# Patient Record
Sex: Male | Born: 1945
Health system: Southern US, Community
[De-identification: ages and names within clinical notes are randomized; demographics above are authoritative.]

## PROBLEM LIST (undated history)

## (undated) DIAGNOSIS — T827XXA Infection and inflammatory reaction due to other cardiac and vascular devices, implants and grafts, initial encounter: Secondary | ICD-10-CM

## (undated) DIAGNOSIS — I5022 Chronic systolic (congestive) heart failure: Secondary | ICD-10-CM

## (undated) DIAGNOSIS — I428 Other cardiomyopathies: Secondary | ICD-10-CM

## (undated) DIAGNOSIS — I10 Essential (primary) hypertension: Secondary | ICD-10-CM

## (undated) DIAGNOSIS — E119 Type 2 diabetes mellitus without complications: Secondary | ICD-10-CM

## (undated) DIAGNOSIS — I447 Left bundle-branch block, unspecified: Secondary | ICD-10-CM

## (undated) DIAGNOSIS — E785 Hyperlipidemia, unspecified: Secondary | ICD-10-CM

## (undated) DIAGNOSIS — J189 Pneumonia, unspecified organism: Secondary | ICD-10-CM

## (undated) DIAGNOSIS — H9191 Unspecified hearing loss, right ear: Secondary | ICD-10-CM

## (undated) DIAGNOSIS — I2510 Atherosclerotic heart disease of native coronary artery without angina pectoris: Secondary | ICD-10-CM

## (undated) DIAGNOSIS — I4891 Unspecified atrial fibrillation: Secondary | ICD-10-CM

## (undated) HISTORY — PX: OTHER SURGICAL HISTORY: SHX169

## (undated) HISTORY — DX: Infection and inflammatory reaction due to other cardiac and vascular devices, implants and grafts, initial encounter: T82.7XXA

## (undated) HISTORY — PX: CARDIAC DEFIBRILLATOR PLACEMENT: SHX171

## (undated) HISTORY — PX: APPENDECTOMY: SHX54

## (undated) HISTORY — DX: Essential (primary) hypertension: I10

## (undated) HISTORY — DX: Left bundle-branch block, unspecified: I44.7

## (undated) HISTORY — DX: Type 2 diabetes mellitus without complications: E11.9

## (undated) HISTORY — DX: Other cardiomyopathies: I42.8

## (undated) HISTORY — PX: CHOLECYSTECTOMY: SHX55

## (undated) HISTORY — DX: Chronic systolic (congestive) heart failure: I50.22

## (undated) HISTORY — DX: Unspecified atrial fibrillation: I48.91

## (undated) HISTORY — DX: Atherosclerotic heart disease of native coronary artery without angina pectoris: I25.10

## (undated) HISTORY — PX: HERNIA REPAIR: SHX51

---

## 2007-08-05 ENCOUNTER — Ambulatory Visit: Payer: Self-pay | Admitting: Cardiovascular Disease

## 2007-08-05 ENCOUNTER — Ambulatory Visit: Payer: Self-pay | Admitting: Cardiology

## 2007-08-05 ENCOUNTER — Inpatient Hospital Stay (HOSPITAL_COMMUNITY): Admission: AD | Admit: 2007-08-05 | Discharge: 2007-08-08 | Payer: Self-pay | Admitting: Cardiology

## 2007-08-24 ENCOUNTER — Ambulatory Visit: Payer: Self-pay | Admitting: Cardiology

## 2007-09-11 ENCOUNTER — Ambulatory Visit: Payer: Self-pay | Admitting: Cardiovascular Disease

## 2007-09-11 ENCOUNTER — Ambulatory Visit (HOSPITAL_COMMUNITY): Admission: RE | Admit: 2007-09-11 | Discharge: 2007-09-11 | Payer: Self-pay | Admitting: Cardiology

## 2007-11-16 ENCOUNTER — Ambulatory Visit: Payer: Self-pay | Admitting: Cardiology

## 2007-11-16 ENCOUNTER — Encounter: Payer: Self-pay | Admitting: Cardiology

## 2007-12-10 ENCOUNTER — Ambulatory Visit: Payer: Self-pay | Admitting: Internal Medicine

## 2007-12-10 ENCOUNTER — Encounter: Payer: Self-pay | Admitting: Cardiology

## 2007-12-10 ENCOUNTER — Ambulatory Visit: Payer: Self-pay

## 2007-12-10 LAB — CONVERTED CEMR LAB
Calcium: 9.1 mg/dL (ref 8.4–10.5)
Chloride: 99 meq/L (ref 96–112)
Creatinine, Ser: 1 mg/dL (ref 0.4–1.5)
Glucose, Bld: 115 mg/dL — ABNORMAL HIGH (ref 70–99)
INR: 1.1 — ABNORMAL HIGH (ref 0.8–1.0)
Monocytes Absolute: 0.6 10*3/uL (ref 0.1–1.0)
Neutro Abs: 3.2 10*3/uL (ref 1.4–7.7)
Neutrophils Relative %: 57.7 % (ref 43.0–77.0)
Platelets: 228 10*3/uL (ref 150–400)
Potassium: 3.9 meq/L (ref 3.5–5.1)
Prothrombin Time: 13.1 s (ref 10.9–13.3)
RDW: 14.9 % — ABNORMAL HIGH (ref 11.5–14.6)
aPTT: 27.7 s (ref 21.7–29.8)

## 2007-12-14 ENCOUNTER — Ambulatory Visit: Payer: Self-pay | Admitting: Internal Medicine

## 2007-12-14 ENCOUNTER — Inpatient Hospital Stay (HOSPITAL_COMMUNITY): Admission: AD | Admit: 2007-12-14 | Discharge: 2007-12-15 | Payer: Self-pay | Admitting: Internal Medicine

## 2007-12-31 ENCOUNTER — Ambulatory Visit: Payer: Self-pay

## 2007-12-31 LAB — CONVERTED CEMR LAB
BUN: 17 mg/dL (ref 6–23)
CO2: 26 meq/L (ref 19–32)
Calcium: 8.9 mg/dL (ref 8.4–10.5)
Chloride: 99 meq/L (ref 96–112)
Creatinine, Ser: 1 mg/dL (ref 0.4–1.5)
Potassium: 4.1 meq/L (ref 3.5–5.1)

## 2008-01-07 ENCOUNTER — Ambulatory Visit: Payer: Self-pay

## 2008-01-14 ENCOUNTER — Ambulatory Visit: Payer: Self-pay | Admitting: Cardiology

## 2008-01-14 ENCOUNTER — Encounter: Payer: Self-pay | Admitting: Physician Assistant

## 2008-02-02 ENCOUNTER — Encounter: Payer: Self-pay | Admitting: Cardiology

## 2008-04-01 ENCOUNTER — Ambulatory Visit: Payer: Self-pay | Admitting: Internal Medicine

## 2008-06-20 ENCOUNTER — Ambulatory Visit: Payer: Self-pay | Admitting: Internal Medicine

## 2008-09-19 ENCOUNTER — Ambulatory Visit: Payer: Self-pay | Admitting: Internal Medicine

## 2008-11-01 ENCOUNTER — Encounter: Payer: Self-pay | Admitting: Internal Medicine

## 2008-12-19 ENCOUNTER — Ambulatory Visit: Payer: Self-pay | Admitting: Internal Medicine

## 2008-12-23 ENCOUNTER — Encounter: Payer: Self-pay | Admitting: Internal Medicine

## 2008-12-28 ENCOUNTER — Ambulatory Visit: Payer: Self-pay | Admitting: Cardiology

## 2008-12-29 ENCOUNTER — Encounter: Payer: Self-pay | Admitting: Cardiology

## 2009-01-18 ENCOUNTER — Encounter (INDEPENDENT_AMBULATORY_CARE_PROVIDER_SITE_OTHER): Payer: Self-pay | Admitting: *Deleted

## 2009-02-14 ENCOUNTER — Telehealth (INDEPENDENT_AMBULATORY_CARE_PROVIDER_SITE_OTHER): Payer: Self-pay | Admitting: *Deleted

## 2009-03-28 DIAGNOSIS — I5022 Chronic systolic (congestive) heart failure: Secondary | ICD-10-CM

## 2009-03-28 DIAGNOSIS — I2510 Atherosclerotic heart disease of native coronary artery without angina pectoris: Secondary | ICD-10-CM | POA: Insufficient documentation

## 2009-03-28 DIAGNOSIS — I447 Left bundle-branch block, unspecified: Secondary | ICD-10-CM

## 2009-03-28 DIAGNOSIS — I420 Dilated cardiomyopathy: Secondary | ICD-10-CM

## 2009-03-31 ENCOUNTER — Ambulatory Visit: Payer: Self-pay | Admitting: Internal Medicine

## 2009-03-31 DIAGNOSIS — E119 Type 2 diabetes mellitus without complications: Secondary | ICD-10-CM

## 2009-03-31 DIAGNOSIS — I4891 Unspecified atrial fibrillation: Secondary | ICD-10-CM | POA: Insufficient documentation

## 2009-04-27 ENCOUNTER — Ambulatory Visit: Admission: RE | Admit: 2009-04-27 | Discharge: 2009-04-27 | Payer: Self-pay | Admitting: Family Medicine

## 2009-05-03 ENCOUNTER — Ambulatory Visit (HOSPITAL_COMMUNITY): Admission: RE | Admit: 2009-05-03 | Discharge: 2009-05-03 | Payer: Self-pay | Admitting: Family Medicine

## 2009-05-03 ENCOUNTER — Ambulatory Visit: Payer: Self-pay | Admitting: Cardiology

## 2009-05-04 ENCOUNTER — Ambulatory Visit: Payer: Self-pay | Admitting: Vascular Surgery

## 2009-05-09 ENCOUNTER — Telehealth: Payer: Self-pay | Admitting: Internal Medicine

## 2009-07-24 ENCOUNTER — Encounter: Payer: Self-pay | Admitting: Cardiology

## 2009-08-25 ENCOUNTER — Ambulatory Visit: Payer: Self-pay | Admitting: Cardiology

## 2009-08-25 ENCOUNTER — Ambulatory Visit: Payer: Self-pay | Admitting: Internal Medicine

## 2009-08-25 DIAGNOSIS — R413 Other amnesia: Secondary | ICD-10-CM

## 2009-10-27 ENCOUNTER — Ambulatory Visit: Payer: Self-pay | Admitting: Internal Medicine

## 2010-04-23 ENCOUNTER — Encounter (INDEPENDENT_AMBULATORY_CARE_PROVIDER_SITE_OTHER): Payer: Self-pay | Admitting: *Deleted

## 2010-06-19 ENCOUNTER — Ambulatory Visit: Payer: Self-pay | Admitting: Cardiology

## 2010-06-22 ENCOUNTER — Encounter: Payer: Self-pay | Admitting: Cardiology

## 2010-07-25 ENCOUNTER — Ambulatory Visit: Payer: Self-pay | Admitting: Internal Medicine

## 2010-08-09 ENCOUNTER — Telehealth (INDEPENDENT_AMBULATORY_CARE_PROVIDER_SITE_OTHER): Payer: Self-pay | Admitting: *Deleted

## 2010-09-04 NOTE — Cardiovascular Report (Signed)
Summary: Office Visit   Office Visit   Imported By: Roderic Ovens 11/10/2009 13:24:55  _____________________________________________________________________  External Attachment:    Type:   Image     Comment:   External Document

## 2010-09-04 NOTE — Assessment & Plan Note (Signed)
Summary: 6 MO FU PER DEC REMINDER-SRS   Visit Type:  Follow-up Primary Provider:  Neita Carp  CC:  follow-up visit.  History of Present Illness: the patient is a 65 year old male with a nonischemic cardiomyopathy. The patient is status post CRT-P implantation. During his last clinic visit in the electrophysiology clinic interrogation of the device revealed episodes of atrial fibrillation. However today, no episodes of atrial fibrillation were recorded on device interrogation. The patient also reported previously tingling in left arm weakness. Dr. Graciela Husbands was concerned the patient had a TIA and mentioned that he may need Coumadin. However no further action was taken latter recommendation. The patient was seen by neurology and CT scan of the brain was normal as well as normal carotid Dopplers. The patient's status overall doing well. Is in NYHA class IIb. He does get fatigued and complains off a decrease in memory. He denies any palpitations, presyncope or syncope.  Preventive Screening-Counseling & Management  Alcohol-Tobacco     Smoking Status: quit     Year Quit: 1967  Clinical Review Panels:  Echocardiogram Echocardiogram   1. Left ventricle: The cavity size was normal. Wall thickness was      normal. Systolic function was moderately to severely reduced. The      estimated ejection fraction was in the range of 30% to 35%.      Global hypokinesis. Doppler parameters are consistent with      abnormal left ventricular relaxation (grade 1 diastolic      dysfunction).   2. Aortic valve: There was no stenosis.   3. Mitral valve: Mild regurgitation.   4. Left atrium: The atrium was mildly dilated.   5. Right ventricle: The cavity size was normal. Pacer wire or      catheter noted in right ventricle. Systolic function was normal.   6. Pulmonary arteries: No complete TR doppler jet, unable to      estimate PA systolic pressure.   7. Inferior vena cava: The vessel was normal in size; the  respirophasic diameter changes were in the normal range (= 50%);      findings are consistent with normal central venous pressure.   Impressions:    - The patient was in normal sinus rhythm. Normal LV size with     moderate to severe systolic dysfunction, EF 30-35%. There is     diffuse global hypokinesis. This appears to be an improvement from     2009. The RV appears normal. (05/03/2009)  Cardiac Imaging Cardiac Cath Findings  CONCLUSION:  Severe nonischemic cardiomyopathy.  Mild coronary plaque.   Mildly elevated pulmonary pressures with RV pressures being relatively   normal.  There was no evidence on the RV tracing of a restrictive   physiology.      PLAN:  The patient will have medical management.  Will continue to look   for etiologies of his nonischemic cardiomyopathy.  Will consider MRI and   thyroid and studies.      Rollene Rotunda, MD, [facility]  (08/07/2007)    Current Medications (verified): 1)  Lisinopril 5 Mg Tabs (Lisinopril) .... Take One Tablet By Mouth Daily 2)  Digoxin 0.125 Mg Tabs (Digoxin) .... Take One Tablet By Mouth Daily 3)  Simvastatin 40 Mg Tabs (Simvastatin) .... Take One Tablet By Mouth Daily At Bedtime 4)  Aspir-Trin 325 Mg Tbec (Aspirin) .... Take 1 Tablet By Mouth Once A Day 5)  Carvedilol 25 Mg Tabs (Carvedilol) .... Take One Tablet By Mouth Twice A Day 6)  Lasix 40 Mg Tabs (Furosemide) .... Take 1/2 Tablet By Mouth Twice A Day  Allergies (verified): 1)  ! Pcn 2)  ! * Tetanus 3)  Spironolactone (Spironolactone)  Comments:  Nurse/Medical Assistant: The patient's medications and allergies were reviewed with the patient and were updated in the Medication and Allergy Lists. Bottles reviewed.  Past History:  Past Surgical History: Last updated: 03/28/2009 Hernia Fiscula Hearing Cholecystectomy ICD - 12/14/07 - [employer] Jude Promote RF 3207 ICD - for CHF  Family History: Last updated: 03/28/2009 Family History of Cancer:  Family History of  Coronary Artery Disease:  Family History of Diabetes:   Social History: Last updated: 03/28/2009 Married  Tobacco Use - Former.  Alcohol Use - no Regular Exercise - no Drug Use - no Retired   Risk Factors: Smoking Status: quit (08/25/2009)  Past Medical History: Reviewed history from 03/28/2009 and no changes required. CAD, NATIVE VESSEL (ICD-414.01) SYSTOLIC HEART FAILURE, CHRONIC (ICD-428.22) LBBB (ICD-426.3) CARDIOMYOPATHY, SECONDARY (ICD-425.9)    Review of Systems  The patient denies fatigue, malaise, fever, weight gain/loss, vision loss, decreased hearing, hoarseness, chest pain, palpitations, shortness of breath, prolonged cough, wheezing, sleep apnea, coughing up blood, abdominal pain, blood in stool, nausea, vomiting, diarrhea, heartburn, incontinence, blood in urine, muscle weakness, joint pain, leg swelling, rash, skin lesions, headache, fainting, dizziness, depression, anxiety, enlarged lymph nodes, easy bruising or bleeding, and environmental allergies.         memory loss left arm numbness.  Vital Signs:  Patient profile:   65 year old male Height:      72 inches Weight:      225 pounds Pulse rate:   72 / minute BP sitting:   102 / 69  (left arm) Cuff size:   large  Vitals Entered By: Carlye Grippe (August 25, 2009 9:07 AM) CC: follow-up visit   Physical Exam  Additional Exam:  General: Well-developed, well-nourished in no distress head: Normocephalic and atraumatic eyes PERRLA/EOMI intact, conjunctiva and lids normal nose: No deformity or lesions mouth normal dentition, normal posterior pharynx neck: Supple, no JVD.  No masses, thyromegaly or abnormal cervical nodes. No carotid bruits. lungs: Normal breath sounds bilaterally without wheezing.  Normal percussion heart: regular rate and rhythm with normal S1 and S2, no S3 or S4.  PMI is normal.  No pathological murmurs abdomen: Normal bowel sounds, abdomen is soft and nontender without masses,  organomegaly or hernias noted.  No hepatosplenomegaly musculoskeletal: Back normal, normal gait muscle strength and tone normal pulsus: Pulse is normal in all 4 extremities Extremities: No peripheral pitting edema neurologic: Alert and oriented x 3 skin: Intact without lesions or rashes cervical nodes: No significant adenopathy psychologic: Normal affect    Echocardiogram  Procedure date:  05/03/2009  Findings:        1. Left ventricle: The cavity size was normal. Wall thickness was      normal. Systolic function was moderately to severely reduced. The      estimated ejection fraction was in the range of 30% to 35%.      Global hypokinesis. Doppler parameters are consistent with      abnormal left ventricular relaxation (grade 1 diastolic      dysfunction).   2. Aortic valve: There was no stenosis.   3. Mitral valve: Mild regurgitation.   4. Left atrium: The atrium was mildly dilated.   5. Right ventricle: The cavity size was normal. Pacer wire or      catheter noted in right ventricle. Systolic function  was normal.   6. Pulmonary arteries: No complete TR doppler jet, unable to      estimate PA systolic pressure.   7. Inferior vena cava: The vessel was normal in size; the      respirophasic diameter changes were in the normal range (= 50%);      findings are consistent with normal central venous pressure.   Impressions:    - The patient was in normal sinus rhythm. Normal LV size with     moderate to severe systolic dysfunction, EF 30-35%. There is     diffuse global hypokinesis. This appears to be an improvement from     2009. The RV appears normal.    ICD Specifications Following MD:  Sherryl Manges, MD     ICD Vendor:  [facility] Jude     ICD Model Number:  [id number]     ICD Serial Number:  [id number] ICD DOI:  12/14/2007     ICD Implanting MD:  Sherryl Manges, MD  Lead 1:    Location: RA     DOI: 12/14/2007     Model #: [id number]     Serial #: [id number]     Status: active Lead 2:     Location: RV     DOI: 12/14/2007     Model #: 2956     Serial #: [id number]     Status: active Lead 3:    Location: LV     DOI: 12/14/2007     Model #: [id number]     Serial #: [id number]     Status: active  Indications::  NICM, CHF   ICD Follow Up ICD Dependent:  No       ICD Device Measurements Configuration: LV TIP-RV COIL  Episodes Coumadin:  No  Brady Parameters Mode DDD     Lower Rate Limit:  60     Upper Rate Limit 120 PAV 160     Sensed AV Delay:  110  Tachy Zones VF:  240     VT:  200     Impression & Recommendations:  Problem # 1:  ATRIAL FIBRILLATION, PAROXYSMAL (ICD-427.31) no further episodes of atrial fibrillation on device interrogation. No clear indication for Coumadin at the present time particularly absence of any cardioembolic events. His updated medication list for this problem includes:    Digoxin 0.125 Mg Tabs (Digoxin) .Marland Kitchen... Take one tablet by mouth daily    Aspir-trin 325 Mg Tbec (Aspirin) .Marland Kitchen... Take 1 tablet by mouth once a day    Carvedilol 25 Mg Tabs (Carvedilol) .Marland Kitchen... Take one tablet by mouth twice a day  Orders: EKG w/ Interpretation (93000)  Problem # 2:  MEMORY LOSS (ICD-780.93) the patient reports memory loss. He also is a sensory neuropathy. Both could be explained by the use of statin drugs. We'll recommend to hold simvastatin for several weeks, particularly because the patient does not have an ischemic myopathy.and benefit from statin drug therapy is minimal in this situation.  Problem # 3:  SYSTOLIC HEART FAILURE, CHRONIC (ICD-428.22) the patient has no evidence of volume overload. He can continue his current medical regimen. The patient's also requiring a small dose of Lasix. His ejection fraction is 30-35% from 15-20%. It's unclear is an improvement secondary to CRT or optimal drug management. His updated medication list for this problem includes:    Lisinopril 5 Mg Tabs (Lisinopril) .Marland Kitchen... Take one tablet by mouth daily    Digoxin 0.125 Mg Tabs  (Digoxin) .Marland Kitchen... Take one tablet by  mouth daily    Aspir-trin 325 Mg Tbec (Aspirin) .Marland Kitchen... Take 1 tablet by mouth once a day    Carvedilol 25 Mg Tabs (Carvedilol) .Marland Kitchen... Take one tablet by mouth twice a day    Lasix 40 Mg Tabs (Furosemide) .Marland Kitchen... Take 1/2 tablet by mouth twice a day  Problem # 4:  CRT-D STJ (ICD-V45.02)  Patient Instructions: 1)  Your physician recommends that you continue on your current medications as directed. Please refer to the Current Medication list given to you today 2)  Follow up in 1 year.  Prescriptions: LASIX 40 MG TABS (FUROSEMIDE) Take 1/2 tablet by mouth twice a day  #180 x 3   Entered by:   Hoover Brunette, LPN   Authorized by:   Lewayne Bunting, MD, [facility]   Signed by:   Hoover Brunette, LPN on 09/81/1914   Method used:   Electronically to        [facility]* (mail-order)             ,          Ph: [phone]       Fax: [phone]   RxID:   [id number] CARVEDILOL 25 MG TABS (CARVEDILOL) Take one tablet by mouth twice a day  #180 x 3   Entered by:   Hoover Brunette, LPN   Authorized by:   Lewayne Bunting, MD, [facility]   Signed by:   Hoover Brunette, LPN on 53/66/4403   Method used:   Electronically to        [facility]* (mail-order)             ,          Ph: [phone]       Fax: [phone]   RxID:   [id number] SIMVASTATIN 40 MG TABS (SIMVASTATIN) Take one tablet by mouth daily at bedtime  #90 x 3   Entered by:   Hoover Brunette, LPN   Authorized by:   Lewayne Bunting, MD, [facility]   Signed by:   Hoover Brunette, LPN on 32/35/5732   Method used:   Electronically to        [facility]* (mail-order)             ,          Ph: [phone]       Fax: [phone]   RxID:   [id number] DIGOXIN 0.125 MG TABS (DIGOXIN) Take one tablet by mouth daily  #90 x 3   Entered by:   Hoover Brunette, LPN   Authorized by:   Lewayne Bunting, MD, [facility]   Signed by:   Hoover Brunette, LPN on 46/27/0350   Method used:   Electronically to        [facility]* (mail-order)             ,           Ph: [phone]       Fax: [phone]   RxID:   [id number] LISINOPRIL 5 MG TABS (LISINOPRIL) Take one tablet by mouth daily  #90 x 3   Entered by:   Hoover Brunette, LPN   Authorized by:   Lewayne Bunting, MD, [facility]   Signed by:   Hoover Brunette, LPN on 42/35/3614   Method used:   Electronically to        [facility] (mail-order)             ,          Ph:  [phone]       Fax: [phone]   RxID:   [id number]    Appended Document: 6 MO FU PER DEC REMINDER-SRS Gayle,could you call patient and let him know to stop simvastatin for couple of weeks and see if his memory loss gets better.also, his numbness may get better. He needs to call back in a couple of weeks and let us know, per Dr. Andee Lineman.  Appended Document: 6 MO FU PER DEC REMINDER-SRS Pt. and wife notified.

## 2010-09-04 NOTE — Medication Information (Signed)
Summary: RX Folder/ FAXED LISINOPRIL  RX Folder/ FAXED LISINOPRIL   Imported By: Dorise Hiss 06/26/2010 15:26:23  _____________________________________________________________________  External Attachment:    Type:   Image     Comment:   External Document

## 2010-09-04 NOTE — Assessment & Plan Note (Signed)
Summary: needs to discuss meds   Visit Type:  Follow-up Primary Caylor Cerino:  Sasser   History of Present Illness: the patient is a 65 year old male with a nonischemic cardiomyopathy. The patient is status post CRT-P implantation. During his last clinic visit in the electrophysiology clinic interrogation of the device revealed episodes of atrial fibrillation. Low risk for tghromboembolic disease.  patient had L. statin therapy but did not notice any difference and so he has restarted back on this. Is on a good heart failure regimen.EKG shows normal sinus rhythm with biventricular pacing. Still tires easily. Takes a nap once in a while. Feels has better blood flow.device check in December. Has diabetes. Will start on metformin. BS very high. On salt substitute.  Snore louds. Stops breathing sometimes.  Refuses sleep study.  Preventive Screening-Counseling & Management  Alcohol-Tobacco     Smoking Status: quit     Year Quit: 1967  Current Medications (verified): 1)  Lisinopril 10 Mg Tabs (Lisinopril) .... Take 1 Tablet By Mouth Once A Day 2)  Digoxin 0.125 Mg Tabs (Digoxin) .... Take One Tablet By Mouth Daily 3)  Simvastatin 40 Mg Tabs (Simvastatin) .... Take One Tablet By Mouth Daily At Bedtime 4)  Aspir-Trin 81 Mg Tbec (Aspirin) .... Take 1 Tablet By Mouth Once A Day 5)  Carvedilol 25 Mg Tabs (Carvedilol) .... Take One Tablet By Mouth Twice A Day 6)  Lasix 40 Mg Tabs (Furosemide) .... Take 1/2 Tablet By Mouth Twice A Day 7)  [facility] Cinnamon 500 Mg Caps (Cinnamon) .... Take 2 Tablet By Mouth Once A Day 8)  Vitamin B-12 500 Mcg Tabs (Cyanocobalamin) .... Take 1 Tablet By Mouth Once A Day  Allergies (verified): 1)  ! Pcn  Comments:  Nurse/Medical Assistant: The patient's medication bottles and allergies were reviewed with the patient and were updated in the Medication and Allergy Lists.  Past History:  Past Medical History: Last updated: 03/28/2009 CAD, NATIVE VESSEL  (ICD-414.01) SYSTOLIC HEART FAILURE, CHRONIC (ICD-428.22) LBBB (ICD-426.3) CARDIOMYOPATHY, SECONDARY (ICD-425.9)    Past Surgical History: Last updated: 03/28/2009 Hernia Fiscula Hearing Cholecystectomy ICD - 12/14/07 - [employer]. Jude Promote RF 3207 ICD - for CHF  Family History: Last updated: 03/28/2009 Family History of Cancer:  Family History of Coronary Artery Disease:  Family History of Diabetes:   Social History: Last updated: 03/28/2009 Married  Tobacco Use - Former.  Alcohol Use - no Regular Exercise - no Drug Use - no Retired   Vital Signs:  Patient profile:   65 year old male Height:      72 inches Weight:      213 pounds BMI:     28.99 Pulse rate:   79 / minute BP sitting:   120 / 80  (left arm) Cuff size:   large  Vitals Entered By: Carlye Grippe (June 19, 2010 1:09 PM)  Nutrition Counseling: Patient's BMI is greater than 25 and therefore counseled on weight management options.  Physical Exam  Additional Exam:  General: Well-developed, well-nourished in no distress head: Normocephalic and atraumatic eyes PERRLA/EOMI intact, conjunctiva and lids normal nose: No deformity or lesions mouth normal dentition, normal posterior pharynx neck: Supple, no JVD.  No masses, thyromegaly or abnormal cervical nodes. No carotid bruits. lungs: Normal breath sounds bilaterally without wheezing.  Normal percussion heart: regular rate and rhythm with normal S1 and S2, no S3 or S4.  PMI is normal.  No pathological murmurs abdomen: Normal bowel sounds, abdomen is soft and nontender without masses, organomegaly or hernias  noted.  No hepatosplenomegaly musculoskeletal: Back normal, normal gait muscle strength and tone normal pulsus: Pulse is normal in all 4 extremities Extremities: No peripheral pitting edema neurologic: Alert and oriented x 3 skin: Intact without lesions or rashes cervical nodes: No significant adenopathy psychologic: Normal affect     ICD  Specifications Following MD:  Hillis Range, MD     Referring MD:  [facility] ICD Vendor:  [employer] Jude     ICD Model Number:  [phone]     ICD Serial Number:  [id number] ICD DOI:  12/14/2007     ICD Implanting MD:  Sherryl Manges, MD  Lead 1:    Location: RA     DOI: 12/14/2007     Model #: [id number]     Serial #: [id number]     Status: active Lead 2:    Location: RV     DOI: 12/14/2007     Model #: 2956     Serial #: [id number]     Status: active Lead 3:    Location: LV     DOI: 12/14/2007     Model #: 8469     Serial #: [id number]     Status: active  Indications::  NICM, CHF   ICD Follow Up ICD Dependent:  No       ICD Device Measurements Configuration: LV TIP-RV COIL  Episodes Coumadin:  No  Brady Parameters Mode DDD     Lower Rate Limit:  60     Upper Rate Limit 120 PAV 160     Sensed AV Delay:  110  Tachy Zones VF:  240     VT:  200     Impression & Recommendations:  Problem # 1:  CRT-D STJ (ICD-V45.02) patient is status-post CRT-D.  Is doing well.  Is on a good heart regimen.  Problem # 2:  CAD, NATIVE VESSEL (ICD-414.01) no recurrent chest pain.  Continue medical therapy. His updated medication list for this problem includes:    Lisinopril 10 Mg Tabs (Lisinopril) .Marland Kitchen... Take 1 tablet by mouth once a day    Carvedilol 25 Mg Tabs (Carvedilol) .Marland Kitchen... Take one tablet by mouth twice a day  Problem # 3:  ATRIAL FIBRILLATION, PAROXYSMAL (ICD-427.31) patient had a history of paroxysmal atrial fibrillation but remains in normal sinus rhythm.  Currently no immediate indication for Coumadin His updated medication list for this problem includes:    Digoxin 0.125 Mg Tabs (Digoxin) .Marland Kitchen... Take one tablet by mouth daily    Carvedilol 25 Mg Tabs (Carvedilol) .Marland Kitchen... Take one tablet by mouth twice a day  Orders: EKG w/ Interpretation (93000)  Patient Instructions: 1)  Increase Lisinopril to 10mg  daily 2)  Follow up in  6 months Prescriptions: LISINOPRIL 10 MG TABS (LISINOPRIL) Take 1 tablet by mouth once a  day  #90 x 3   Entered by:   Hoover Brunette, LPN   Authorized by:   Lewayne Bunting, MD, [facility]   Signed by:   Hoover Brunette, LPN on 41/32/4401   Method used:   Faxed to ...       [facility]* (retail)             ,          Ph: [phone]       Fax: [phone]   RxID:   [id number] LASIX 40 MG TABS (FUROSEMIDE) Take 1/2 tablet by mouth twice a day  #90 x 3   Entered by:   Hoover Brunette, LPN  Authorized by:   Lewayne Bunting, MD, [facility]   Signed by:   Hoover Brunette, LPN on 16/05/9603   Method used:   Faxed to ...       [facility]* (retail)             ,          Ph: [phone]       Fax: [phone]   RxID:   [id number] CARVEDILOL 25 MG TABS (CARVEDILOL) Take one tablet by mouth twice a day  #180 x 3   Entered by:   Hoover Brunette, LPN   Authorized by:   Lewayne Bunting, MD, [facility]   Signed by:   Hoover Brunette, LPN on 32/44/0102   Method used:   Faxed to ...       [facility]* (retail)             ,          Ph: [phone]       Fax: [phone]   RxID:   [id number] ASPIR-TRIN 81 MG TBEC (ASPIRIN) Take 1 tablet by mouth once a day  #90 x 3   Entered by:   Hoover Brunette, LPN   Authorized by:   Lewayne Bunting, MD, [facility]   Signed by:   Hoover Brunette, LPN on 02/02/1600   Method used:   Faxed to ...       [facility]* (retail)             ,          Ph: [phone]       Fax: [phone]   RxID:   [id number] SIMVASTATIN 40 MG TABS (SIMVASTATIN) Take one tablet by mouth daily at bedtime  #90 x 3   Entered by:   Hoover Brunette, LPN   Authorized by:   Lewayne Bunting, MD, [facility]   Signed by:   Hoover Brunette, LPN on 71/01/2693   Method used:   Faxed to ...       [facility] (retail)             ,          Ph: [phone]       Fax: [phone]   RxID:   [id number] DIGOXIN 0.125 MG TABS (DIGOXIN) Take one tablet by mouth daily  #90 x 3   Entered by:   Hoover Brunette, LPN   Authorized by:   Lewayne Bunting, MD, [facility]   Signed by:   Hoover Brunette, LPN on 02/58/5277   Method used:   Faxed to ...        [facility]* (retail)             ,          Ph: [phone]       Fax: [phone]   RxID:   [id number] LISINOPRIL 5 MG TABS (LISINOPRIL) Take one tablet by mouth daily  #90 x 3   Entered by:   Hoover Brunette, LPN   Authorized by:   Lewayne Bunting, MD, [facility]   Signed by:   Hoover Brunette, LPN on 99/83/3825   Method used:   Faxed to ...       [facility] (retail)             ,          Ph: [phone]       Fax: [phone]   RxID:   [id number]

## 2010-09-04 NOTE — Letter (Signed)
Summary: Device-Delinquent Check  [facility], Main Office  [address]   [place], [place] [address]   Phone: [phone]  Fax: [phone]     April 23, 2010 MRN: [id number]   [name] [address] [place], [place]  [address]   Dear [name],  According to our records, you have not had your implanted device checked in the recommended period of time.  We are unable to determine appropriate device function without checking your device on a regular basis.  Please call our office to schedule an appointment, with Dr. Johney Frame in [place],  as soon as possible.  If you are having your device checked by another physician, please call us so that we may update our records.  Thank you, Altha Harm, LPN  April 23, 2010 10:39 AM   [facility]

## 2010-09-04 NOTE — Cardiovascular Report (Signed)
Summary: Office Visit   Office Visit   Imported By: Roderic Ovens 09/12/2009 15:38:13  _____________________________________________________________________  External Attachment:    Type:   Image     Comment:   External Document

## 2010-09-04 NOTE — Procedures (Signed)
Summary: Cardiology Device Clinic   Current Medications (verified): 1)  Lisinopril 5 Mg Tabs (Lisinopril) .... Take One Tablet By Mouth Daily 2)  Digoxin 0.125 Mg Tabs (Digoxin) .... Take One Tablet By Mouth Daily 3)  Simvastatin 40 Mg Tabs (Simvastatin) .... Take One Tablet By Mouth Daily At Bedtime 4)  Aspir-Trin 81 Mg Tbec (Aspirin) .... Take 1 Tablet By Mouth Once A Day 5)  Carvedilol 25 Mg Tabs (Carvedilol) .... Take One Tablet By Mouth Twice A Day 6)  Lasix 40 Mg Tabs (Furosemide) .... Take 1/2 Tablet By Mouth Twice A Day  Allergies: 1)  ! Pcn   ICD Specifications Following MD:  Hillis Range, MD     Referring MD:  [facility] ICD Vendor:  St Jude     ICD Model Number:  [phone]     ICD Serial Number:  [id number] ICD DOI:  12/14/2007     ICD Implanting MD:  Sherryl Manges, MD  Lead 1:    Location: RA     DOI: 12/14/2007     Model #: [id number]     Serial #: [id number]     Status: active Lead 2:    Location: RV     DOI: 12/14/2007     Model #: 2956     Serial #: [id number]     Status: active Lead 3:    Location: LV     DOI: 12/14/2007     Model #: 8469     Serial #: [id number]     Status: active  Indications::  NICM, CHF   ICD Follow Up Remote Check?  No Battery Voltage:  3.10 V     Charge Time:  11.3 seconds     Battery Est. Longevity:  3.9 years Underlying rhythm:  SR ICD Dependent:  No       ICD Device Measurements Atrium:  Amplitude: 3.9 mV, Impedance: 410 ohms, Threshold: 0.5 V at 0.5 msec Right Ventricle:  Amplitude: 12 mV, Impedance: 700 ohms, Threshold: 0.5 V at 0.5 msec Left Ventricle:  Impedance: 380 ohms, Threshold: 1.75 V at 1.0 msec Configuration: LV TIP-RV COIL Shock Impedance: 53 ohms   Episodes MS Episodes:  0     Percent Mode Switch:  0     Coumadin:  No Shock:  0     ATP:  0     Nonsustained:  0     Atrial Therapies:  18%  Brady Parameters Mode DDD     Lower Rate Limit:  60     Upper Rate Limit 120 PAV 160     Sensed AV Delay:  110  Tachy Zones VF:  240     VT:  200      Next Cardiology Appt Due:  01/03/2010 Tech Comments:  No parameter changes.  Device function normal.  The patient was holding her Simvastatin because of some numbness in his legs.  He states he hadn't noticed a difference so he started back at the same dosage.  ROV 3 months in the [facility]. Altha Harm, LPN  October 27, 2009 10:56 AM

## 2010-09-04 NOTE — Procedures (Signed)
Summary: PACER CHECK   Current Medications (verified): 1)  Lisinopril 5 Mg Tabs (Lisinopril) .... Take One Tablet By Mouth Daily 2)  Digoxin 0.125 Mg Tabs (Digoxin) .... Take One Tablet By Mouth Daily 3)  Simvastatin 40 Mg Tabs (Simvastatin) .... Take One Tablet By Mouth Daily At Bedtime 4)  Aspirin 81 Mg Tabs (Aspirin) .... Take 1 Tablet By Mouth Once A Day 5)  Carvedilol 25 Mg Tabs (Carvedilol) .... Take One Tablet By Mouth Twice A Day 6)  Lasix 40 Mg Tabs (Furosemide) .... Take 1 Tablet By Mouth Twice A Day  Allergies (verified): 1)  ! Pcn 2)  ! * Tetanus 3)  Spironolactone (Spironolactone)   ICD Specifications Following MD:  Hillis Range, MD     Referring MD:  [facility] ICD Vendor:  St Jude     ICD Model Number:  [phone]     ICD Serial Number:  [id number] ICD DOI:  12/14/2007     ICD Implanting MD:  Sherryl Manges, MD  Lead 1:    Location: RA     DOI: 12/14/2007     Model #: [id number]     Serial #: [id number]     Status: active Lead 2:    Location: RV     DOI: 12/14/2007     Model #: 2956     Serial #: [id number]     Status: active Lead 3:    Location: LV     DOI: 12/14/2007     Model #: 8469     Serial #: [id number]     Status: active  Indications::  NICM, CHF   ICD Follow Up Remote Check?  No Battery Voltage:  3.14 V     Charge Time:  10.9 seconds     Underlying rhythm:  SR ICD Dependent:  No       ICD Device Measurements Atrium:  Amplitude: 4.4 mV, Impedance: 400 ohms, Threshold: 0.5 V at 0.5 msec Right Ventricle:  Amplitude: 12 mV, Impedance: 680 ohms, Threshold: 0.5 V at 0.5 msec Left Ventricle:  Impedance: 440 ohms, Threshold: 1.25 V at 0.5 msec Configuration: LV TIP-RV COIL Shock Impedance: 52 ohms   Episodes MS Episodes:  1     Percent Mode Switch:  <1%     Coumadin:  No Shock:  0     ATP:  0     Nonsustained:  0     Atrial Pacing:  25%     Ventricular Pacing:  >99%  Brady Parameters Mode DDD     Lower Rate Limit:  60     Upper Rate Limit 120 PAV 160     Sensed AV Delay:   110  Tachy Zones VF:  240     VT:  200     Next Cardiology Appt Due:  11/03/2009 Tech Comments:  Normal device function.  No changes made today.  ROV 3 months [facility] device clinic. Gypsy Balsam RN BSN  August 25, 2009 5:41 PM

## 2010-09-06 NOTE — Cardiovascular Report (Signed)
Summary: Office Visit   Office Visit   Imported By: Roderic Ovens 08/14/2010 13:59:58  _____________________________________________________________________  External Attachment:    Type:   Image     Comment:   External Document

## 2010-09-06 NOTE — Progress Notes (Signed)
Summary: Recieving appt. notice  Phone Note Call from Patient Call back at Home Phone [phone]   Details for Reason: APPT NOTICE Summary of Call: Patient called and ask [place] to stop sending notice to make 3 mo. fullowup with [facility]. He will come once a year or when needed. Initial call taken by: Dorise Hiss,  August 09, 2010 12:28 PM  Follow-up for Phone Call        Spoke with patient.  Informed him that his appointment reminder was from his initial visit a year ago & one reminder was for that.  But, he came in November 2011 to discuss med issues & was then told to f/u in 6 months from that visit.  The one year f/u was never cancelled.  Patient sees PMD every 3 months for his Diabetes, Dr. Johney Frame periodically, and [place].  Just trying to cut down on soo many visits all the time (co-pay, etc.. )  Informed him that he was not due to see GD until May of this year.  Had pt come back in 6 months this time instead of a year because med changes were made at last OV.   States that he would like to just come once a year, unless he was having problems.  Advised pt will let MD be aware of above.   Follow-up by: Hoover Brunette, LPN,  August 10, 2010 6:05 PM  Additional Follow-up for Phone Call Additional follow up Details #1::        once a year would be fine Additional Follow-up by: Lewayne Bunting, MD, [facility],  August 13, 2010 8:34 AM    Additional Follow-up for Phone Call Additional follow up Details #2::    All reminders cancelled for GD.  New reminder created for 11/12.  Follow-up by: Hoover Brunette, LPN,  August 13, 2010 5:51 PM

## 2010-09-06 NOTE — Procedures (Signed)
Summary: dfp   Current Medications (verified): 1)  Lisinopril 10 Mg Tabs (Lisinopril) .... Take 1 Tablet By Mouth Once A Day 2)  Digoxin 0.125 Mg Tabs (Digoxin) .... Take One Tablet By Mouth Daily 3)  Simvastatin 40 Mg Tabs (Simvastatin) .... Take One Tablet By Mouth Daily At Bedtime 4)  Aspir-Trin 81 Mg Tbec (Aspirin) .... Take 1 Tablet By Mouth Once A Day 5)  Carvedilol 25 Mg Tabs (Carvedilol) .... Take One Tablet By Mouth Twice A Day 6)  Lasix 40 Mg Tabs (Furosemide) .... Take 1/2 Tablet By Mouth Twice A Day 7)  Cvs Cinnamon 500 Mg Caps (Cinnamon) .... Take 2 Tablet By Mouth Once A Day 8)  Vitamin B-12 500 Mcg Tabs (Cyanocobalamin) .... Take 1 Tablet By Mouth Once A Day  Allergies (verified): 1)  ! Pcn   ICD Specifications Following MD:  Hillis Range, MD     Referring MD:  [facility] ICD Vendor:  St Jude     ICD Model Number:  [phone]     ICD Serial Number:  [id number] ICD DOI:  12/14/2007     ICD Implanting MD:  Sherryl Manges, MD  Lead 1:    Location: RA     DOI: 12/14/2007     Model #: 1688TC     Serial #: [id number]     Status: active Lead 2:    Location: RV     DOI: 12/14/2007     Model #: 7564     Serial #: [id number]     Status: active Lead 3:    Location: LV     DOI: 12/14/2007     Model #: 4166     Serial #: [id number]     Status: active  Indications::  NICM, CHF   ICD Follow Up Battery Voltage:  2.90 V     Charge Time:  12.0 seconds     Battery Est. Longevity:  3.3 yrs Underlying rhythm:  SR ICD Dependent:  No       ICD Device Measurements Atrium:  Amplitude: 5.0 mV, Impedance: 480 ohms, Threshold: 0.5 V at 0.5 msec Right Ventricle:  Amplitude: 12.0 mV, Impedance: 790 ohms, Threshold: 0.5 V at 0.5 msec Left Ventricle:  Impedance: 540 ohms, Threshold: 1.5  V at 1.0 msec Configuration: LV TIP-RV COIL Shock Impedance: 54 ohms   Episodes MS Episodes:  1     Percent Mode Switch:  <1%     Coumadin:  No Shock:  0     ATP:  0     Nonsustained:  0     Atrial Therapies:  0 Atrial Pacing:   17%     Ventricular Pacing:  >99%  Brady Parameters Mode DDD     Lower Rate Limit:  60     Upper Rate Limit 120 PAV 160     Sensed AV Delay:  110  Tachy Zones VF:  240     VT:  200     Next Cardiology Appt Due:  10/29/2010 Tech Comments:  1 AMS EPISODE LASTING 4 SECONDS.  NORMAL DEVICE FUNCTION.  CHANGED RA OUTPUT FROM 2.5 TO 2.0 AND TURNED ON RATE RESPONSE DURING MODE SWITCH.  ROV 10-29-10 @ 1430 W/JA IN [facility]. Vella Kohler  July 25, 2010 11:20 AM

## 2010-10-13 ENCOUNTER — Encounter: Payer: Self-pay | Admitting: *Deleted

## 2010-10-29 ENCOUNTER — Encounter: Payer: Self-pay | Admitting: Internal Medicine

## 2010-11-14 ENCOUNTER — Encounter: Payer: Self-pay | Admitting: *Deleted

## 2010-11-14 ENCOUNTER — Ambulatory Visit (INDEPENDENT_AMBULATORY_CARE_PROVIDER_SITE_OTHER): Payer: BLUE CROSS/BLUE SHIELD | Admitting: *Deleted

## 2010-11-14 DIAGNOSIS — I4891 Unspecified atrial fibrillation: Secondary | ICD-10-CM

## 2010-11-14 DIAGNOSIS — I429 Cardiomyopathy, unspecified: Secondary | ICD-10-CM

## 2010-11-14 NOTE — Progress Notes (Signed)
icd check in clinic  

## 2010-12-18 NOTE — Assessment & Plan Note (Signed)
[facility]                          [facility] NOTE   [name], [name]                     MRN:          [id number]  DATE:01/14/2008                            DOB:          1946/03/12    CARDIOLOGIST:  Learta Codding, MD, [facility].   PRIMARY CARE PHYSICIAN:  Dr. Fara Chute.   REASON FOR VISIT:  25-month followup.   HISTORY OF PRESENT ILLNESS:  [name] is a very pleasant 65 year old  male patient with a history of nonischemic cardiomyopathy with an EF of  15%, who recently underwent CRT/ICD implantation by Dr. Graciela Husbands with a St.  Jude device on Dec 14, 2007.  He presents to the office today for  followup.  Overall, he is doing well.  He describes NYHA class IIB  symptoms now.  He is sleeping on just 1 pillow.  He denies PND or  significant pedal edema.  Denies chest pain, syncope, or ICD discharges.  He does continue to complain of hoarseness and occasional cough.  He  sometimes feels like it is worse if he overeats.   CURRENT MEDICATIONS:  1. Furosemide 40 mg daily.  2. Aspirin 81 mg daily.  3. Spironolactone 25 mg daily.  4. Simvastatin 40 mg nightly.  5. Lisinopril 5 mg daily.  6. Carvedilol 3.125 mg b.i.d.  7. Digoxin 0.125 mg daily.   ALLERGIES:  PENICILLIN and TETANUS.   PHYSICAL EXAMINATION:  GENERAL:  He is well nourished, well developed,  in no acute distress.  VITAL SIGNS:  Blood pressure is 108/69, pulse 86, and weight 218.8  pounds.  HEENT:  Normal.  NECK:  Without JVD.  CARDIAC:  Normal S1 and S2.  Regular rate and rhythm.  LUNGS:  Clear to auscultation bilaterally.  ABDOMEN:  Soft and nontender.  Extremities:  No edema.  NEUROLOGIC:  He is alert and oriented x3.  Cranial nerves II-XII grossly  intact.   IMPRESSION:  1. Nonischemic cardiomyopathy with an ejection fraction of 15%.  2. Status post cardiac resynchronization therapy/implantable      cardioverter-defibrillator implantation with a St. Jude  device on      Dec 14, 2007.  3. Chronic systolic congestive heart failure with New York Heart      Association class IIB symptoms.  4. Minimal nonobstructive coronary artery disease by catheterization      in June 2009.  5. Chronic left bundle-branch block.  6. Hoarseness.   PLAN:  1. The patient is doing well from cardiovascular standpoint.  He has      felt poorly in the past and we have been unable to titrate      medications for his congestive heart failure.  Now that he is to      doing much better and is status post CRT device implantation, he      should be able to tolerate some increase in his medications.  We      will increase his carvedilol to 6.25 mg b.i.d.  2. He will need a BMET to follow up on his renal function, potassium      especially given  his ACE inhibitor and spironolactone therapy.  3. The patient continues to complain of hoarseness.  I think this is      possibly related to his ACE inhibitor.  However, he is restricted      with finances and would not be able to afford an angiotensin      receptor blocker.  He has a remote history of smoking.  He also      notes some history of what sounds like acid reflux disease.  I have      recommended that we refer him to ENT for further evaluation.  He is      agreeable to this, but needs to have it done before the end of the      month secondary to his Medicaid coverage.  I have also asked him to      start on ranitidine 150 mg b.i.d.  4. The patient would like to follow up in 6 months instead of 3 months      due to his financial constraints.  He is due to see Dr. Graciela Husbands in      August.  I will let him see Dr. Graciela Husbands in August and then we can see      him in 6 months from now.      [name], PA-C  Electronically Signed      Learta Codding, MD,FACC  Electronically Signed   SW/MedQ  DD: 01/14/2008  DT: 01/15/2008  Job #: [id number]   cc:   Fara Chute

## 2010-12-18 NOTE — Discharge Summary (Signed)
NAME:  [name], [name] NO.:  [id number]   MEDICAL RECORD NO.:  [id number]          PATIENT TYPE:  INP   LOCATION:  2005                         FACILITY:  [facility]   PHYSICIAN:  Duke Salvia, MD, FACCDATE OF BIRTH:  1946-01-22   DATE OF ADMISSION:  12/14/2007  DATE OF DISCHARGE:  12/15/2007                               DISCHARGE SUMMARY   ALLERGIES:  This patient has allergy to PENICILLIN.   Dictation and exam greater than 35 minutes.   FINAL DIAGNOSES:  1. Discharging day #1, status post implant of St. Jude PROMOTE CRT - D      system.  2. Nonischemic cardiomyopathy, ejection fraction by MRI in February      2009 of 17%.  3. MRI in February 2009, ejection fraction of 17%, dilated left      ventricle, global hypokinesis, right ventricular function normal,      and no infiltrate or scar.  4. Left heart catheterization in January 2009, ejection fraction of      25%, mild coronary plaque in the left anterior descending and the      right coronary artery.  5. New York Heart Association class III, chronic systolic congestive      heart failure.  6. Left bundle-branch block.   SECONDARY DIAGNOSES:  1. Dyslipidemia.  2. Family history of coronary artery disease.  3. Status post appendectomy and cholecystectomy.   PROCEDURE:  On Dec 14, 2007, implant of the St. Jude PROMOTE  cardioverter-defibrillator with left ventricular cardiac  resynchronization lead, Dr. Sherryl Manges.  The patient has no  postprocedural hematoma.  The device interrogated postprocedure day #1  with all values within normal limits.  The chest x-ray shows that the  leads are in appropriate position and there was no pneumothorax.  Follow  up appointments have been made.  Mobility of the left arm and incision  care have been discussed with the patient.  He is asked to keep his  incision dry for the next 7 days and to sponge bathe until Monday, Dec 21, 2007.   DISCHARGE MEDICATIONS:  1.  Lasix 40 mg twice daily.  2. Digoxin 0.125 mg daily.  3. Enteric-coated aspirin 81 mg daily.  4. Lisinopril 5 mg daily.  5. Simvastatin 40 mg daily at bedtime.  6. Spironolactone 25 mg daily.  7. Carvedilol 3.125 mg twice daily.   He follows up with [facility], [address] [place],  [place] at the ICD Clinic on Thursday, Dec 31, 2007 at 9:20 in the  morning.  Basic metabolic panel will be taken.  He sees Dr. Graciela Husbands at  [facility], Dr. Odessa Fleming office, we will call with  that appointment.  The patient is also advised to check in with Dr.  Neita Carp to address a problem, which he is brought up of postprocedure day  #1.  He states that he has tingling on the right side, it starts in the  right leg and goes all the way up to the right side and to the right  neck.  He has had this problem for a  while.  He feels it has been better  since he has been in bed after the device implant.  He asked [place] if  itwere related to a circulation deficit and this is assuredly not so as  his right leg pulses were bounding in the right foot.  Perhaps, this is  a neuro complaint.  Tingling could have possibly be involvement of the  sensory superficial nerves.  The patient has no slurred speech  consistent with a cortical insult nor does he have any motor deficits at  this hospitalization.  Once again the patient is advised to follow with  Dr. Fara Chute regarding this.   LABORATORY DATA:  Laboratory studies pertinent to this admission were  drawn on Dec 10, 2007, white cells of 5.4, hemoglobin 14.1, hematocrit  41.4, platelets are 228.  Protime 13.1, INR 1.1.  Sodium 135, potassium  3.9, chloride 99, carbonate 28, and glucose 115, BUN is 18, and  creatinine 1.0.   BRIEF HISTORY:  [name] is a 65 year old male.  He has a history of  nonischemic cardiomyopathy.  This was confirmed in January by left heart  cath.  He also had MRI in February.  The patient has a history of   significant congestive heart failure.  He has dyspnea on exertion, going  up less than 1 flight of stairs, and he has dyspnea prior to walking 200  feet.  He does not have nocturnal dyspnea, orthopnea, or peripheral  edema.   He has a history of a fall, that was unexplained in February 2009.  He  actually broke through a wall as he landed against it.  He is not sure  if he passed out and not.  He has no other history of loss of  consciousness or loss of postural tone.  He has no history of  palpitation.   Mr. Friesen has significant nonischemic cardiomyopathy, as this is a  reasonable consideration for ICD implantation, and regarding his left  bundle-branch block, he would benefit from synchronization therapy to  help with his New York Heart Association class III congestive heart  failure symptoms.  Hopefully after the device implantation, there might  be augmentation of blood pressure, which will allow further of titration  of his beta-blocker currently.  At this admission, the patient's blood  pressures run near the 100 systolic level.  Risks and benefits of  implantation of a cardioverter-defibrillator been discussed with the  patient and his family, they wished to proceed.      [name], [place]      Duke Salvia, MD, [facility]  Electronically Signed    GM/MEDQ  D:  12/15/2007  T:  12/16/2007  Job:  [id number]   cc:   Gwynneth Munson, MD,FACC  Duke Salvia, MD, [facility]

## 2010-12-18 NOTE — Letter (Signed)
Dec 10, 2007    Learta Codding, MD,FACC  [address]  [place], [place] [address]   RE:  [name], [name]  MRN:  [id number]  /  DOB:  1946-06-27   Dear [name]:   It was a pleasure to see [name] today at your request for  consideration of ICD - CRT implantation.   As you know, he is a 65 year old gentleman who has a history of  nonischemic cardiomyopathy confirmed by catheterization in January and  with MRI scanning confirming his ejection fraction of about 15-25%.  He  surprisingly has normal right sided pressures.  The patient also has a  history of significant congestive heart failure with dyspnea on exertion  of less than a flight of stairs and less than 200 feet.  He does not  have nocturnal dyspnea, orthopnea, or peripheral edema.   He has a history of a fall that was unexplained in February.  He landed  in the wall and actually broke the wall.  He is not sure whether he  passed out or not.  He has had no other episodes of loss of  consciousness or loss of postural tone.  He has no history of  palpitations.   PAST MEDICAL HISTORY:  In addition to above is notable for:  1. Dyslipidemia.  2. Recent problems with hoarseness, which is relatively new and      persistent.  3. Right ear hearing loss related to surgery.   PAST SURGICAL HISTORY:  In addition to above is notable for inguinal  herniorrhaphy, appendectomy, cholecystectomy.   REVIEW OF SYSTEMS:  Is broadly negative apart from the above.   SOCIAL HISTORY:  He is married.  He has one child, two grandchildren,  and one great-grandchild.  He does not use cigarettes, alcohol, or  recreational drugs.  He is retired on disability as a Engineer, materials.   His medications currently include furosemide 40, aspirin 81,  spironolactone 25, simvastatin 40, lisinopril 5, carvedilol 3.125,  digoxin 0.125.  Dosing up-titration has been limited by hypotension.   EXAMINATION:  Today he is in no acute distress.  His  blood pressure 110/74, pulse is 77.  His weight was 213 pounds.  He is and he is a Caucasian appearing his stated age.  HEENT exam demonstrated no icterus or xanthomata.  Neck veins were 7 cm.  His carotids are brisk and full bilaterally  without bruits.  BACK:  Without kyphosis or scoliosis.  Lungs were clear.  The heart sounds were regular without murmurs or gallops.  ABDOMEN:  Soft with active bowel sounds without midline pulsation or  hepatomegaly.  Femoral pulses were 2+.  Distal pulses were intact.  There is no clubbing, cyanosis or edema.  The neurological exam was grossly normal.  His skin was warm and dry.   ELECTROCARDIOGRAM:  From January 19, demonstrates sinus rhythm at 100  beats per minute with intervals of 0.18/0.15/0.39 with axis leftward at  -35.  There is left bundle branch block.  Electrocardiogram April 13 was  notable for heart rate a little bit slower at 94.   Other labs tests are not currently available.   IMPRESSION:  1. Nonischemic cardiomyopathy.  2. Congestive heart failure - class III.  3. Left bundle branch block.  4. Hypotension limiting up-titration of medications.  5. Resting tachycardia.  6. Hoarseness.   [name], Mr. [name] has significant nonischemic congestive cardiomyopathy  and is reasonably considered for ICD implantation with resynchronization  therapy based on that.  The hope would be that following device  implantation with CRT, that there would be some augmentation of his  blood pressure, allowing for further beta blocker therapy.   I have reviewed with him and his wife extensively the physiology and the  potential benefits as well as potential risks of ICD with CRT  implantation.  These include but are not limited to death, perforation,  infection, lead dislodgement, vascular injury, and device malfunction.  They understand these risks and would like to proceed.     In addition, I am concerned about is hoarseness and have suggested  that  he see an ENT physician for further evaluation.  They understand this  and will proceed with trying to accomplish that.   Thank you very much for the consultation.    Sincerely,      Duke Salvia, MD, [facility]  Electronically Signed    SCK/MedQ  DD: 12/10/2007  DT: 12/10/2007  Job #: [id number]   CC:    Fara Chute

## 2010-12-18 NOTE — Assessment & Plan Note (Signed)
[facility]                          [facility] OFFICE NOTE   [name], [name]                     MRN:          [id number]  DATE:11/16/2007                            DOB:          02-07-1946    CARDIOLOGIST:  Learta Codding, MD.   PRIMARY CARE PHYSICIAN:  Fara Chute, MD   REASON FOR VISIT:  Three-month follow-up.   HISTORY OF PRESENT ILLNESS:  [name] is a very pleasant 65 year old  male patient with a history of nonischemic cardiomyopathy with an EF of  about 20%, who presents to the office today for follow-up.  After being  seen at his last appointment, he was set up for an MRI that was done at  [facility] in [place].  This revealed no evidence of infiltration or  scar tissue.  His EF was 17%.  He had normal right-sided cardiac  chambers.  The patient has had difficulty with finances.  Referral to  electrophysiology was put on hold secondary to this.  He now has  qualified for Medicaid and is covered on Medicaid for the next 6 months.  He would like to proceed with CRT therapy if indicated.   The patient continues to note New York Heart Association class III  symptoms.  He sleeps in a recliner.  He denies PND.  He has mild pedal  edema without much change.  He has a chronic cough that is productive of  clear sputum.  He denies any hemoptysis.  He denies chest pain.  He  continues to note significant weakness and lack of energy.  He denies  any true syncope or near-syncope.  He did have an episode where he  possibly lost his balance in his basement at his home several weeks ago.  He did fall but does not know if he actually passed out.  He denies any  palpitations or recurrence of this episode.   MEDICATIONS:  1. Furosemide 40 mg daily.  2. Aspirin 81 mg daily.  3. Spironolactone 25 mg daily.  4. Simvastatin 40 mg nightly.  5. Lisinopril 5 mg daily.  6. Carvedilol 3.25 mg b.i.d.  7. Digoxin 0.25 mg daily.   ALLERGIES:   PENICILLIN and TETANUS.   SOCIAL HISTORY:  He denies tobacco abuse.   PHYSICAL EXAM:  He is a well-nourished, well-developed man in no acute  distress.  His blood pressure is 110/74, pulse 85, weight 212.8 pounds.  This is  down from 214.2 pounds at his last office visit.  Oxygen saturation on  room air is 97%.  HEENT:  Normal.  NECK:  Without JVD.  CARDIAC:  Normal S1 and S2, regular rate and rhythm.  No S3.  LUNGS:  Clear to auscultation bilaterally.  ABDOMEN:  Soft, nontender.  EXTREMITIES:  Without edema.  NEUROLOGIC:  He is alert and oriented x3.  Cranial nerves II-XII grossly  intact.   Electrocardiogram reveals left bundle branch block, sinus rhythm, heart  rate 94.   IMPRESSION:  1. Nonischemic cardiomyopathy with an ejection fraction of 20-25%.  2. Chronic systolic congestive heart failure.      a.  New York Heart Association class III.  3. Minimal nonobstructive coronary artery disease by catheterization      in June 2009.  4. Chronic left bundle branch block.   PLAN:  [name] returns to the office today for follow-up.  Overall,  he is stable.  He continues to exhibit NYHA class III symptoms.  He has  left bundle branch block on his electrocardiogram.  He now has Medicaid  coverage and would like to proceed with CRT/ICD therapy if indicated.  Therefore, we will refer him to electrophysiology in [place].  He is  in need of a follow-up echocardiogram.  That will be done at our  [facility] office on the same day as his appointment with Dr. Graciela Husbands.  The patient will be brought back in follow-up in the next 3 months.  No  medication changes have been made today.  Of note, he has had a cough  but is currently having problems with financial constraints.  If he  receives prescription drug coverage, we could certainly consider  changing his ACE inhibitor to an ARB in the future.  We will check a  BMET and a BNP level today.      [name], PA-C   Electronically Signed      Learta Codding, MD,FACC  Electronically Signed   SW/MedQ  DD: 11/16/2007  DT: 11/16/2007  Job #: [id number]   cc:   Fara Chute

## 2010-12-18 NOTE — Discharge Summary (Signed)
NAME:  [name], [name] NO.:  [id number]   MEDICAL RECORD NO.:  [id number]          PATIENT TYPE:  INP   LOCATION:  3703                         FACILITY:  [facility]   PHYSICIAN:  Learta Codding, MD,FACC DATE OF BIRTH:  24-Jun-1946   DATE OF ADMISSION:  08/05/2007  DATE OF DISCHARGE:  08/08/2007                               DISCHARGE SUMMARY   DISCHARGE DIAGNOSIS:  Acute systolic congestive heart failure.   SECONDARY DIAGNOSES:  1. Nonischemic cardiomyopathy with an ejection fraction of 25% and      global hypokinesis.  2. Nonobstructive coronary artery disease by catheterization on this      admission.  3. History of hernia repair.  4. History of gallbladder surgery.  5. Left bundle branch block.   ALLERGIES:  PENICILLIN.   PROCEDURE:  Left heart cardiac catheterization and 2-D echocardiogram.   HISTORY OF PRESENT ILLNESS:  A 65 year old male without prior cardiac  history who recently saw his primary care Leiana Rund secondary to a 2 to  3-week history of progressive lower extremity edema and dyspnea with  minimal exertion.  An ECG was performed in Dr. Dian Situ  office showing  left bundle branch block and apparently an echocardiogram also was  performed showing reduction LV function.  He subsequently seen by Dr.  Andee Lineman on August 05, 2007,  and the patient was felt to be in acute  heart failure and he was admitted to [facility] for further evaluation  and diuresis.   HOSPITAL COURSE:  The patient responded well to intravenous Lasix and  was eventually initiated low-dose ACE inhibitor, beta blocker and  spironolactone therapy.  A 2-D echocardiogram was performed on August 07, 2007,  showing an EF of 20% with diffuse LV hypokinesis with regional  variations.  There was mild aortic insufficiency as well as mild mitral  regurgitation.  Cardiac catheterization was performed to rule out an  ischemic cause of cardiomyopathy and this revealed moderately elevated  right heart pressures with a PA of 37/59, mean of 30 a wedge of 18.  Cardiac output was 4.7 with an index of 2.18.  He had nonobstructive  coronary artery disease and EF by left ventriculography was 25% with  global hypokinesis.  This morning Mr. Jost is symptomatically and  clinically improved.  We will plan to discharge him home today and will  arrange follow-up in 1 week and follow with Dr. Andee Lineman in 2-3 weeks.  We  will further plan repeat 2-D echocardiogram in approximately 3 months  and determine his candidacy for ICD/CRT therapy at that time.  Finally  we will probably we will look to schedule an outpatient cardiac MRI to  rule out infiltrative process as a cause for nonischemic cardiomyopathy.   DISCHARGE LABS:  Hemoglobin 12.6, hematocrit 36.9, WBC 7.4, platelets  259.  Sodium 134, potassium 4.1, chloride 100, CO2 of 29, BUN 15,  creatinine 1.19, glucose 121, total bilirubin 0.7, alkaline phosphatase  75, AST 34, ALT 42, total protein 6.3, albumin 3.5, calcium 8.2,  magnesium 2, CK 300, MB 8.4, troponin I 0.05.  BNP was 965.  Sputum  culture  negative.  Chest x-ray showed cardiac enlargement, right pleural  effusion.   DISPOSITION:  The patient is being discharged home today in good  condition.   FOLLOW-UP PLAN/APPOINTMENTS:  As above.  We will arrange for a BMET in 1  week and follow up Dr. Andee Lineman in 2-3 weeks.  Follow-up echo in  approximately 3 months and MRI to be determined by Dr. Andee Lineman.   DISCHARGE MEDICATIONS:  Coreg 3.125 mg b.i.d., lisinopril 5 mg daily  Lasix 4 mg daily Aldactone 25 mg daily, aspirin 81 mg daily, simvastatin  40 mg at bedtime,  K-Dur 20 mEq daily, Zithromax 250 mg daily times  three additional days.   OUTSTANDING LABORATORY STUDIES:  None.   DURATION DISCHARGE ENCOUNTER:  45 minutes including physician time      Nicolasa Ducking, ANP      Learta Codding, MD,FACC  Electronically Signed    CB/MEDQ  D:  08/08/2007  T:  08/08/2007   Job:  [id number]   cc:   Fara Chute

## 2010-12-18 NOTE — Assessment & Plan Note (Signed)
[facility]                          [facility]ICE NOTE   [name], [name]                     MRN:          [id number]  DATE:08/24/2007                            DOB:          05-14-1946    PRIMARY CARDIOLOGIST:  Learta Codding, MD, [facility]   REASON FOR VISIT:  Post hospital followup.   The patient returns to the clinic after being sent directly from here to  [facility], by Dr. Andee Lineman, on December 31, when he was initially  referred to [place] with new onset congestive heart failure.   The patient underwent coronary angiography, by Dr. Rollene Rotunda,  including both left and right side, revealing severe, nonischemic  cardiomyopathy with only mild coronary atherosclerosis.  Left  ventricular function was assessed at 25%, with global hypokinesis.  There was mild elevation of the pulmonary pressures, with no evidence  suggestive of restrictive physiology.   A 2-D echocardiogram suggested global hypokinesis with an ejection  fraction of 20%, with no diagnostic evidence of regional wall motion  abnormalities.  There was also suggestion of mild MR and AR, with  moderate dilatation of both atria.  No thrombus was noted.   Clinically, the patient reports some improvement in his dyspnea.  However, he still has significant symptoms even with minimal exertion,  suggestive of NYHA class III heart failure.  According to his wife, he  continues to have to sleep in a recliner as he has been doing the last 2  months, or so.  He has some improvement in his lower extremity edema.  He denies any tachy palpitations or near-syncope/syncope.   The patient did have extensive blood work following discharge.  This is  notable for a creatinine of 1.2 with a BUN of 26, sodium 131 with  potassium 4.5, BNP of 1300 (965 at discharge), and a normal CBC.   Electrocardiogram today reveals a persistent sinus tachycardia at 101  bpm.  Chronic left bundle branch  block.   MEDICATIONS:  1. Furosemide 40 daily.  2. Aspirin 81 daily.  3. Spironolactone 25 mg daily.  4. Simvastatin 40 daily.  5. Lisinopril 5 daily.  6. Carvedilol  3.125 b.i.d.   PHYSICAL EXAMINATION:  Blood pressure 99/74, pulse 99, regular; weight  214 (down 13 pounds).  GENERAL:  65 year old male sitting upright in no distress.  HEENT: Normocephalic, atraumatic.  NECK:  Palpable carotid pulses without bruits; jugular venous distention  noted at 90 degrees.  LUNGS:  Clear to auscultation all fields.  HEART:  Regular rhythm and increased rate (S1, S2) no significant  murmurs.  ABDOMEN:  Benign.  EXTREMITIES:  2- 3+, bilateral nonpitting edema.  NEURO:  Flat affect, but no focal deficit.   IMPRESSION:  1. Severe nonischemic cardiomyopathy.      a.     Ejection fraction 25% with global hypokinesis, by recent       cardiac catheterization.  2. Chronic systolic heart failure.      a.     Current New York Heart Association class III.  3. Chronic left bundle branch block.  4. Persistent lower extremity edema.  5. Persistent sinus tachycardia.      a.     Physiologic response, secondary to low output heart failure.  6. Relative hypotension.   PLAN:  Following review with Dr. Andee Lineman, recommendations are as follows  :  1. Refer patient to our electrophysiology team in [place], for      consideration of CRT/ICD implantation.  2. Follow-up 2-D echocardiogram in 3 months, for reassessment of left      ventricular function.  3. Aggressive diuretic treatment with up titration of Lasix to 40 mg      b.i.d. We will also had digoxin 0.125 mg daily, to provide some      inotropic support particularly given that he has persistent      hypotension.  4. Follow-up blood work with a BMET and a trough digoxin level in 1      week.  5. The patient's wife will contact our office to let us know if the      patient is, in fact, taking any supplemental potassium at home.  I      pointed  out the possible complications of doing so, given that he      is on spironolactone.  Most recent follow-up blood work, however,      showed normal potassium at 4.5.  However, this was listed as one of      his discharge medications,      and we will investigate to make sure that he either is, or is not,      taking supplemental K-Dur.  6. Schedule return clinic follow-up with myself and Dr. Andee Lineman in 3      months.     Gene Serpe, PA-C  Electronically Signed      Learta Codding, MD,FACC  Electronically Signed   GS/MedQ  DD: 08/24/2007  DT: 08/24/2007  Job #: [id number]   cc:   Fara Chute

## 2010-12-18 NOTE — Op Note (Signed)
NAME:  [name], [name] NO.:  [id number]   MEDICAL RECORD NO.:  [id number]          PATIENT TYPE:  INP   LOCATION:  2005                         FACILITY:  [facility]   PHYSICIAN:  Duke Salvia, MD, FACCDATE OF BIRTH:  1946/06/05   DATE OF PROCEDURE:  12/14/2007  DATE OF DISCHARGE:                               OPERATIVE REPORT   PREOPERATIVE DIAGNOSES:  Congestive heart failure, nonischemic  cardiomyopathy, left bundle-branch block.   POSTOPERATIVE DIAGNOSES:  Congestive heart failure, nonischemic  cardiomyopathy, left bundle-branch block.   PROCEDURES:  Dual-chamber defibrillator implantation with left  ventricular lead placement and intraoperative defibrillation threshold  testing; contrast venography.   Following obtaining informed consent, the patient was brought to the  Electrophysiology Laboratory and placed on the fluoroscopic table in the  supine position.  After routine prep and drape of the left upper chest,  lidocaine was infiltrated in the prepectoral subclavicular region.  Incision was made and carried down to the layer of the prepectoral  fascia with electrocautery and sharp dissection.  A pocket was formed  similarly.  Hemostasis was obtained.   Thereafter, attention was turned to gain access to extrathoracic left  subclavian vein, which turned out to be quite difficult.  We ended up  needing to do a contrast venogram ultimately, and I think based on the  collateral flow pattern, there must have been significant narrowing.  We  ultimately guide our first lead in by going over the second rib, a 0.03  wire would not pass, the [facility] wire would not pass, and so we ended  using a glidewire, which passed very easily across this area of  narrowing.  We then took two relatively medial first rib sticks and was  able to using the micropuncture kit to gain 3 separate venipuncture  accesses.   Sequentially, an 8-French, 9.5-French, and 7-French sheaths  were placed  through which we passed sequentially a St. Jude Durata 7121 ICD lead,  serial number [id number], a Medtronic MB2 coronary sinus cannulation  catheter, and a St. Jude Tendril 1688TC 52-cm active-fixation atrial  lead, serial number [id number].  Under fluoroscopic guidance, the RV lead  was manipulated through the apex whereas the amplitude was 11 with a  pace impedance of 930 ohms, a threshold 1 volt at 0.5 msec.  Current  threshold was 1.0 mA.  There is no diaphragmatic pacing at 10 volts, the  current of injury was prominent.  This lead was secured to the  prepectoral fascia.   Attention was then turned to gain access to the coronary sinus.  This  turned out to be simple than I would have expected retrospectively based  on the very small size of the coronary sinus.  We then took a series of  venograms.  There were no posterolateral branches at all.  There was one  mid to high lateral branch, which was very small, and then a high  anterolateral branch which we were subsequently able to put a wire and  then cannulate.  Unfortunately, however, the V1-V2 interval here was  only about 25 msec and we abandoned this location.  On one of the  venograms, we had identified a bailout branch and we then proceeded to  cannulate this, which was fairly difficult and required a Whisper wire  followed by a Medtronic Attain II cannulation dilator system over which  was then passed the MB2 sheath into this vein.  We then used the  previously utilized Easytrak 2 lead, model 4543, serial number [id number]  and manipulated through the terminal ramification of this vein where the  distal centimeter or so had coursed upon the lateral wall.  The V1-V2  interval here was 82 msec.  The threshold was 0.6 volts of 0.5 msec with  an LV amplitude of 27 mV with an impedance of 1857 ohms.  The current  threshold 0.3 mA.  Unfortunately, there was diaphragmatic stimulation at  6 volts.  However, we had no other  options based on what we had explored  previously.  The 9.5-French sheath was removed and then the atrial lead  were described above was positioned in the right atrial appendage where  bipolar P-wave was 5.7 with a pace impedance of 630 ohms, a threshold  0.5 volts at 0.5 msec.  Current threshold 0.7 mA.  The current of injury  was prominent and there was no diaphragmatic pacing at 10 volts.  This  lead was secured to the prepectoral fascia, and then a finishing wire  was utilized to secure LV lead positioning while the sheath was removed  fluoroscopically, it did not move.  Christia Reading was added.  The lead was  secured to the prepectoral fascia, and then the leads were attached to a  St. Jude Promote RF [id number] ICD, serial number [id number].  Through the device,  the bipolar P-wave was 3.4 with a pace impedance of 600 ohms, a  threshold 0.5 volts at 0.5 msec.  The R-wave was 12 with a pace  impedance of 690 ohms, a threshold 0.5 volts at 0.5 msec, and the LV  impedance was 1400 ohms with a threshold 0.5 volts at 0.5 msec.  High-  voltage impedances were 56 ohms.  At this point, the defibrillator was  implanted and DFT testing was undertaken.  Ventricular fibrillation was  induced via the T-wave shock.  After total duration of 5 seconds, a 15  joules shock was delivered through a measured resistance of 40 ohms  terminating ventricular fibrillation and restoring sinus rhythm.  The  pocket was copiously irrigated with antibiotic containing saline  solution.  Hemostasis was assured.  The leads and the pulse generator  were secured to the prepectoral fascia, and the wound was then closed in  three layers in a normal fashion.  The wound was washed and dried, and a  benzoin and Steri-Strip dressing was applied.  Needle counts, sponge  counts and instrument counts were correct at the end of the procedure  according to the staff.  The patient tolerated the procedure without  apparent complication.       Duke Salvia, MD, [facility]  Electronically Signed     SCK/MEDQ  D:  12/14/2007  T:  12/15/2007  Job:  [id number]   cc:   Learta Codding, MD,FACC  [facility]  Electrophysiology Laboratory  [facility]

## 2010-12-18 NOTE — Discharge Summary (Signed)
NAME:  [name], [name] NO.:  [id number]   MEDICAL RECORD NO.:  [id number]          PATIENT TYPE:  INP   LOCATION:  3703                         FACILITY:  [facility]   PHYSICIAN:  Learta Codding, MD,FACC DATE OF BIRTH:  [birth year]   DATE OF ADMISSION:  08/05/2007  DATE OF DISCHARGE:                               DISCHARGE SUMMARY   REFERRING PHYSICIAN:  Dr. Fara Chute.   REASON FOR ADMISSION:  New-onset heart failure.   HISTORY OF PRESENT ILLNESS:  The patient is a 65 year old male with no  prior cardiovascular history referred by Dr. Neita Carp for evaluation of  heart failure.  The patient reports that over the last 2-3 weeks he has  noticed swelling in the lower extremities and more recently has been  experiencing significant dyspnea on minimal exertion and also at rest.  In talking to the patient's wife, his symptoms actually may have been  antedating this period and may well have been going on for several  months.  She has noticed a decreased in exercise tolerance in  particular.  In the last few weeks, the patient stated that he had  significant abdominal swelling.  He experienced symptoms of orthopnea,  PND, but no substernal chest pain.  The patient is very concerned  regarding the cost of his medical bills and has initially deferred from  seeing a physician.  He finally saw Dr. Neita Carp yesterday when he was  markedly fatigued and weak and very dyspneic.  An electrocardiogram  demonstrated a left bundle branch block on ECG.  A chest x-ray was  performed which, per report, demonstrated congestive heart failure.  The  patient also had an echocardiographic study performed in Dr. Dian Situ  office.  At this point, we have no preliminary report available, but  according to the patient's wife, he was told by the technician that he  had significant LV dysfunction with his heart working at half  strength.  I suspect the patient may have an ejection fraction of 30%,  although I cannot confirm this.  The patient reports no palpitations.  He has no syncope.  He does report marked fatigue and weakness.  He is  visibly dyspneic while talking to me in the office.   ALLERGIES:  PENICILLIN.   MEDICATIONS:  Lisinopril 20 mg a day and furosemide 40 mg a day.  This  was started by Dr. Neita Carp yesterday.   SOCIAL HISTORY:  The patient is retired.  He lives in [place] with  his wife.   FAMILY HISTORY:  Notable for mother who died at age 35 of unknown  causes.  Father died at age 46 from heart attack.  He also has a brother  who had stent placement.  He has another brother that died from diabetes  and cancer.   PAST MEDICAL HISTORY:  1. History of hernia repair.  2. History of gallbladder surgery.   REVIEW OF SYSTEMS:  As per HPI.  No nausea or vomiting.  No fever or  chills.  No melena or hematochezia.  No dysuria or frequency.  No  palpitations or syncope.  No neurological symptoms.  The remainder of  the review of systems positive as outlined above.   PHYSICAL EXAMINATION:  VITAL SIGNS:  Blood pressure 95/71, heart rate  113 beats per minute, weight is 227 pounds.  GENERAL:  A pale-appearing white male, visibly dyspneic.  NECK EXAM:  Normal carotid upstroke and no carotid bruits.  The JVD is  approximately 10 cm.  There is abdominojugular reflux.  LUNGS:  Diminished breath sounds bilaterally with crackles halfway up  and diminished breath sounds one-third up consistent with bilateral  pleural effusions.  HEART:  Regular rate and rhythm with a paradoxically split second heart  sound and an audible S3.  There is a 2/6 holosystolic murmur at the left  sternal border.  ABDOMEN:  Soft, nontender.  No rebound or guarding.  Good bowel sounds.  EXTREMITY EXAM:  Demonstrates no cyanosis or clubbing.  There is marked  3-4+ peripheral pitting edema.  Peripheral pulses are palpable  bilaterally in dorsalis pedis and posterior tibial pulses.   LABORATORY  DATA:  Laboratory work is pending.   Chest x-ray and ECHO as reported above.   A 12-lead electrocardiogram:  Sinus tachycardia with left bundle branch  block.   PROBLEM LIST:  1. New-onset congestive heart failure (approximately 4-6 weeks).      a.     Rule out ischemic heart disease.      b.     Rule out nonischemic cardiomyopathy (the latter is less       likely).      c.     Suspect bilateral pleural effusions.  2. Rule out ischemic heart disease.      a.     Family history of coronary artery disease.      b.     Left bundle branch block by EKG of unknown duration.  3. Family history of coronary artery disease.   PLAN:  1. The patient is in substantial heart failure.  It does appear that      the symptoms are rather subacute, but I do suspect underlying      ischemic heart disease as the etiology.  2. The patient will be admitted immediately to the hospital and will      be given aspirin, intravenous Lasix, and continued on oral ACE      inhibitor therapy.  3. The patient has sinus tachycardia likely secondary to severe LV      systolic dysfunction heart failure.  We will defer from initiating      a beta-blocker until the patient has been adequately diuresed.  4. The patient will be placed on oxygen.  5. I discussed with the patient the need to proceed with a diagnostic      cardiac catheterization.  This will be performed after he has been      diuresed and we have tentatively scheduled this for Friday.  I have      requested a left and right heart catheterization to be performed.      Learta Codding, MD,FACC  Electronically Signed     GED/MEDQ  D:  08/06/2007  T:  08/06/2007  Job:  [id number]   cc:   Fara Chute

## 2010-12-18 NOTE — Cardiovascular Report (Signed)
[facility]                   [facility] DEVICE CLINIC NOTE   NAME:CAMPBELLLedell, Codrington                     MRN:          [id number]  DATE:04/01/2008                            DOB:          March 30, 1946    Mr. Creelman was seen approximately 3 months after CRT-D implantation  for congestive heart failure in the setting of nonischemic  cardiomyopathy.  He is 80% better according to his wife.  He still  gives out at the end of the day.   He has had no episodes of lightheadedness or syncope.   MEDICATIONS:  1. Carvedilol 12.5 b.i.d.  2. Lisinopril 5.  3. Aspirin 81.  4. Digitek 0.125.  5. Simvastatin.   His Aldactone had been discontinued because of gynecomastia.   PHYSICAL EXAMINATION:  VITAL SIGNS:  His blood pressure is 123/67 with a  pulse of 79.  NECK:  His neck veins were flat.  LUNGS:  Clear.  HEART:  Sounds were regular.  EXTREMITIES:  Without edema.   Interrogation of St. Jude Promote ICD demonstrates an R-wave of 5 with  impedance of 400, threshold 0.5 and 0.5, in both right-sided chambers,  the amplitude was 12 with impedance of 616 in the RV, impedance was 480  with threshold of 1.5, volts of 0.5 in the LV.  There were occasional LV  thumps as an outpatient.  In addition, we have a number of episodes of  atrial fibrillations, the longest of which is 6 hours and some of which  are associated with rapid ventricular response.   IMPRESSION:  1. Nonischemic cardiomyopathy.  2. Class II congestive heart failure - chronic - systolic, improved.  3. Status post St. Jude CRT implantation.  4. Paroxysmal atrial fibrillation with durations up to 6 hours with a      rapid ventricular response.   We have reprogrammed Mr. Rivers's device to minimize the likelihood of  inappropriate therapy from his atrial fibrillation.  I have also taken  with his blood pressure being adequate to  increase his Coreg from 12.5 to 18.75 twice daily,  and he has to  increase his dose to 25 mg  twice daily when the prescriptions are expired.  He is to see Dr. Andee Lineman  in a few months.  We will see him again in 1 year's time, and he will be  followed on remote monitoring in the interim.     Duke Salvia, MD, [facility]  Electronically Signed    SCK/MedQ  DD: 04/01/2008  DT: 04/02/2008  Job #: [id number]

## 2010-12-18 NOTE — Cardiovascular Report (Signed)
NAME:  [name], [name] NO.:  [id number]   MEDICAL RECORD NO.:  [id number]          PATIENT TYPE:  INP   LOCATION:  3703                         FACILITY:  [facility]   PHYSICIAN:  Rollene Rotunda, MD, FACCDATE OF BIRTH:  Mar 22, 1946   DATE OF PROCEDURE:  08/07/2007  DATE OF DISCHARGE:                            CARDIAC CATHETERIZATION   PRIMARY:  Dr. Neita Carp   CARDIOLOGIST:  Dr. Andee Lineman, [facility] in [place].   PROCEDURE:  Left heart and right heart catheterization/coronary  arteriography.   INDICATION:  Evaluate patient with cardiomyopathy.   PROCEDURE NOTE:  Left heart catheterization was performed via the right  femoral artery.  Right heart catheterization was performed via the right  femoral vein.  Both vessels were cannulated using the anterior wall  puncture.  A #6 French arterial sheath and #7 French venous sheath were  inserted via the modified Seldinger technique.  Preformed Judkins,  pigtail and Swan-Ganz catheters were utilized.  The patient tolerated  the procedure well and left the lab in stable condition.   RESULTS:  Hemodynamic:  RA mean 7, RV 37/13, PA 37/25 with a mean of 30,  pulmonary capillary wedge pressure mean 18, LV 80/23, AO 82/67, cardiac  output (Fick) 4.7/2.18.   Coronaries:  Left main was normal.  The LAD had a proximal calcified 25%  stenosis followed by 25% stenosis.  There were mid luminal  irregularities.  First diagonal was moderate size and normal.  Circumflex in the AV groove was normal.  There was an obtuse marginal  which was large, branching and normal.  The right coronary artery was  large and dominant.  There were diffuse luminal irregularities.  There  was distal 30% stenosis before the PDA.  The PDA was large and normal.  Posterolateral was moderate size and normal.   Left ventriculogram:  Left ventriculogram was obtained in the RAO  projection.  The EF was 25% with global hypokinesis.   CONCLUSION:  Severe  nonischemic cardiomyopathy.  Mild coronary plaque.  Mildly elevated pulmonary pressures with RV pressures being relatively  normal.  There was no evidence on the RV tracing of a restrictive  physiology.   PLAN:  The patient will have medical management.  Will continue to look  for etiologies of his nonischemic cardiomyopathy.  Will consider MRI and  thyroid and studies.      Rollene Rotunda, MD, [facility]  Electronically Signed     JH/MEDQ  D:  08/07/2007  T:  08/07/2007  Job:  [id number]   cc:   Gwynneth Munson, MD,FACC

## 2010-12-18 NOTE — Assessment & Plan Note (Signed)
[facility]                          [facility] NOTE   [name], [name]                     MRN:          [id number]  DATE:12/28/2008                            DOB:          02-Apr-1946    REFERRING PHYSICIAN:  Fara Chute   HISTORY OF PRESENT ILLNESS:  [name] is a pleasant 65 year old male  with a history of nonischemic cardiomyopathy with an ejection fraction  of 15%.  The patient is status post CRTD implantation by Dr. Graciela Schwartz with  St. Jude device on Dec 14, 2007.  The patient is an NYHA class IIB.  He  denies any orthopnea or PND, palpitations or syncope.  From a heart  failure standpoint, he has remained stable and has not required any  hospitalizations.  His EKGs were reviewed in the office today and were  stable with evidence of AV sequential BiV pacing.   MEDICATIONS:  1. Furosemide 40 mg p.o. daily.  2. Aspirin 81 mg p.o. daily.  3. Simvastatin 40 mg p.o. nightly.  4. Lisinopril 5 mg p.o. daily.  5. Digoxin 0.125 daily.  6. Carvedilol 25 mg p.o. b.i.d.   PHYSICAL EXAMINATION:  VITAL SIGNS:  Blood pressure is 94/61, heart rate  is 66, weight is 216 pounds.  NECK:  Normal carotid upstroke and no carotid bruits.  LUNGS:  Clear breath sounds bilaterally.  HEART:  Regular rate and rhythm.  Normal S1 and S2.  No murmurs, rubs,  or gallops.  ABDOMEN:  Soft, nontender.  No rebound or guarding.  Good bowel sounds.  EXTREMITIES:  No cyanosis, clubbing, or edema.  NEURO:  The patient is alert, oriented, and grossly nonfocal.   PROBLEM #1:  1. Nonischemic cardiomyopathy, ejection fraction 15%.  2. Status post CRTD Dec 14, 2007 with St. Jude device.  3. Chronic systolic heart failure, NYHA class IIB without recurrent      heart failure admissions.  4. Minimal nonobstructive coronary artery disease by catheterization      in 2009.  5. Atrioventricular sequential pacing.   PLAN:  1. The patient is doing well from heart  failure standpoint.  I made no      changes to his      medications.  We did refill his meds.  We gave him a 90 days      supply.  2. The patient has a followup appointment with Dr. Graciela Schwartz in September      in this office.     Learta Codding, MD,FACC  Electronically Signed    GED/MedQ  DD: 01/02/2009  DT: 01/03/2009  Job #: [id number]   cc:   Fara Chute

## 2011-01-10 ENCOUNTER — Encounter: Payer: Self-pay | Admitting: Internal Medicine

## 2011-01-10 ENCOUNTER — Ambulatory Visit (INDEPENDENT_AMBULATORY_CARE_PROVIDER_SITE_OTHER): Payer: BLUE CROSS/BLUE SHIELD | Admitting: Internal Medicine

## 2011-01-10 DIAGNOSIS — E119 Type 2 diabetes mellitus without complications: Secondary | ICD-10-CM

## 2011-01-10 DIAGNOSIS — I4891 Unspecified atrial fibrillation: Secondary | ICD-10-CM

## 2011-01-10 DIAGNOSIS — I2510 Atherosclerotic heart disease of native coronary artery without angina pectoris: Secondary | ICD-10-CM

## 2011-01-10 DIAGNOSIS — I5022 Chronic systolic (congestive) heart failure: Secondary | ICD-10-CM

## 2011-01-10 DIAGNOSIS — I428 Other cardiomyopathies: Secondary | ICD-10-CM

## 2011-01-10 DIAGNOSIS — I447 Left bundle-branch block, unspecified: Secondary | ICD-10-CM

## 2011-01-10 NOTE — Progress Notes (Signed)
The patient presents today for routine electrophysiology followup.  Since last being seen in our clinic, the patient reports doing reasonably well.  His primary concern is with elevated blood sugars.  He has been noncompliant with medical therapy and reports elevated blood sugars (over 300mg /dl).  Today, he denies symptoms of palpitations, chest pain, shortness of breath, orthopnea, PND, lower extremity edema, dizziness, presyncope, syncope, or neurologic sequela.  The patient feels that he is tolerating medications without difficulties and is otherwise without complaint today.   Past Medical History  Diagnosis Date  . CAD (coronary artery disease)     Minimal nonobstructive CAD by Cath 2009  . Systolic heart failure, chronic   . LBBB (left bundle branch block)   . Secondary cardiomyopathy   . Diabetes mellitus     noncompliant with medical therapy  . Atrial fibrillation     previously detected on ICD eval   Past Surgical History  Procedure Date  . Hernia repair   . Fiscula repair   . Hearing surgery   . Cholecystectomy   . Cardiac defibrillator placement 12/14/2007    St. Jude Promote RF 3207 ICD placed by Dr. Graciela Husbands for CHF, nonischemic cardiomyopathy    Current Outpatient Prescriptions  Medication Sig Dispense Refill  . aspirin 81 MG tablet Take 1 tablet by mouth daily.       . carvedilol (COREG) 25 MG tablet Take 1 tablet by mouth two times a day.       . Cinnamon 500 MG TABS Take 1 tablet by mouth daily.       . digoxin (LANOXIN) 0.125 MG tablet Take 1 tablet by mouth daily.       . furosemide (LASIX) 40 MG tablet Take 1/2 tablet by mouth two times a day.        . lisinopril (PRINIVIL,ZESTRIL) 5 MG tablet Take 5 mg by mouth daily.        . simvastatin (ZOCOR) 40 MG tablet Take 1 tablet by mouth every night.       . vitamin B-12 (CYANOCOBALAMIN) 500 MCG tablet Take 1 tablet by mouth daily.       Marland Kitchen DISCONTD: lisinopril (PRINIVIL,ZESTRIL) 10 MG tablet Take 1 tablet by mouth daily.          Allergies  Allergen Reactions  . Penicillins     History   Social History  . Marital Status: Married    Spouse Name: N/A    Number of Children: N/A  . Years of Education: N/A   Occupational History  . Retired    Social History Main Topics  . Smoking status: Former Smoker -- 0.3 packs/day for 3 years    Types: Cigarettes    Quit date: 08/05/1965  . Smokeless tobacco: Never Used  . Alcohol Use: No  . Drug Use: No  . Sexually Active: Not on file   Other Topics Concern  . Not on file   Social History Narrative  . No narrative on file    Family History  Problem Relation Age of Onset  . Cancer    . Coronary artery disease    . Diabetes      ROS-  All systems are reviewed and are negative except as outlined in the HPI above  Physical Exam: Filed Vitals:   01/10/11 1509  BP: 113/73  Pulse: 76  Height: 6' (1.829 m)  Weight: 209 lb (94.802 kg)    GEN- The patient is overweight , alert and oriented x 3 today.  Head- normocephalic, atraumatic Eyes-  Sclera clear, conjunctiva pink Ears- hearing intact Oropharynx- clear Neck- supple, no JVP Lymph- no cervical lymphadenopathy Lungs- Clear to ausculation bilaterally, normal work of breathing Chest- ICD pocket is well healed Heart- Regular rate and rhythm, no murmurs, rubs or gallops, PMI not laterally displaced GI- soft, NT, ND, + BS Extremities- no clubbing, cyanosis, trace edema MS- no significant deformity or atrophy Skin- no rash or lesion Psych- euthymic mood, full affect Neuro- strength and sensation are intact  ICD interrogation- reviewed in detail today,  See PACEART report  Assessment and Plan:

## 2011-01-10 NOTE — Assessment & Plan Note (Signed)
Today's interrogation reveals no episodes of afib. Given diabetes and CHF, he should be on coumadin longterm.  He has previously been reluctant to consider coumadin.  I would strongly encourage this if his afib burden increases in the future.

## 2011-01-10 NOTE — Assessment & Plan Note (Addendum)
The importance of adequate glycemic control was discussed at length today. The patient has been very noncompliant with therapies. Given elevated blood sugars in excess of 300 mg/dl, I told him that he will almost certainly require insulin.  Given CHF, I would avoid metformin.  He states that he wants to continue to try weight loss and exercise, though this strategy is pretty clearly not working for him. I have encouraged him to follow-up with his PCP for medical therapy of diabetes.  No changes today.

## 2011-01-10 NOTE — Assessment & Plan Note (Signed)
Stable without volume overload on exam  Normal ICD function See Pace Art report No changes today  

## 2011-01-10 NOTE — Assessment & Plan Note (Signed)
S/p BIV pacing,  Doing well

## 2011-01-10 NOTE — Progress Notes (Deleted)
   Patient ID: [name], male    DOB: 1945-12-30, 65 y.o.   MRN: [id number]  HPI    Review of Systems    Physical Exam

## 2011-01-10 NOTE — Assessment & Plan Note (Addendum)
No ischemic symptoms The importance of adequate glycemic control was discussed at length today.

## 2011-04-04 ENCOUNTER — Other Ambulatory Visit: Payer: Self-pay | Admitting: *Deleted

## 2011-04-04 MED ORDER — LISINOPRIL 10 MG PO TABS: 10.0000 mg | ORAL_TABLET | Freq: Every day | ORAL | Status: DC

## 2011-04-04 NOTE — Telephone Encounter (Signed)
Left message for patient to call office regarding refills needed and also that patient needed follow up with [facility].

## 2011-04-25 LAB — POCT I-STAT 3, ART BLOOD GAS (G3+)
Acid-Base Excess: 5 — ABNORMAL HIGH
Acid-Base Excess: 5 — ABNORMAL HIGH
Bicarbonate: 27.2 — ABNORMAL HIGH
O2 Saturation: 89
O2 Saturation: 96
Operator id: 118461
Operator id: 118461
TCO2: 28
TCO2: 31
pCO2 arterial: 41.7
pH, Arterial: 7.454 — ABNORMAL HIGH

## 2011-04-25 LAB — BASIC METABOLIC PANEL
BUN: 15
CO2: 29
Calcium: 8.2 — ABNORMAL LOW
Calcium: 8.4
Chloride: 100
Chloride: 98
Creatinine, Ser: 1.19
GFR calc Af Amer: >60
GFR calc non Af Amer: >60
GFR calc non Af Amer: >60
Glucose, Bld: 121 — ABNORMAL HIGH
Glucose, Bld: 80
Potassium: 3.7
Potassium: 3.9
Sodium: 137

## 2011-04-25 LAB — CARDIAC PANEL(CRET KIN+CKTOT+MB+TROPI)
CK, MB: 10 — ABNORMAL HIGH
Relative Index: 2.9 — ABNORMAL HIGH
Total CK: 300 — ABNORMAL HIGH
Troponin I: 0.05

## 2011-04-25 LAB — EXPECTORATED SPUTUM ASSESSMENT W GRAM STAIN, RFLX TO RESP C

## 2011-04-25 LAB — POCT I-STAT 3, VENOUS BLOOD GAS (G3P V)
Operator id: 118461
TCO2: 30
pCO2, Ven: 42.7 — ABNORMAL LOW
pH, Ven: 7.432 — ABNORMAL HIGH
pO2, Ven: 32

## 2011-04-25 LAB — CBC
Platelets: 259
RDW: 13.1
WBC: 7.4

## 2011-04-25 LAB — B-NATRIURETIC PEPTIDE (CONVERTED LAB): Pro B Natriuretic peptide (BNP): 965 — ABNORMAL HIGH

## 2011-04-26 LAB — BASIC METABOLIC PANEL
Chloride: 99
GFR calc Af Amer: >60
GFR calc non Af Amer: >60
Potassium: 4.2

## 2011-04-26 LAB — DIGOXIN LEVEL: Digoxin Level: 0.7 — ABNORMAL LOW

## 2011-05-02 ENCOUNTER — Encounter: Payer: BLUE CROSS/BLUE SHIELD | Admitting: *Deleted

## 2011-05-10 LAB — CBC
HCT: 39.6
MCHC: 33.1
Platelets: 258
RDW: 13

## 2011-05-10 LAB — COMPREHENSIVE METABOLIC PANEL
Albumin: 3.5
BUN: 26 — ABNORMAL HIGH
Calcium: 8.5
GFR calc non Af Amer: 49 — ABNORMAL LOW
Glucose, Bld: 123 — ABNORMAL HIGH
Potassium: 3.9
Total Bilirubin: 0.7
Total Protein: 6.3

## 2011-05-10 LAB — CARDIAC PANEL(CRET KIN+CKTOT+MB+TROPI)
CK, MB: 10.8 — ABNORMAL HIGH
Relative Index: 3.2 — ABNORMAL HIGH
Troponin I: 0.06

## 2011-05-31 ENCOUNTER — Ambulatory Visit (INDEPENDENT_AMBULATORY_CARE_PROVIDER_SITE_OTHER): Payer: BLUE CROSS/BLUE SHIELD | Admitting: *Deleted

## 2011-05-31 ENCOUNTER — Encounter: Payer: Self-pay | Admitting: Internal Medicine

## 2011-05-31 DIAGNOSIS — I429 Cardiomyopathy, unspecified: Secondary | ICD-10-CM

## 2011-05-31 DIAGNOSIS — I5022 Chronic systolic (congestive) heart failure: Secondary | ICD-10-CM

## 2011-05-31 DIAGNOSIS — I428 Other cardiomyopathies: Secondary | ICD-10-CM

## 2011-05-31 DIAGNOSIS — I4891 Unspecified atrial fibrillation: Secondary | ICD-10-CM

## 2011-05-31 LAB — ICD DEVICE OBSERVATION
AL THRESHOLD: 0.5 V
BAMS-0003: 70 {beats}/min
BATTERY VOLTAGE: 2.6019 V
DEVICE MODEL ICD: 539316
FVT: 0
HV IMPEDENCE: 47 Ohm
PACEART VT: 0
RV LEAD AMPLITUDE: 12 mv
TOT-0009: 1
TOT-0010: 20
TZAT-0001SLOWVT: 1
TZAT-0012SLOWVT: 200 ms
TZAT-0018SLOWVT: NEGATIVE
TZAT-0019SLOWVT: 7.5 V
TZAT-0020SLOWVT: 1 ms
TZON-0004SLOWVT: 12
TZON-0005SLOWVT: 6
TZON-0010SLOWVT: 60 ms
TZST-0001SLOWVT: 4
TZST-0003SLOWVT: 25 J

## 2011-05-31 NOTE — Progress Notes (Signed)
icd check in clinic  

## 2011-06-07 ENCOUNTER — Ambulatory Visit (INDEPENDENT_AMBULATORY_CARE_PROVIDER_SITE_OTHER): Payer: BC Managed Care – PPO | Admitting: Cardiology

## 2011-06-07 ENCOUNTER — Encounter: Payer: Self-pay | Admitting: Cardiology

## 2011-06-07 VITALS — BP 109/76 | HR 76 | Ht 72.0 in | Wt 212.0 lb

## 2011-06-07 DIAGNOSIS — I4891 Unspecified atrial fibrillation: Secondary | ICD-10-CM

## 2011-06-07 DIAGNOSIS — G4733 Obstructive sleep apnea (adult) (pediatric): Secondary | ICD-10-CM

## 2011-06-07 DIAGNOSIS — Z95810 Presence of automatic (implantable) cardiac defibrillator: Secondary | ICD-10-CM

## 2011-06-07 DIAGNOSIS — I420 Dilated cardiomyopathy: Secondary | ICD-10-CM

## 2011-06-07 DIAGNOSIS — I5022 Chronic systolic (congestive) heart failure: Secondary | ICD-10-CM

## 2011-06-07 DIAGNOSIS — E119 Type 2 diabetes mellitus without complications: Secondary | ICD-10-CM

## 2011-06-07 DIAGNOSIS — I428 Other cardiomyopathies: Secondary | ICD-10-CM

## 2011-06-07 DIAGNOSIS — I2510 Atherosclerotic heart disease of native coronary artery without angina pectoris: Secondary | ICD-10-CM

## 2011-06-07 NOTE — Patient Instructions (Signed)
Continue all current medications. Your physician wants you to follow up in:  1 year.  You will receive a reminder letter in the mail one-two months in advance.  If you don't receive a letter, please call our office to schedule the follow up appointment   

## 2011-06-09 DIAGNOSIS — G4733 Obstructive sleep apnea (adult) (pediatric): Secondary | ICD-10-CM | POA: Insufficient documentation

## 2011-06-09 DIAGNOSIS — Z95810 Presence of automatic (implantable) cardiac defibrillator: Secondary | ICD-10-CM | POA: Insufficient documentation

## 2011-06-09 NOTE — Progress Notes (Signed)
CC: Followup heart failure and ICD implantation  HPI: The patient is a 65 year old male with a history of nonischemic cardiomyopathy, status post CRT-D, single episode of atrial fibrillation but no recurrent atrial fibrillation by device interrogation, possible obstructive sleep apnea but declined sleep study and also declined Coumadin. Has diabetes mellitus with poor blood sugar control. From a cardiac perspective he is doing well. He denies any chest pain or shortness of breath. His in NYHA class 1-2. He denies any chest pain. He had a negative catheterization 2009. He denies any shortness of breath orthopnea PND. He has no palpitations or syncope.  PMH: reviewed and listed in Problem List in Electronic Records (and see below)  Allergies/SH/FHX : available in Electronic Records for review  Medications: Current Outpatient Prescriptions  Medication Sig Dispense Refill  . aspirin 81 MG tablet Take 1 tablet by mouth daily.       . carvedilol (COREG) 25 MG tablet Take 1 tablet by mouth two times a day.       . Cinnamon 500 MG TABS Take 1 tablet by mouth daily.       . digoxin (LANOXIN) 0.125 MG tablet Take 1 tablet by mouth daily.       . furosemide (LASIX) 40 MG tablet Take 1/2 tablet by mouth two times a day.        . lisinopril (PRINIVIL,ZESTRIL) 10 MG tablet Take 1 tablet (10 mg total) by mouth daily.  90 tablet  0  . simvastatin (ZOCOR) 40 MG tablet Take 1 tablet by mouth every night.       . vitamin B-12 (CYANOCOBALAMIN) 500 MCG tablet Take 1 tablet by mouth daily.         ROS: No nausea or vomiting. No fever or chills.No melena or hematochezia.No bleeding.No claudication  Physical Exam: BP 109/76  Pulse 76  Ht 6' (1.829 m)  Wt 212 lb (96.163 kg)  BMI 28.75 kg/m2 General: Well-nourished white male overweight but in no distress Neck: JVD is 5 cm, no abdominal jugular reflux, no thyromegaly. Nonnodular thyroid. Normal carotid upstroke and no carotid bruits Lungs: Clear breath  sounds bilaterally without any wheezing Cardiac: Regular rate and rhythm with normal S1-S2 no pathological murmurs. No S3 Vascular: No edema no cyanosis or clubbing. Her soft visit posterior tibial pulses are 2+ bilaterally Skin: Warm and dry Affect: Normal  12lead ECG: Not available Limited bedside ECHO: Left ventricular systolic function approximately 45-50%. No significant mitral regurgitation  Assessment and Plan

## 2011-06-09 NOTE — Assessment & Plan Note (Signed)
Issue of anticoagulation has been discussed with Dr. Johney Frame in June of 2012. The patient was reluctant to go on Coumadin. We have again discussed a during this office visit.

## 2011-06-09 NOTE — Assessment & Plan Note (Signed)
Patient needs to discuss therapy with his primary care physician. He is currently not on hypoglycemic medications are insulin.

## 2011-06-09 NOTE — Assessment & Plan Note (Signed)
No evidence of volume overload. Continue medical therapy.

## 2011-06-09 NOTE — Assessment & Plan Note (Signed)
No significant coronary artery disease. No recurrent symptoms of chest pain.

## 2011-06-09 NOTE — Assessment & Plan Note (Signed)
Followed by Dr. Johney Frame. No defibrillator discharges.

## 2011-06-09 NOTE — Assessment & Plan Note (Signed)
Continue current medical regimen including ACE inhibitor, beta blocker digoxin and spironolactone.

## 2011-08-03 ENCOUNTER — Other Ambulatory Visit: Payer: Self-pay | Admitting: Cardiology

## 2011-08-05 ENCOUNTER — Other Ambulatory Visit: Payer: Self-pay | Admitting: *Deleted

## 2011-08-05 MED ORDER — FUROSEMIDE 40 MG PO TABS: 20.0000 mg | ORAL_TABLET | Freq: Two times a day (BID) | ORAL | Status: DC

## 2011-08-05 MED ORDER — DIGOXIN 125 MCG PO TABS: 125.0000 ug | ORAL_TABLET | Freq: Every day | ORAL | Status: DC

## 2011-08-05 MED ORDER — SIMVASTATIN 40 MG PO TABS: 40.0000 mg | ORAL_TABLET | Freq: Every day | ORAL | Status: DC

## 2011-08-05 MED ORDER — CARVEDILOL 25 MG PO TABS: 25.0000 mg | ORAL_TABLET | Freq: Two times a day (BID) | ORAL | Status: DC

## 2011-09-20 ENCOUNTER — Telehealth: Payer: Self-pay | Admitting: Internal Medicine

## 2011-09-20 NOTE — Telephone Encounter (Signed)
09-20-11 LMM @ 848a for pt to call [facility] and set up defib ck with device clinic/mt

## 2011-10-09 ENCOUNTER — Encounter: Payer: Self-pay | Admitting: Internal Medicine

## 2011-10-09 ENCOUNTER — Ambulatory Visit (INDEPENDENT_AMBULATORY_CARE_PROVIDER_SITE_OTHER): Payer: BC Managed Care – PPO | Admitting: *Deleted

## 2011-10-09 DIAGNOSIS — I4891 Unspecified atrial fibrillation: Secondary | ICD-10-CM

## 2011-10-09 DIAGNOSIS — Z95810 Presence of automatic (implantable) cardiac defibrillator: Secondary | ICD-10-CM

## 2011-10-09 DIAGNOSIS — I428 Other cardiomyopathies: Secondary | ICD-10-CM

## 2011-10-09 LAB — ICD DEVICE OBSERVATION
AL AMPLITUDE: 5 mv
AL IMPEDENCE ICD: 425 Ohm
BAMS-0003: 70 {beats}/min
BATTERY VOLTAGE: 2.5869 V
DEVICE MODEL ICD: 539316
HV IMPEDENCE: 50 Ohm
LV LEAD IMPEDENCE ICD: 375 Ohm
RV LEAD IMPEDENCE ICD: 712.5 Ohm
TOT-0006: 20090511000000
TOT-0007: 1
TOT-0008: 0
TOT-0010: 22
TZAT-0012SLOWVT: 200 ms
TZAT-0013SLOWVT: 3
TZAT-0018SLOWVT: NEGATIVE
TZAT-0020SLOWVT: 1 ms
TZON-0003SLOWVT: 300 ms
TZON-0005SLOWVT: 6
TZST-0001SLOWVT: 2
TZST-0001SLOWVT: 5
TZST-0003SLOWVT: 36 J
VF: 0

## 2011-10-09 NOTE — Progress Notes (Signed)
icd check in clinic  

## 2011-10-28 ENCOUNTER — Telehealth: Payer: Self-pay | Admitting: *Deleted

## 2011-10-28 ENCOUNTER — Encounter: Payer: Self-pay | Admitting: Internal Medicine

## 2011-10-28 ENCOUNTER — Ambulatory Visit (INDEPENDENT_AMBULATORY_CARE_PROVIDER_SITE_OTHER): Payer: BC Managed Care – PPO | Admitting: *Deleted

## 2011-10-28 DIAGNOSIS — I5022 Chronic systolic (congestive) heart failure: Secondary | ICD-10-CM

## 2011-10-28 DIAGNOSIS — I420 Dilated cardiomyopathy: Secondary | ICD-10-CM

## 2011-10-28 DIAGNOSIS — I428 Other cardiomyopathies: Secondary | ICD-10-CM

## 2011-10-28 LAB — ICD DEVICE OBSERVATION
AL IMPEDENCE ICD: 437.5 Ohm
ATRIAL PACING ICD: 1.3 pct
BAMS-0001: 150 {beats}/min
BATTERY VOLTAGE: 2.5869 V
HV IMPEDENCE: 42 Ohm
LV LEAD IMPEDENCE ICD: 437.5 Ohm
RV LEAD IMPEDENCE ICD: 662.5 Ohm
TOT-0006: 20090511000000
TOT-0007: 1
TOT-0008: 0
TOT-0009: 1
TOT-0010: 26
TZAT-0001SLOWVT: 1
TZAT-0004SLOWVT: 8
TZAT-0018SLOWVT: NEGATIVE
TZAT-0019SLOWVT: 7.5 V
TZAT-0020SLOWVT: 1 ms
TZON-0003SLOWVT: 300 ms
TZON-0004SLOWVT: 30
TZON-0005SLOWVT: 6
TZON-0010SLOWVT: 60 ms
TZST-0001SLOWVT: 3
TZST-0001SLOWVT: 4
TZST-0003SLOWVT: 36 J

## 2011-10-28 NOTE — Progress Notes (Signed)
ICD interrogation
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
## 2011-11-29 ENCOUNTER — Telehealth: Payer: Self-pay | Admitting: Cardiology

## 2011-11-29 MED ORDER — DIGOXIN 125 MCG PO TABS: 125.0000 ug | ORAL_TABLET | Freq: Every day | ORAL | Status: DC

## 2011-11-29 NOTE — Telephone Encounter (Signed)
Please call [name] in regards to a question about his digoxin.

## 2011-11-29 NOTE — Telephone Encounter (Signed)
Called number that was provided to call patient back, and phone was answered and no one ever said hello, then hung phone up. Called and spoke with wife, wife said that [facility] was trying to give them lanoxin @$74 instead of digoxin. Nurse informed her that we didn't change to name brand and wasn't sure why they are changing it. Nurse informed wife that our office would contact [facility] to fix issue. Nurse called [facility] and spoke with pharmacist, she said the rx was transferred to them with DAW. Nurse informed pharmacist to cancel that rx and nurse called in new rx.

## 2012-01-30 ENCOUNTER — Encounter: Payer: Self-pay | Admitting: Internal Medicine

## 2012-01-30 ENCOUNTER — Ambulatory Visit (INDEPENDENT_AMBULATORY_CARE_PROVIDER_SITE_OTHER): Payer: Medicare Other | Admitting: Internal Medicine

## 2012-01-30 VITALS — BP 112/70 | HR 76 | Resp 18 | Ht 72.0 in | Wt 220.0 lb

## 2012-01-30 DIAGNOSIS — I4891 Unspecified atrial fibrillation: Secondary | ICD-10-CM

## 2012-01-30 DIAGNOSIS — I2510 Atherosclerotic heart disease of native coronary artery without angina pectoris: Secondary | ICD-10-CM

## 2012-01-30 DIAGNOSIS — I5022 Chronic systolic (congestive) heart failure: Secondary | ICD-10-CM

## 2012-01-30 LAB — ICD DEVICE OBSERVATION
AL IMPEDENCE ICD: 450 Ohm
BAMS-0003: 70 {beats}/min
BATTERY VOLTAGE: 2.5869 V
DEVICE MODEL ICD: 539316
HV IMPEDENCE: 51 Ohm
LV LEAD IMPEDENCE ICD: 525 Ohm
TOT-0006: 20090511000000
TOT-0007: 1
TOT-0008: 0
TOT-0009: 1
TOT-0010: 27
TZAT-0012SLOWVT: 200 ms
TZAT-0013SLOWVT: 3
TZAT-0020SLOWVT: 1 ms
TZON-0003SLOWVT: 300 ms
TZON-0004SLOWVT: 30
TZON-0005SLOWVT: 6
TZST-0001SLOWVT: 4
TZST-0001SLOWVT: 5
TZST-0003SLOWVT: 36 J

## 2012-01-30 NOTE — Assessment & Plan Note (Signed)
No further afib I again discussed the importance of anticoagulation due to his high CHADS2 score.  Unfortunately, he is unwilling to take coumadin or a novel anticoagulant due to concerns of bleeding.  I therefore recommended Apixaban which had bleeding risks similar to ASA with significant stroke prevention and discussed results/ implications of the AVERROES study with him at length.  He states that he will not consider any anticoagulants at this time. He should continue to discuss with Dr Andee Lineman.  I worry that his stroke risks are quite elevated given CHF and DM.

## 2012-01-30 NOTE — Progress Notes (Signed)
PCP:  [name], MD  The patient presents today for routine electrophysiology followup.  I saw him several months ago in the device clinic after having received an ICD shock for atrial fibrillation with RVR.  He had been off of all of his medicines at that time.  He has had no further afib.  He reports compliance with medicines.  He is doing very well presently.  Today, he denies symptoms of palpitations, chest pain, shortness of breath, orthopnea, PND, lower extremity edema, dizziness, presyncope, syncope, or neurologic sequela.  The patient feels that he is tolerating medications without difficulties and is otherwise without complaint today.   Past Medical History  Diagnosis Date  . CAD (coronary artery disease)     Minimal nonobstructive CAD by Cath 2009  . Systolic heart failure, chronic   . LBBB (left bundle branch block)   . Secondary cardiomyopathy   . Diabetes mellitus     noncompliant with medical therapy  . Atrial fibrillation     previously detected on ICD eval   Past Surgical History  Procedure Date  . Hernia repair   . Fiscula repair   . Hearing surgery   . Cholecystectomy   . Cardiac defibrillator placement 12/14/2007    St. Jude Promote RF 3207 ICD placed by Dr. Graciela Husbands for CHF, nonischemic cardiomyopathy    Current Outpatient Prescriptions  Medication Sig Dispense Refill  . aspirin 81 MG tablet Take 1 tablet by mouth daily.       . carvedilol (COREG) 25 MG tablet Take 1 tablet (25 mg total) by mouth 2 (two) times daily with a meal.  180 tablet  3  . Cinnamon 500 MG TABS Take 1 tablet by mouth daily.       . digoxin (LANOXIN) 0.125 MG tablet Take 1 tablet (125 mcg total) by mouth daily.  90 tablet  3  . furosemide (LASIX) 40 MG tablet Take 0.5 tablets (20 mg total) by mouth 2 (two) times daily.  90 tablet  3  . GLIPIZIDE PO Take by mouth daily.      Marland Kitchen lisinopril (PRINIVIL,ZESTRIL) 10 MG tablet TAKE 1 TABLET DAILY  90 tablet  3  . simvastatin (ZOCOR) 40 MG  tablet Take 1 tablet (40 mg total) by mouth at bedtime. Take 1 tablet by mouth every night.  90 tablet  0  . vitamin B-12 (CYANOCOBALAMIN) 500 MCG tablet Take 1 tablet by mouth daily.         Allergies  Allergen Reactions  . Penicillins     History   Social History  . Marital Status: Married    Spouse Name: N/A    Number of Children: N/A  . Years of Education: N/A   Occupational History  . Retired    Social History Main Topics  . Smoking status: Former Smoker -- 0.3 packs/day for 5 years    Types: Cigarettes    Quit date: 08/05/1965  . Smokeless tobacco: Never Used  . Alcohol Use: No  . Drug Use: No  . Sexually Active: Not on file   Other Topics Concern  . Not on file   Social History Narrative  . No narrative on file    Family History  Problem Relation Age of Onset  . Cancer    . Coronary artery disease    . Diabetes        Physical Exam: Filed Vitals:   01/30/12 1056  BP: 112/70  Pulse: 76  Resp: 18  Height:  6' (1.829 m)  Weight: 220 lb (99.791 kg)    GEN- The patient is well appearing, alert and oriented x 3 today.   Head- normocephalic, atraumatic Eyes-  Sclera clear, conjunctiva pink Ears- hearing intact Oropharynx- clear Neck- supple, no JVP Lymph- no cervical lymphadenopathy Lungs- Clear to ausculation bilaterally, normal work of breathing Chest- ICD pocket is well healed Heart- Regular rate and rhythm, no murmurs, rubs or gallops, PMI not laterally displaced GI- soft, NT, ND, + BS Extremities- no clubbing, cyanosis, or edema MS- no significant deformity or atrophy Skin- no rash or lesion Psych- euthymic mood, full affect Neuro- strength and sensation are intact  ICD interrogation- reviewed in detail today,  See PACEART report  Assessment and Plan:

## 2012-01-30 NOTE — Patient Instructions (Addendum)
Device check in 3 months with Kristen.  Your physician recommends that you schedule a follow-up appointment in: 1 year with Dr. Johney Frame. You will receive a reminder letter in the mail in about 10 months reminding you to call and schedule your appointment. If you don't receive this letter, please contact our office.

## 2012-01-30 NOTE — Assessment & Plan Note (Signed)
No ischemic symptoms 

## 2012-01-30 NOTE — Assessment & Plan Note (Addendum)
euvolemic today Doing reasonably well Compliance with medicines and device follow-up again encouraged today  Normal BiV ICD function See Pace Art report No changes today

## 2012-04-07 ENCOUNTER — Encounter: Payer: Self-pay | Admitting: *Deleted

## 2012-04-17 ENCOUNTER — Encounter: Payer: Self-pay | Admitting: Internal Medicine

## 2012-04-17 ENCOUNTER — Ambulatory Visit (INDEPENDENT_AMBULATORY_CARE_PROVIDER_SITE_OTHER): Payer: Medicare Other | Admitting: *Deleted

## 2012-04-17 DIAGNOSIS — Z95810 Presence of automatic (implantable) cardiac defibrillator: Secondary | ICD-10-CM

## 2012-04-17 DIAGNOSIS — I4891 Unspecified atrial fibrillation: Secondary | ICD-10-CM

## 2012-04-17 LAB — ICD DEVICE OBSERVATION
AL IMPEDENCE ICD: 412.5 Ohm
ATRIAL PACING ICD: 7.2 pct
BAMS-0001: 150 {beats}/min
CHARGE TIME: 14.1 s
LV LEAD IMPEDENCE ICD: 400 Ohm
LV LEAD THRESHOLD: 1.25 V
RV LEAD IMPEDENCE ICD: 737.5 Ohm
TOT-0006: 20090511000000
TOT-0007: 1
TOT-0008: 0
TZAT-0004SLOWVT: 8
TZAT-0013SLOWVT: 3
TZAT-0018SLOWVT: NEGATIVE
TZON-0003SLOWVT: 300 ms
TZST-0001SLOWVT: 3
TZST-0001SLOWVT: 5
TZST-0003SLOWVT: 36 J
TZST-0003SLOWVT: 36 J
VENTRICULAR PACING ICD: 99.95 pct

## 2012-04-17 NOTE — Progress Notes (Signed)
defib check in clinic  

## 2012-06-09 ENCOUNTER — Encounter: Payer: Self-pay | Admitting: Cardiology

## 2012-06-09 ENCOUNTER — Ambulatory Visit (INDEPENDENT_AMBULATORY_CARE_PROVIDER_SITE_OTHER): Payer: Medicare Other | Admitting: Cardiology

## 2012-06-09 VITALS — BP 117/76 | HR 79 | Ht 72.0 in | Wt 221.8 lb

## 2012-06-09 DIAGNOSIS — Z9119 Patient's noncompliance with other medical treatment and regimen: Secondary | ICD-10-CM

## 2012-06-09 DIAGNOSIS — I4891 Unspecified atrial fibrillation: Secondary | ICD-10-CM

## 2012-06-09 DIAGNOSIS — Z91199 Patient's noncompliance with other medical treatment and regimen due to unspecified reason: Secondary | ICD-10-CM

## 2012-06-09 DIAGNOSIS — I420 Dilated cardiomyopathy: Secondary | ICD-10-CM

## 2012-06-09 DIAGNOSIS — I2510 Atherosclerotic heart disease of native coronary artery without angina pectoris: Secondary | ICD-10-CM

## 2012-06-09 DIAGNOSIS — I428 Other cardiomyopathies: Secondary | ICD-10-CM

## 2012-06-09 DIAGNOSIS — Z95810 Presence of automatic (implantable) cardiac defibrillator: Secondary | ICD-10-CM

## 2012-06-09 MED ORDER — DIGOXIN 125 MCG PO TABS: 125.0000 ug | ORAL_TABLET | Freq: Every day | ORAL | Status: DC

## 2012-06-09 MED ORDER — SIMVASTATIN 40 MG PO TABS: 40.0000 mg | ORAL_TABLET | Freq: Every day | ORAL | Status: DC

## 2012-06-09 MED ORDER — FUROSEMIDE 40 MG PO TABS: 20.0000 mg | ORAL_TABLET | Freq: Two times a day (BID) | ORAL | Status: DC

## 2012-06-09 MED ORDER — CARVEDILOL 25 MG PO TABS: 25.0000 mg | ORAL_TABLET | Freq: Two times a day (BID) | ORAL | Status: DC

## 2012-06-09 MED ORDER — LISINOPRIL 10 MG PO TABS: 10.0000 mg | ORAL_TABLET | Freq: Every day | ORAL | Status: DC

## 2012-06-09 NOTE — Assessment & Plan Note (Signed)
Minimal nonobstructive disease at catheterization in 2009, no active angina.

## 2012-06-09 NOTE — Assessment & Plan Note (Signed)
Patient has consistently declined anticoagulation for stroke prophylaxis, and this remains the case today. He agrees to stay on aspirin.

## 2012-06-09 NOTE — Patient Instructions (Addendum)

## 2012-06-09 NOTE — Assessment & Plan Note (Signed)
Device discharge related to rapid atrial fibrillation in the setting of medication noncompliance. He is being followed by Dr. Johney Frame.

## 2012-06-09 NOTE — Assessment & Plan Note (Signed)
I reinforced regular medication use, provide refills today.

## 2012-06-09 NOTE — Assessment & Plan Note (Signed)
Reportedly LVEF 40-50% as of October 2012, cannot locate report. Patient declines followup echocardiogram, and we will therefore continue medical therapy and observation for now.

## 2012-06-09 NOTE — Progress Notes (Signed)
Clinical Summary [name] is a 66 y.o.male presenting for office followup. He is a former patient of [name], prefers to stay with [name]. He was last seen in November 2012, more recently by [name] in June in this year. History includes device shock due to rapid atrial fibrillation in the setting of noncompliance with medications.  He reports no angina, palpitations, syncope, or subsequent device discharges since evaluation by [name]. He reports NYHA class 2-3 dyspnea on exertion, has not been exercising as regularly. Sometimes rides a stationary bicycle for 10 minutes at home.  Echocardiogram in 2010 revealed LVEF 30-35% with grade 1 diastolic dysfunction, mild mitral regurgitation. [name] records indicate improvement in LV function to the range of 40-50% as of October 2012, report not found..  He has consistently declined anticoagulant therapy for treatment of atrial fibrillation and stroke prophylaxis over time. This remains the case.  He states that he has routine lab work with [name].   Allergies  Allergen Reactions  . Penicillins     Current Outpatient Prescriptions  Medication Sig Dispense Refill  . aspirin 81 MG tablet Take 1 tablet by mouth daily.       . carvedilol (COREG) 25 MG tablet Take 1 tablet (25 mg total) by mouth 2 (two) times daily with a meal.  180 tablet  3  . Cinnamon 500 MG TABS Take 1 tablet by mouth daily.       . digoxin (LANOXIN) 0.125 MG tablet Take 1 tablet (125 mcg total) by mouth daily.  90 tablet  3  . Echinacea 400 MG CAPS Take 1 capsule by mouth every morning.      . furosemide (LASIX) 40 MG tablet Take 0.5 tablets (20 mg total) by mouth 2 (two) times daily.  90 tablet  3  . glipiZIDE (GLUCOTROL XL) 10 MG 24 hr tablet Take 10 mg by mouth 2 (two) times daily.      Marland Kitchen lisinopril (PRINIVIL,ZESTRIL) 10 MG tablet Take 1 tablet (10 mg total) by mouth daily.  90 tablet  3  . simvastatin (ZOCOR) 40 MG tablet Take 1 tablet (40 mg  total) by mouth at bedtime.  90 tablet  3  . vitamin B-12 (CYANOCOBALAMIN) 500 MCG tablet Take 1 tablet by mouth daily.       . [DISCONTINUED] carvedilol (COREG) 25 MG tablet Take 1 tablet (25 mg total) by mouth 2 (two) times daily with a meal.  180 tablet  3  . [DISCONTINUED] digoxin (LANOXIN) 0.125 MG tablet Take 1 tablet (125 mcg total) by mouth daily.  90 tablet  3  . [DISCONTINUED] furosemide (LASIX) 40 MG tablet Take 0.5 tablets (20 mg total) by mouth 2 (two) times daily.  90 tablet  3  . [DISCONTINUED] lisinopril (PRINIVIL,ZESTRIL) 10 MG tablet TAKE 1 TABLET DAILY  90 tablet  3  . [DISCONTINUED] simvastatin (ZOCOR) 40 MG tablet Take 1 tablet (40 mg total) by mouth at bedtime. Take 1 tablet by mouth every night.  90 tablet  0  . [DISCONTINUED] simvastatin (ZOCOR) 40 MG tablet Take 40 mg by mouth at bedtime.        Past Medical History  Diagnosis Date  . CAD (coronary artery disease)     Minimal nonobstructive CAD at catheterization 2009  . Chronic systolic heart failure   . LBBB (left bundle branch block)   . Secondary cardiomyopathy     Nonischemic  . Type 2 diabetes mellitus   . Atrial fibrillation  Declines anticoagulation    Social History [name] reports that he quit smoking about 46 years ago. His smoking use included Cigarettes. He has a 1.5 pack-year smoking history. He has never used smokeless tobacco. [name] reports that he does not drink alcohol.  Review of Systems Sometimes tingling in the legs, no palpitations or syncope. No reported bleeding episodes. Stable appetite. No orthopnea or PND. Denies any interval hospitalizations.  Physical Examination Filed Vitals:   06/09/12 1259  BP: 117/76  Pulse: 79   Filed Weights   06/09/12 1259  Weight: 221 lb 12.8 oz (100.608 kg)   Patient in no acute distress. HEENT: Conjunctiva and lids normal, oropharynx clear. Neck: Supple, no elevated JVP or carotid bruits, no thyromegaly. Lungs: Clear to  auscultation, nonlabored breathing at rest. Cardiac: Regular rate and rhythm, no S3 or significant systolic murmur, no pericardial rub. Thorax: Stable device pocket site. Abdomen: Soft, nontender, bowel sounds present. Extremities: No pitting edema, distal pulses 1-2+. Skin: Warm and dry. Musculoskeletal: No kyphosis. Neuropsychiatric: Alert and oriented x3, affect grossly appropriate.   Problem List and Plan   Nonischemic dilated cardiomyopathy Reportedly LVEF 40-50% as of October 2012, cannot locate report. Patient declines followup echocardiogram, and we will therefore continue medical therapy and observation for now.  ICD-St.Jude Device discharge related to rapid atrial fibrillation in the setting of medication noncompliance. He is being followed by [name].  CAD, NATIVE VESSEL Minimal nonobstructive disease at catheterization in 2009, no active angina.  ATRIAL FIBRILLATION, PAROXYSMAL Patient has consistently declined anticoagulation for stroke prophylaxis, and this remains the case today. He agrees to stay on aspirin.  Noncompliance I reinforced regular medication use, provide refills today.    [name], M.D., F.A.C.C.

## 2012-07-17 ENCOUNTER — Ambulatory Visit (INDEPENDENT_AMBULATORY_CARE_PROVIDER_SITE_OTHER): Payer: Medicare Other | Admitting: *Deleted

## 2012-07-17 ENCOUNTER — Encounter: Payer: Self-pay | Admitting: Internal Medicine

## 2012-07-17 DIAGNOSIS — I4891 Unspecified atrial fibrillation: Secondary | ICD-10-CM

## 2012-07-17 DIAGNOSIS — Z95810 Presence of automatic (implantable) cardiac defibrillator: Secondary | ICD-10-CM

## 2012-07-17 LAB — ICD DEVICE OBSERVATION
AL AMPLITUDE: 4.8 mv
BATTERY VOLTAGE: 2.5718 V
DEVICE MODEL ICD: 539316
MODE SWITCH EPISODES: 3
PACEART VT: 0
RV LEAD THRESHOLD: 0.5 V
TOT-0007: 1
TOT-0009: 1
TOT-0010: 28
TZAT-0013SLOWVT: 3
TZAT-0018SLOWVT: NEGATIVE
TZAT-0019SLOWVT: 7.5 V
TZAT-0020SLOWVT: 1 ms
TZON-0004SLOWVT: 30
TZON-0005SLOWVT: 6
TZST-0001SLOWVT: 2
TZST-0001SLOWVT: 4
TZST-0003SLOWVT: 36 J
VF: 0

## 2012-07-17 NOTE — Progress Notes (Signed)
defib check in clinic  

## 2013-01-21 ENCOUNTER — Encounter (HOSPITAL_COMMUNITY): Payer: Self-pay | Admitting: Pharmacy Technician

## 2013-01-26 NOTE — Patient Instructions (Addendum)
Your procedure is scheduled on: 02/04/2013  Report to [facility] at  1000  AM.  Call this number if you have problems the morning of surgery: [phone]   Do not eat food or drink liquids :After Midnight.      Take these medicines the morning of surgery with A SIP OF WATER: coreg,lanoxin, lisinopril   Do not wear jewelry, make-up or nail polish.  Do not wear lotions, powders, or perfumes.   Do not shave 48 hours prior to surgery.  Do not bring valuables to the hospital.  Contacts, dentures or bridgework may not be worn into surgery.  Leave suitcase in the car. After surgery it may be brought to your room.  For patients admitted to the hospital, checkout time is 11:00 AM the day of discharge.   Patients discharged the day of surgery will not be allowed to drive home.  :     Please read over the following fact sheets that you were given: Coughing and Deep Breathing, Surgical Site Infection Prevention, Anesthesia Post-op Instructions and Care and Recovery After Surgery    Cataract A cataract is a clouding of the lens of the eye. When a lens becomes cloudy, vision is reduced based on the degree and nature of the clouding. Many cataracts reduce vision to some degree. Some cataracts make people more near-sighted as they develop. Other cataracts increase glare. Cataracts that are ignored and become worse can sometimes look white. The white color can be seen through the pupil. CAUSES   Aging. However, cataracts may occur at any age, even in newborns.   Certain drugs.   Trauma to the eye.   Certain diseases such as diabetes.   Specific eye diseases such as chronic inflammation inside the eye or a sudden attack of a rare form of glaucoma.   Inherited or acquired medical problems.  SYMPTOMS   Gradual, progressive drop in vision in the affected eye.   Severe, rapid visual loss. This most often happens when trauma is the cause.  DIAGNOSIS  To detect a cataract, an eye doctor examines the  lens. Cataracts are best diagnosed with an exam of the eyes with the pupils enlarged (dilated) by drops.  TREATMENT  For an early cataract, vision may improve by using different eyeglasses or stronger lighting. If that does not help your vision, surgery is the only effective treatment. A cataract needs to be surgically removed when vision loss interferes with your everyday activities, such as driving, reading, or watching TV. A cataract may also have to be removed if it prevents examination or treatment of another eye problem. Surgery removes the cloudy lens and usually replaces it with a substitute lens (intraocular lens, IOL).  At a time when both you and your doctor agree, the cataract will be surgically removed. If you have cataracts in both eyes, only one is usually removed at a time. This allows the operated eye to heal and be out of danger from any possible problems after surgery (such as infection or poor wound healing). In rare cases, a cataract may be doing damage to your eye. In these cases, your caregiver may advise surgical removal right away. The vast majority of people who have cataract surgery have better vision afterward. HOME CARE INSTRUCTIONS  If you are not planning surgery, you may be asked to do the following:  Use different eyeglasses.   Use stronger or brighter lighting.   Ask your eye doctor about reducing your medicine dose or changing medicines  if it is thought that a medicine caused your cataract. Changing medicines does not make the cataract go away on its own.   Become familiar with your surroundings. Poor vision can lead to injury. Avoid bumping into things on the affected side. You are at a higher risk for tripping or falling.   Exercise extreme care when driving or operating machinery.   Wear sunglasses if you are sensitive to bright light or experiencing problems with glare.  SEEK IMMEDIATE MEDICAL CARE IF:   You have a worsening or sudden vision loss.   You  notice redness, swelling, or increasing pain in the eye.   You have a fever.  Document Released: 07/22/2005 Document Revised: 07/11/2011 Document Reviewed: 03/15/2011 [facility] Patient Information 2012 [place].PATIENT INSTRUCTIONS POST-ANESTHESIA  IMMEDIATELY FOLLOWING SURGERY:  Do not drive or operate machinery for the first twenty four hours after surgery.  Do not make any important decisions for twenty four hours after surgery or while taking narcotic pain medications or sedatives.  If you develop intractable nausea and vomiting or a severe headache please notify your doctor immediately.  FOLLOW-UP:  Please make an appointment with your surgeon as instructed. You do not need to follow up with anesthesia unless specifically instructed to do so.  WOUND CARE INSTRUCTIONS (if applicable):  Keep a dry clean dressing on the anesthesia/puncture wound site if there is drainage.  Once the wound has quit draining you may leave it open to air.  Generally you should leave the bandage intact for twenty four hours unless there is drainage.  If the epidural site drains for more than 36-48 hours please call the anesthesia department.  QUESTIONS?:  Please feel free to call your physician or the hospital operator if you have any questions, and they will be happy to assist you.

## 2013-01-27 ENCOUNTER — Encounter (HOSPITAL_COMMUNITY): Payer: Self-pay

## 2013-01-27 ENCOUNTER — Encounter (HOSPITAL_COMMUNITY)
Admission: RE | Admit: 2013-01-27 | Discharge: 2013-01-27 | Disposition: A | Discharge status: Home / Self Care | Payer: Medicare Other | Source: Ambulatory Visit | Attending: Ophthalmology | Admitting: Ophthalmology

## 2013-01-27 DIAGNOSIS — H2510 Age-related nuclear cataract, unspecified eye: Secondary | ICD-10-CM | POA: Insufficient documentation

## 2013-01-27 DIAGNOSIS — Z5309 Procedure and treatment not carried out because of other contraindication: Secondary | ICD-10-CM | POA: Insufficient documentation

## 2013-01-27 DIAGNOSIS — Z01812 Encounter for preprocedural laboratory examination: Secondary | ICD-10-CM | POA: Insufficient documentation

## 2013-01-27 LAB — HEMOGLOBIN AND HEMATOCRIT, BLOOD: HCT: 40.6 % (ref 39.0–52.0)

## 2013-01-27 LAB — BASIC METABOLIC PANEL
CO2: 24 mEq/L (ref 19–32)
GFR calc non Af Amer: 74 mL/min — ABNORMAL LOW (ref >90)
Glucose, Bld: 464 mg/dL — ABNORMAL HIGH (ref 70–99)
Potassium: 4.3 mEq/L (ref 3.5–5.1)
Sodium: 132 mEq/L — ABNORMAL LOW (ref 135–145)

## 2013-01-28 NOTE — Pre-Procedure Instructions (Signed)
Patient in for PAT. Glucose is 464. On po meds for diabetes. Dr Jayme Cloud notified and wants patient under better control before surgery is done. Called Dr Ross Stores office and notified them of this. Also contacted Dr Dian Situ office and let them know. They are to call patient and get him in for evaluation.

## 2013-02-04 ENCOUNTER — Ambulatory Visit (HOSPITAL_COMMUNITY): Admission: RE | Admit: 2013-02-04 | Payer: Medicare Other | Source: Ambulatory Visit | Admitting: Ophthalmology

## 2013-02-04 ENCOUNTER — Encounter (HOSPITAL_COMMUNITY): Admission: RE | Payer: Self-pay | Source: Ambulatory Visit

## 2013-02-04 SURGERY — PHACOEMULSIFICATION, CATARACT, WITH IOL INSERTION
Anesthesia: Monitor Anesthesia Care | Site: Eye | Laterality: Right

## 2013-02-12 ENCOUNTER — Ambulatory Visit (INDEPENDENT_AMBULATORY_CARE_PROVIDER_SITE_OTHER): Payer: Medicare Other | Admitting: *Deleted

## 2013-02-12 DIAGNOSIS — I4891 Unspecified atrial fibrillation: Secondary | ICD-10-CM

## 2013-02-12 DIAGNOSIS — Z95810 Presence of automatic (implantable) cardiac defibrillator: Secondary | ICD-10-CM

## 2013-02-12 LAB — ICD DEVICE OBSERVATION
AL AMPLITUDE: 5 mv
BAMS-0003: 70 {beats}/min
BATTERY VOLTAGE: 2.5568 V
DEVICE MODEL ICD: 539316
HV IMPEDENCE: 52 Ohm
LV LEAD IMPEDENCE ICD: 425 Ohm
MODE SWITCH EPISODES: 2
RV LEAD IMPEDENCE ICD: 700 Ohm
TOT-0006: 20090511000000
TOT-0007: 1
TOT-0008: 0
TOT-0009: 1
TOT-0010: 31
TZAT-0012SLOWVT: 200 ms
TZAT-0013SLOWVT: 3
TZAT-0018SLOWVT: NEGATIVE
TZAT-0020SLOWVT: 1 ms
TZON-0003SLOWVT: 300 ms
TZON-0004SLOWVT: 30
TZON-0005SLOWVT: 6
TZST-0001SLOWVT: 2
TZST-0001SLOWVT: 4
TZST-0001SLOWVT: 5
TZST-0003SLOWVT: 36 J
VF: 0

## 2013-02-12 NOTE — Progress Notes (Signed)
icd check in clinic. Normal device function. No episodes recorded.2 mode switches--both lasting 4 seconds. Battery longevity 1.6 years. ROV in 3 mths w/JA in [place] office.

## 2013-02-19 ENCOUNTER — Encounter: Payer: Self-pay | Admitting: Internal Medicine

## 2013-04-28 ENCOUNTER — Other Ambulatory Visit: Payer: Self-pay | Admitting: Cardiology

## 2013-04-28 ENCOUNTER — Other Ambulatory Visit: Payer: Self-pay | Admitting: *Deleted

## 2013-04-28 MED ORDER — LISINOPRIL 10 MG PO TABS: 10.0000 mg | ORAL_TABLET | Freq: Every day | ORAL | Status: DC

## 2013-04-28 MED ORDER — FUROSEMIDE 40 MG PO TABS: 20.0000 mg | ORAL_TABLET | Freq: Two times a day (BID) | ORAL | Status: DC

## 2013-04-28 MED ORDER — DIGOXIN 125 MCG PO TABS: 125.0000 ug | ORAL_TABLET | Freq: Every day | ORAL | Status: DC

## 2013-04-28 MED ORDER — SIMVASTATIN 40 MG PO TABS: 40.0000 mg | ORAL_TABLET | Freq: Every day | ORAL | Status: DC

## 2013-04-28 MED ORDER — CARVEDILOL 25 MG PO TABS: 25.0000 mg | ORAL_TABLET | Freq: Two times a day (BID) | ORAL | Status: DC

## 2013-05-17 ENCOUNTER — Encounter: Payer: Self-pay | Admitting: Internal Medicine

## 2013-05-17 ENCOUNTER — Ambulatory Visit (INDEPENDENT_AMBULATORY_CARE_PROVIDER_SITE_OTHER): Payer: Medicare Other | Admitting: Internal Medicine

## 2013-05-17 VITALS — BP 114/68 | HR 72 | Ht 72.0 in | Wt 212.4 lb

## 2013-05-17 DIAGNOSIS — I420 Dilated cardiomyopathy: Secondary | ICD-10-CM

## 2013-05-17 DIAGNOSIS — I4891 Unspecified atrial fibrillation: Secondary | ICD-10-CM

## 2013-05-17 DIAGNOSIS — I5022 Chronic systolic (congestive) heart failure: Secondary | ICD-10-CM

## 2013-05-17 DIAGNOSIS — I428 Other cardiomyopathies: Secondary | ICD-10-CM

## 2013-05-17 DIAGNOSIS — Z95810 Presence of automatic (implantable) cardiac defibrillator: Secondary | ICD-10-CM

## 2013-05-17 LAB — ICD DEVICE OBSERVATION
ATRIAL PACING ICD: 13 pct
BAMS-0001: 150 {beats}/min
BAMS-0003: 70 {beats}/min
CHARGE TIME: 14.4 s
FVT: 0
HV IMPEDENCE: 49 Ohm
LV LEAD IMPEDENCE ICD: 487.5 Ohm
LV LEAD THRESHOLD: 1.5 V
RV LEAD IMPEDENCE ICD: 700 Ohm
TOT-0007: 1
TOT-0008: 0
TOT-0009: 1
TZAT-0004SLOWVT: 8
TZAT-0012SLOWVT: 200 ms
TZAT-0013SLOWVT: 3
TZAT-0018SLOWVT: NEGATIVE
TZAT-0019SLOWVT: 7.5 V
TZON-0003SLOWVT: 300 ms
TZON-0004SLOWVT: 30
TZON-0010SLOWVT: 60 ms
TZST-0001SLOWVT: 3
TZST-0001SLOWVT: 5
TZST-0003SLOWVT: 36 J
VENTRICULAR PACING ICD: 99.84 pct

## 2013-05-17 NOTE — Patient Instructions (Signed)
   Continue Merlin remote checks - next 08/20/2013 Your physician wants you to follow up in: 6 months.  You will receive a reminder letter in the mail one-two months in advance.  If you don't receive a letter, please call our office to schedule the follow up appointment - Belenda Cruise Your physician wants you to follow up in:  1 year.  You will receive a reminder letter in the mail one-two months in advance.  If you don't receive a letter, please call our office to schedule the follow up appointment - Dr. Johney Frame

## 2013-05-17 NOTE — Progress Notes (Signed)
PCP:  [name], MD  The patient presents today for routine electrophysiology followup.  He feels that he is doing very well presently.  He continues to have difficulty with diabetes management, largely due to compliance.  Today, he denies symptoms of palpitations, chest pain, shortness of breath, orthopnea, PND, lower extremity edema, dizziness, presyncope, syncope, or neurologic sequela.  The patient feels that he is tolerating medications without difficulties and is otherwise without complaint today.   Past Medical History  Diagnosis Date  . CAD (coronary artery disease)     Minimal nonobstructive CAD at catheterization 2009  . Chronic systolic heart failure   . LBBB (left bundle branch block)   . Secondary cardiomyopathy     Nonischemic  . Type 2 diabetes mellitus   . Atrial fibrillation     Declines anticoagulation  . Myocardial infarction   . Automatic implantable cardioverter-defibrillator in situ   . CHF (congestive heart failure)    Past Surgical History  Procedure Laterality Date  . Hernia repair    . Fiscula repair    . Hearing surgery    . Cholecystectomy    . Cardiac defibrillator placement  12/14/2007    St. Jude Promote RF 3207 ICD placed by Dr. Graciela Husbands for CHF, nonischemic cardiomyopathy    Current Outpatient Prescriptions  Medication Sig Dispense Refill  . aspirin 325 MG EC tablet Take 325 mg by mouth every morning. Takes after breakfast      . aspirin 81 MG tablet Take 81 mg by mouth at bedtime.       . carvedilol (COREG) 25 MG tablet Take 1 tablet (25 mg total) by mouth 2 (two) times daily with a meal.  180 tablet  1  . Cinnamon 500 MG TABS Take 1 tablet by mouth daily.       . digoxin (LANOXIN) 0.125 MG tablet Take 1 tablet (125 mcg total) by mouth daily.  90 tablet  1  . Echinacea 400 MG CAPS Take 1 capsule by mouth every morning.      . furosemide (LASIX) 40 MG tablet Take 0.5 tablets (20 mg total) by mouth 2 (two) times daily.  90 tablet  1  . glipiZIDE  (GLUCOTROL XL) 10 MG 24 hr tablet Take 10 mg by mouth 2 (two) times daily.      . simvastatin (ZOCOR) 40 MG tablet Take 1 tablet (40 mg total) by mouth at bedtime.  90 tablet  1  . vitamin B-12 (CYANOCOBALAMIN) 500 MCG tablet Take 1 tablet by mouth daily.       Marland Kitchen lisinopril (PRINIVIL,ZESTRIL) 10 MG tablet Take 1 tablet (10 mg total) by mouth daily.  90 tablet  1   No current facility-administered medications for this visit.    Allergies  Allergen Reactions  . Penicillins     History   Social History  . Marital Status: Married    Spouse Name: N/A    Number of Children: N/A  . Years of Education: N/A   Occupational History  . Retired    Social History Main Topics  . Smoking status: Former Smoker -- 0.30 packs/day for 5 years    Types: Cigarettes    Quit date: 08/05/1965  . Smokeless tobacco: Never Used  . Alcohol Use: No  . Drug Use: No  . Sexual Activity: Yes    Birth Control/ Protection: None   Other Topics Concern  . Not on file   Social History Narrative  . No narrative on file  Family History  Problem Relation Age of Onset  . Cancer    . Coronary artery disease    . Diabetes        Physical Exam: Filed Vitals:   05/17/13 1104  BP: 114/68  Pulse: 72  Height: 6' (1.829 m)  Weight: 212 lb 6.4 oz (96.344 kg)    GEN- The patient is well appearing, alert and oriented x 3 today.   Head- normocephalic, atraumatic Eyes-  Sclera clear, conjunctiva pink Ears- hearing intact Oropharynx- clear Neck- supple, no JVP Lymph- no cervical lymphadenopathy Lungs- Clear to ausculation bilaterally, normal work of breathing Chest- ICD pocket is well healed Heart- Regular rate and rhythm, no murmurs, rubs or gallops, PMI not laterally displaced GI- soft, NT, ND, + BS Extremities- no clubbing, cyanosis, or edema Neuro- strength and sensation are intact  ICD interrogation- reviewed in detail today,  See PACEART report  Assessment and Plan:  1. Chronic systolic  dysfunction Normal BiV ICD function See Pace Art report No changes today Will send him a cellular adaptor and merlin compliance is encouraged He has estimated battery longevity of 7 months Return to see Belenda Cruise in 6 months Vibratory alert demonstrated for him today  2. afib No recent afib by device interrogation Today, I discussed coumadin and novel anticoagulants including xarelto, and eliquis today as indicated for risk reduction in stroke and systemic emboli with nonvalvular atrial fibrillation.  Risks, benefits, and alternatives to each of these drugs were discussed at length today.  He remains very clear in his decision to decline anticoagulation.  Given CHADS2VASC score of at least 3, his risks for stroke warrant anticoagualtion.  I have informed him of this today.  Merlin Return to see Belenda Cruise in 6 months I will see in 1 year

## 2013-05-31 ENCOUNTER — Encounter (HOSPITAL_COMMUNITY): Payer: Self-pay | Admitting: Pharmacy Technician

## 2013-06-07 NOTE — Patient Instructions (Signed)
CRISANTO NIED  06/07/2013   Your procedure is scheduled on:  06/14/13  Report to [facility] at [place] AM.  Call this number if you have problems the morning of surgery: [phone]   Remember:   Do not eat food or drink liquids after midnight.   Take these medicines the morning of surgery with A SIP OF WATER: coreg, digoxin, lisinopril   Do not wear jewelry, make-up or nail polish.  Do not wear lotions, powders, or perfumes. You may wear deodorant.  Do not shave 48 hours prior to surgery. Men may shave face and neck.  Do not bring valuables to the hospital.  [facility] is not responsible                  for any belongings or valuables.               Contacts, dentures or bridgework may not be worn into surgery.  Leave suitcase in the car. After surgery it may be brought to your room.  For patients admitted to the hospital, discharge time is determined by your                treatment team.               Patients discharged the day of surgery will not be allowed to drive  home.  Name and phone number of your driver: family Special Instructions: N/A   Please read over the following fact sheets that you were given: Anesthesia Post-op Instructions and Care and Recovery After Surgery   PATIENT INSTRUCTIONS POST-ANESTHESIA  IMMEDIATELY FOLLOWING SURGERY:  Do not drive or operate machinery for the first twenty four hours after surgery.  Do not make any important decisions for twenty four hours after surgery or while taking narcotic pain medications or sedatives.  If you develop intractable nausea and vomiting or a severe headache please notify your doctor immediately.  FOLLOW-UP:  Please make an appointment with your surgeon as instructed. You do not need to follow up with anesthesia unless specifically instructed to do so.  WOUND CARE INSTRUCTIONS (if applicable):  Keep a dry clean dressing on the anesthesia/puncture wound site if there is drainage.  Once the wound has quit draining  you may leave it open to air.  Generally you should leave the bandage intact for twenty four hours unless there is drainage.  If the epidural site drains for more than 36-48 hours please call the anesthesia department.  QUESTIONS?:  Please feel free to call your physician or the hospital operator if you have any questions, and they will be happy to assist you.      Cataract Surgery  A cataract is a clouding of the lens of the eye. When a lens becomes cloudy, vision is reduced based on the degree and nature of the clouding. Surgery may be needed to improve vision. Surgery removes the cloudy lens and usually replaces it with a substitute lens (intraocular lens, IOL). LET YOUR EYE DOCTOR KNOW ABOUT:  Allergies to food or medicine.  Medicines taken including herbs, eyedrops, over-the-counter medicines, and creams.  Use of steroids (by mouth or creams).  Previous problems with anesthetics or numbing medicine.  History of bleeding problems or blood clots.  Previous surgery.  Other health problems, including diabetes and kidney problems.  Possibility of pregnancy, if this applies. RISKS AND COMPLICATIONS  Infection.  Inflammation of the eyeball (endophthalmitis) that can spread to both eyes (sympathetic ophthalmia).  Poor wound  healing.  If an IOL is inserted, it can later fall out of proper position. This is very uncommon.  Clouding of the part of your eye that holds an IOL in place. This is called an "after-cataract." These are uncommon, but easily treated. BEFORE THE PROCEDURE  Do not eat or drink anything except small amounts of water for 8 to 12 before your surgery, or as directed by your caregiver.  Unless you are told otherwise, continue any eyedrops you have been prescribed.  Talk to your primary caregiver about all other medicines that you take (both prescription and non-prescription). In some cases, you may need to stop or change medicines near the time of your surgery.  This is most important if you are taking blood-thinning medicine.Do not stop medicines unless you are told to do so.  Arrange for someone to drive you to and from the procedure.  Do not put contact lenses in either eye on the day of your surgery. PROCEDURE There is more than one method for safely removing a cataract. Your doctor can explain the differences and help determine which is best for you. Phacoemulsification surgery is the most common form of cataract surgery.  An injection is given behind the eye or eyedrops are given to make this a painless procedure.  A small cut (incision) is made on the edge of the clear, dome-shaped surface that covers the front of the eye (cornea).  A tiny probe is painlessly inserted into the eye. This device gives off ultrasound waves that soften and break up the cloudy center of the lens. This makes it easier for the cloudy lens to be removed by suction.  An IOL may be implanted.  The normal lens of the eye is covered by a clear capsule. Part of that capsule is intentionally left in the eye to support the IOL.  Your surgeon may or may not use stitches to close the incision. There are other forms of cataract surgery that require a larger incision and stiches to close the eye. This approach is taken in cases where the doctor feels that the cataract cannot be easily removed using phacoemulsification. AFTER THE PROCEDURE  When an IOL is implanted, it does not need care. It becomes a permanent part of your eye and cannot be seen or felt.  Your doctor will schedule follow-up exams to check on your progress.  Review your other medicines with your doctor to see which can be resumed after surgery.  Use eyedrops or take medicine as prescribed by your doctor. Document Released: 07/11/2011 Document Revised: 10/14/2011 Document Reviewed: 07/11/2011 [facility] Patient Information 2014 [place], [place].

## 2013-06-08 ENCOUNTER — Encounter (HOSPITAL_COMMUNITY)
Admission: RE | Admit: 2013-06-08 | Discharge: 2013-06-08 | Disposition: A | Discharge status: Home / Self Care | Payer: Medicare Other | Source: Ambulatory Visit | Attending: Ophthalmology | Admitting: Ophthalmology

## 2013-06-08 ENCOUNTER — Encounter (HOSPITAL_COMMUNITY): Payer: Self-pay

## 2013-06-08 ENCOUNTER — Other Ambulatory Visit: Payer: Self-pay

## 2013-06-08 DIAGNOSIS — Z01812 Encounter for preprocedural laboratory examination: Secondary | ICD-10-CM | POA: Insufficient documentation

## 2013-06-08 DIAGNOSIS — Z01810 Encounter for preprocedural cardiovascular examination: Secondary | ICD-10-CM | POA: Insufficient documentation

## 2013-06-08 DIAGNOSIS — Z01818 Encounter for other preprocedural examination: Secondary | ICD-10-CM | POA: Insufficient documentation

## 2013-06-08 HISTORY — DX: Hyperlipidemia, unspecified: E78.5

## 2013-06-08 LAB — BASIC METABOLIC PANEL
BUN: 19 mg/dL (ref 6–23)
CO2: 28 mEq/L (ref 19–32)
Calcium: 9.7 mg/dL (ref 8.4–10.5)
Chloride: 100 mEq/L (ref 96–112)
Creatinine, Ser: 0.88 mg/dL (ref 0.50–1.35)
GFR calc Af Amer: >90 mL/min (ref >90)

## 2013-06-08 LAB — HEMOGLOBIN AND HEMATOCRIT, BLOOD: Hemoglobin: 15.4 g/dL (ref 13.0–17.0)

## 2013-06-10 ENCOUNTER — Ambulatory Visit (INDEPENDENT_AMBULATORY_CARE_PROVIDER_SITE_OTHER): Payer: Medicare Other | Admitting: Cardiology

## 2013-06-10 ENCOUNTER — Encounter: Payer: Self-pay | Admitting: Cardiology

## 2013-06-10 VITALS — BP 115/74 | HR 72 | Ht 72.0 in | Wt 212.8 lb

## 2013-06-10 DIAGNOSIS — I2510 Atherosclerotic heart disease of native coronary artery without angina pectoris: Secondary | ICD-10-CM

## 2013-06-10 DIAGNOSIS — I428 Other cardiomyopathies: Secondary | ICD-10-CM

## 2013-06-10 DIAGNOSIS — I4891 Unspecified atrial fibrillation: Secondary | ICD-10-CM

## 2013-06-10 DIAGNOSIS — I420 Dilated cardiomyopathy: Secondary | ICD-10-CM

## 2013-06-10 NOTE — Progress Notes (Signed)
Clinical Summary [name] is a 67 y.o.male last seen in November 2013. Interval followup recently with Dr. Johney Schwartz noted in October of this year. He reports no angina, no palpitations or device discharges. He is describing NYHA class II dyspnea. He tells me that he has been taking his medications regularly, also working on his diet to control diabetes more effectively. He states that he sees Dr. Neita Schwartz regularly.  He continues to decline anticoagulation for stroke risk reduction in atrial fibrillation. Last assessment of LVEF was in October 2012 by Dr. Andee Schwartz, reportedly in the range of 40-50%. He has declined followup study since that time. Weight is stable however over the last 6 months.   Allergies  Allergen Reactions  . Penicillins Hives and Rash    Current Outpatient Prescriptions  Medication Sig Dispense Refill  . aspirin 325 MG EC tablet Take 325 mg by mouth every morning. Takes after breakfast      . aspirin 81 MG tablet Take 81 mg by mouth at bedtime.       . carvedilol (COREG) 25 MG tablet Take 1 tablet (25 mg total) by mouth 2 (two) times daily with a meal.  180 tablet  1  . Cinnamon 500 MG TABS Take 1 tablet by mouth daily.       . digoxin (LANOXIN) 0.125 MG tablet Take 1 tablet (125 mcg total) by mouth daily.  90 tablet  1  . Echinacea 400 MG CAPS Take 1 capsule by mouth every morning.      . furosemide (LASIX) 40 MG tablet Take 0.5 tablets (20 mg total) by mouth 2 (two) times daily.  90 tablet  1  . glipiZIDE (GLUCOTROL XL) 10 MG 24 hr tablet Take 10 mg by mouth 2 (two) times daily.      Marland Kitchen lisinopril (PRINIVIL,ZESTRIL) 10 MG tablet Take 1 tablet (10 mg total) by mouth daily.  90 tablet  1  . simvastatin (ZOCOR) 40 MG tablet Take 1 tablet (40 mg total) by mouth at bedtime.  90 tablet  1  . vitamin B-12 (CYANOCOBALAMIN) 500 MCG tablet Take 1 tablet by mouth daily.       Marland Kitchen ketorolac (ACULAR) 0.4 % SOLN       . ofloxacin (OCUFLOX) 0.3 % ophthalmic solution       .  prednisoLONE acetate (PRED FORTE) 1 % ophthalmic suspension        No current facility-administered medications for this visit.    Past Medical History  Diagnosis Date  . CAD (coronary artery disease)     Minimal nonobstructive CAD at catheterization 2009  . Chronic systolic heart failure   . LBBB (left bundle branch block)   . Secondary cardiomyopathy     Nonischemic  . Type 2 diabetes mellitus   . Atrial fibrillation     Declines anticoagulation  . Myocardial infarction   . CHF (congestive heart failure)   . Automatic implantable cardioverter-defibrillator in situ     Dr. Hillery Schwartz  . Hypertension   . Hyperlipidemia     Past Surgical History  Procedure Laterality Date  . Hernia repair    . Fiscula repair    . Hearing surgery    . Cholecystectomy    . Cardiac defibrillator placement  12/14/2007    [employer] Promote RF 3207 ICD placed by [name] for CHF, nonischemic cardiomyopathy    Social History [name] reports that he quit smoking about 47 years ago. His smoking use included Cigarettes. He  has a 1.5 pack-year smoking history. He has never used smokeless tobacco. [name] reports that he does not drink alcohol.  Review of Systems No claudication, no significant leg edema, no orthopnea or PND. Otherwise negative.  Physical Examination Filed Vitals:   06/10/13 1318  BP: 115/74  Pulse: 72   Filed Weights   06/10/13 1318  Weight: 212 lb 12.8 oz (96.525 kg)    Patient in no acute distress.  HEENT: Conjunctiva and lids normal, oropharynx clear.  Neck: Supple, no elevated JVP or carotid bruits, no thyromegaly.  Lungs: Clear to auscultation, nonlabored breathing at rest.  Cardiac: Regular rate and rhythm, no S3 or significant systolic murmur, no pericardial rub.  Thorax: Stable device pocket site.  Abdomen: Soft, nontender, bowel sounds present.  Extremities: No pitting edema, distal pulses 1-2+.  Skin: Warm and dry.  Musculoskeletal: No kyphosis.    Neuropsychiatric: Alert and oriented x3, affect grossly appropriate.   Problem List and Plan   CAD, NATIVE VESSEL Minimal nonobstructive CAD at catheterization 2009. No active angina symptoms.  Nonischemic dilated cardiomyopathy LVEF 40-50% by echocardiogram in October 2012 per Dr. Margarita Schwartz notes. Patient has declined followup study. He is status post CRT-D.  ATRIAL FIBRILLATION, PAROXYSMAL History of PAF, declines anticoagulation. This has been discussed many times.    Jonelle Schwartz, M.D., F.A.C.C.

## 2013-06-10 NOTE — Patient Instructions (Signed)
Continue all current medications. Your physician wants you to follow up in:  1 year.  You will receive a reminder letter in the mail one-two months in advance.  If you don't receive a letter, please call our office to schedule the follow up appointment   

## 2013-06-10 NOTE — Assessment & Plan Note (Signed)
LVEF 40-50% by echocardiogram in October 2012 per Dr. Margarita Mail notes. Patient has declined followup study. He is status post CRT-D.

## 2013-06-10 NOTE — Assessment & Plan Note (Signed)
Minimal nonobstructive CAD at catheterization 2009. No active angina symptoms.

## 2013-06-10 NOTE — Assessment & Plan Note (Signed)
History of PAF, declines anticoagulation. This has been discussed many times.

## 2013-06-11 MED ORDER — TETRACAINE HCL 0.5 % OP SOLN
OPHTHALMIC | Status: AC
  Filled 2013-06-11: qty 2

## 2013-06-11 MED ORDER — LIDOCAINE HCL 3.5 % OP GEL
OPHTHALMIC | Status: AC
  Filled 2013-06-11: qty 1

## 2013-06-11 MED ORDER — PHENYLEPHRINE HCL 2.5 % OP SOLN
OPHTHALMIC | Status: AC
  Filled 2013-06-11: qty 15

## 2013-06-11 MED ORDER — CYCLOPENTOLATE-PHENYLEPHRINE OP SOLN OPTIME - NO CHARGE
OPHTHALMIC | Status: AC
  Filled 2013-06-11: qty 2

## 2013-06-11 MED ORDER — LIDOCAINE HCL (PF) 1 % IJ SOLN
INTRAMUSCULAR | Status: AC
  Filled 2013-06-11: qty 2

## 2013-06-11 MED ORDER — NEOMYCIN-POLYMYXIN-DEXAMETH 3.5-10000-0.1 OP SUSP
OPHTHALMIC | Status: AC
  Filled 2013-06-11: qty 5

## 2013-06-14 ENCOUNTER — Ambulatory Visit (HOSPITAL_COMMUNITY): Payer: Medicare Other | Admitting: Anesthesiology

## 2013-06-14 ENCOUNTER — Encounter (HOSPITAL_COMMUNITY)
Admission: RE | Disposition: A | Discharge status: Home / Self Care | Payer: Self-pay | Source: Ambulatory Visit | Attending: Ophthalmology

## 2013-06-14 ENCOUNTER — Ambulatory Visit (HOSPITAL_COMMUNITY)
Admission: RE | Admit: 2013-06-14 | Discharge: 2013-06-14 | Disposition: A | Discharge status: Home / Self Care | Payer: Medicare Other | Source: Ambulatory Visit | Attending: Ophthalmology | Admitting: Ophthalmology

## 2013-06-14 ENCOUNTER — Encounter (HOSPITAL_COMMUNITY): Payer: Self-pay | Admitting: *Deleted

## 2013-06-14 ENCOUNTER — Encounter (HOSPITAL_COMMUNITY): Payer: Medicare Other | Admitting: Anesthesiology

## 2013-06-14 DIAGNOSIS — I10 Essential (primary) hypertension: Secondary | ICD-10-CM | POA: Insufficient documentation

## 2013-06-14 DIAGNOSIS — E119 Type 2 diabetes mellitus without complications: Secondary | ICD-10-CM | POA: Insufficient documentation

## 2013-06-14 DIAGNOSIS — H2510 Age-related nuclear cataract, unspecified eye: Secondary | ICD-10-CM | POA: Insufficient documentation

## 2013-06-14 DIAGNOSIS — Z01812 Encounter for preprocedural laboratory examination: Secondary | ICD-10-CM | POA: Insufficient documentation

## 2013-06-14 HISTORY — PX: CATARACT EXTRACTION W/PHACO: SHX586

## 2013-06-14 LAB — GLUCOSE, CAPILLARY: Glucose-Capillary: 195 mg/dL — ABNORMAL HIGH (ref 70–99)

## 2013-06-14 SURGERY — PHACOEMULSIFICATION, CATARACT, WITH IOL INSERTION
Anesthesia: Monitor Anesthesia Care | Site: Eye | Laterality: Right | Wound class: Clean

## 2013-06-14 MED ORDER — TETRACAINE HCL 0.5 % OP SOLN
1.0000 [drp] | OPHTHALMIC | Status: AC
  Administered 2013-06-14 (×3): 1 [drp] via OPHTHALMIC

## 2013-06-14 MED ORDER — MIDAZOLAM HCL 2 MG/2ML IJ SOLN
1.0000 mg | INTRAMUSCULAR | Status: DC | PRN
  Administered 2013-06-14: 2 mg via INTRAVENOUS
  Filled 2013-06-14: qty 2

## 2013-06-14 MED ORDER — EPINEPHRINE HCL 1 MG/ML IJ SOLN
INTRAMUSCULAR | Status: AC
  Filled 2013-06-14: qty 1

## 2013-06-14 MED ORDER — EPINEPHRINE HCL 1 MG/ML IJ SOLN
INTRAOCULAR | Status: DC | PRN
  Administered 2013-06-14: 08:00:00

## 2013-06-14 MED ORDER — FENTANYL CITRATE 0.05 MG/ML IJ SOLN
25.0000 ug | INTRAMUSCULAR | Status: AC
  Administered 2013-06-14: 25 ug via INTRAVENOUS
  Filled 2013-06-14: qty 2

## 2013-06-14 MED ORDER — PHENYLEPHRINE HCL 2.5 % OP SOLN
1.0000 [drp] | OPHTHALMIC | Status: AC
  Administered 2013-06-14 (×3): 1 [drp] via OPHTHALMIC

## 2013-06-14 MED ORDER — NA HYALUR & NA CHOND-NA HYALUR 0.55-0.5 ML IO KIT
PACK | INTRAOCULAR | Status: DC | PRN
  Administered 2013-06-14: 1 via OPHTHALMIC

## 2013-06-14 MED ORDER — PROVISC 10 MG/ML IO SOLN: INTRAOCULAR | Status: DC | PRN

## 2013-06-14 MED ORDER — LIDOCAINE HCL (PF) 1 % IJ SOLN
INTRAOCULAR | Status: DC | PRN
  Administered 2013-06-14: 08:00:00 via OPHTHALMIC

## 2013-06-14 MED ORDER — BSS IO SOLN
INTRAOCULAR | Status: DC | PRN
  Administered 2013-06-14: 15 mL via INTRAOCULAR

## 2013-06-14 MED ORDER — NEOMYCIN-POLYMYXIN-DEXAMETH 3.5-10000-0.1 OP SUSP
OPHTHALMIC | Status: DC | PRN
  Administered 2013-06-14: 2 [drp] via OPHTHALMIC

## 2013-06-14 MED ORDER — CYCLOPENTOLATE-PHENYLEPHRINE 0.2-1 % OP SOLN
1.0000 [drp] | OPHTHALMIC | Status: AC
  Administered 2013-06-14 (×3): 1 [drp] via OPHTHALMIC

## 2013-06-14 MED ORDER — POVIDONE-IODINE 5 % OP SOLN
OPHTHALMIC | Status: DC | PRN
  Administered 2013-06-14: 1 via OPHTHALMIC

## 2013-06-14 MED ORDER — LACTATED RINGERS IV SOLN
INTRAVENOUS | Status: DC
  Administered 2013-06-14: 07:00:00 via INTRAVENOUS

## 2013-06-14 MED ORDER — LIDOCAINE HCL 3.5 % OP GEL: 1.0000 "application " | Freq: Once | OPHTHALMIC | Status: DC

## 2013-06-14 MED ORDER — LIDOCAINE 3.5 % OP GEL OPTIME - NO CHARGE
OPHTHALMIC | Status: DC | PRN
  Administered 2013-06-14: 2 [drp] via OPHTHALMIC

## 2013-06-14 SURGICAL SUPPLY — 33 items
CAPSULAR TENSION RING-AMO (OPHTHALMIC RELATED) IMPLANT
CLOTH BEACON ORANGE TIMEOUT ST (SAFETY) ×2 IMPLANT
DRAPE X-RAY CASS 24X20 (DRAPES) ×2 IMPLANT
EYE SHIELD UNIVERSAL CLEAR (GAUZE/BANDAGES/DRESSINGS) ×2 IMPLANT
GLOVE BIO SURGEON STRL SZ 6.5 (GLOVE) IMPLANT
GLOVE BIOGEL PI IND STRL 6.5 (GLOVE) IMPLANT
GLOVE BIOGEL PI IND STRL 7.0 (GLOVE) ×2 IMPLANT
GLOVE BIOGEL PI IND STRL 7.5 (GLOVE) IMPLANT
GLOVE BIOGEL PI INDICATOR 6.5 (GLOVE)
GLOVE BIOGEL PI INDICATOR 7.0 (GLOVE) ×2
GLOVE BIOGEL PI INDICATOR 7.5 (GLOVE)
GLOVE ECLIPSE 6.5 STRL STRAW (GLOVE) IMPLANT
GLOVE ECLIPSE 7.0 STRL STRAW (GLOVE) IMPLANT
GLOVE ECLIPSE 7.5 STRL STRAW (GLOVE) IMPLANT
GLOVE EXAM NITRILE LRG STRL (GLOVE) IMPLANT
GLOVE EXAM NITRILE MD LF STRL (GLOVE) IMPLANT
GLOVE SKINSENSE NS SZ6.5 (GLOVE)
GLOVE SKINSENSE NS SZ7.0 (GLOVE)
GLOVE SKINSENSE STRL SZ6.5 (GLOVE) IMPLANT
GLOVE SKINSENSE STRL SZ7.0 (GLOVE) IMPLANT
KIT VITRECTOMY (OPHTHALMIC RELATED) IMPLANT
PAD ARMBOARD 7.5X6 YLW CONV (MISCELLANEOUS) ×2 IMPLANT
PROC W NO LENS (INTRAOCULAR LENS)
PROC W SPEC LENS (INTRAOCULAR LENS)
PROCESS W NO LENS (INTRAOCULAR LENS) IMPLANT
PROCESS W SPEC LENS (INTRAOCULAR LENS) IMPLANT
RING MALYGIN (MISCELLANEOUS) IMPLANT
SIGHTPATH CAT PROC W REG LENS (Ophthalmic Related) ×2 IMPLANT
SYR TB 1ML LL NO SAFETY (SYRINGE) ×2 IMPLANT
TAPE SURG TRANSPORE 1 IN (GAUZE/BANDAGES/DRESSINGS) ×1 IMPLANT
TAPE SURGICAL TRANSPORE 1 IN (GAUZE/BANDAGES/DRESSINGS) ×1
VISCOELASTIC ADDITIONAL (OPHTHALMIC RELATED) IMPLANT
WATER STERILE IRR 250ML POUR (IV SOLUTION) ×2 IMPLANT

## 2013-06-14 NOTE — Transfer of Care (Signed)
Immediate Anesthesia Transfer of Care Note  Patient: [name]  Procedure(s) Performed: Procedure(s) (LRB): CATARACT EXTRACTION PHACO AND INTRAOCULAR LENS PLACEMENT (IOC) (Right)  Patient Location: Shortstay  Anesthesia Type: MAC  Level of Consciousness: awake  Airway & Oxygen Therapy: Patient Spontanous Breathing   Post-op Assessment: Report given to PACU RN, Post -op Vital signs reviewed and stable and Patient moving all extremities  Post vital signs: Reviewed and stable  Complications: No apparent anesthesia complications

## 2013-06-14 NOTE — Anesthesia Postprocedure Evaluation (Signed)
  Anesthesia Post-op Note  Patient: [name]  Procedure(s) Performed: Procedure(s) (LRB): CATARACT EXTRACTION PHACO AND INTRAOCULAR LENS PLACEMENT (IOC) (Right)  Patient Location:  Short Stay  Anesthesia Type: MAC  Level of Consciousness: awake  Airway and Oxygen Therapy: Patient Spontanous Breathing  Post-op Pain: none  Post-op Assessment: Post-op Vital signs reviewed, Patient's Cardiovascular Status Stable, Respiratory Function Stable, Patent Airway, No signs of Nausea or vomiting and Pain level controlled  Post-op Vital Signs: Reviewed and stable  Complications: No apparent anesthesia complications

## 2013-06-14 NOTE — Addendum Note (Signed)
Addendum created 06/14/13 0800 by Franco Nones, CRNA   Modules edited: Anesthesia Blocks and Procedures

## 2013-06-14 NOTE — Anesthesia Preprocedure Evaluation (Signed)
Anesthesia Evaluation   Patient awake    Reviewed: Allergy & Precautions, H&P , NPO status , Patient's Chart, lab work & pertinent test results, reviewed documented beta blocker date and time   Airway Mallampati: II TM Distance: >3 FB     Dental  (+) Teeth Intact and Chipped   Pulmonary former smoker,  breath sounds clear to auscultation        Cardiovascular hypertension, + CAD, + Past MI and +CHF + dysrhythmias Atrial Fibrillation + Cardiac Defibrillator Rhythm:Irregular Rate:Normal     Neuro/Psych    GI/Hepatic negative GI ROS,   Endo/Other  diabetes, Type 2, Oral Hypoglycemic Agents  Renal/GU      Musculoskeletal   Abdominal   Peds  Hematology   Anesthesia Other Findings   Reproductive/Obstetrics                           Anesthesia Physical Anesthesia Plan  ASA: III  Anesthesia Plan: MAC   Post-op Pain Management:    Induction: Intravenous  Airway Management Planned: Nasal Cannula  Additional Equipment:   Intra-op Plan:   Post-operative Plan:   Informed Consent: I have reviewed the patients History and Physical, chart, labs and discussed the procedure including the risks, benefits and alternatives for the proposed anesthesia with the patient or authorized representative who has indicated his/her understanding and acceptance.     Plan Discussed with:   Anesthesia Plan Comments:         Anesthesia Quick Evaluation  

## 2013-06-14 NOTE — Anesthesia Procedure Notes (Signed)
Procedure Name: MAC Date/Time: 06/14/2013 7:27 AM Performed by: Franco Nones Pre-anesthesia Checklist: Patient identified, Emergency Drugs available, Suction available, Timeout performed and Patient being monitored Patient Re-evaluated:Patient Re-evaluated prior to inductionOxygen Delivery Method: Nasal Cannula

## 2013-06-14 NOTE — Op Note (Signed)
Date of Admission: 06/14/2013  Date of Surgery: 06/14/2013  Pre-Op Dx: Cataract  Right  Eye  Post-Op Dx: Nuclear Cataract  Right  Eye,  Dx Code 366.16  Surgeon: Gemma Payor, M.D.  Assistants: None  Anesthesia: Topical with MAC  Indications: Painless, progressive loss of vision with compromise of daily activities.  Surgery: Cataract Extraction with Intraocular lens Implant Right Eye  Discription: The patient had dilating drops and viscous lidocaine placed into the right eye in the pre-op holding area. After transfer to the operating room, a time out was performed. The patient was then prepped and draped. Beginning with a 75 degree blade a paracentesis port was made at the surgeon's 2 o'clock position. The anterior chamber was then filled with 1% non-preserved lidocaine. This was followed by filling the anterior chamber with Provisc.  A 2.72mm keratome blade was used to make a clear corneal incision at the temporal limbus.  A bent cystatome needle was used to create a continuous tear capsulotomy. Hydrodissection was performed with balanced salt solution on a Fine canula. The lens nucleus was then removed using the phacoemulsification handpiece. Residual cortex was removed with the I&A handpiece. The anterior chamber and capsular bag were refilled with Provisc. A posterior chamber intraocular lens was placed into the capsular bag with it's injector. The implant was positioned with the Kuglan hook. The Provisc was then removed from the anterior chamber and capsular bag with the I&A handpiece. Stromal hydration of the main incision and paracentesis port was performed with BSS on a Fine canula. The wounds were tested for leak which was negative. The patient tolerated the procedure well. There were no operative complications. The patient was then transferred to the recovery room in stable condition.  Complications: None  Specimen: None  EBL: None  Prosthetic device: B&L enVista, MX60, power 20.5D,  SN [id number].

## 2013-06-14 NOTE — H&P (Signed)
I have reviewed the H&P, the patient was re-examined, and I have identified no interval changes in medical condition and plan of care since the history and physical of record  

## 2013-06-15 ENCOUNTER — Encounter (HOSPITAL_COMMUNITY): Payer: Self-pay | Admitting: Ophthalmology

## 2013-06-23 ENCOUNTER — Encounter (HOSPITAL_COMMUNITY): Payer: Self-pay | Admitting: Pharmacy Technician

## 2013-06-28 ENCOUNTER — Encounter (HOSPITAL_COMMUNITY): Payer: Self-pay

## 2013-06-28 MED ORDER — ONDANSETRON HCL 4 MG/2ML IJ SOLN: 4.0000 mg | Freq: Once | INTRAMUSCULAR | Status: AC | PRN

## 2013-06-28 MED ORDER — FENTANYL CITRATE 0.05 MG/ML IJ SOLN: 25.0000 ug | INTRAMUSCULAR | Status: DC | PRN

## 2013-06-30 ENCOUNTER — Inpatient Hospital Stay (HOSPITAL_COMMUNITY)
Admission: RE | Admit: 2013-06-30 | Discharge: 2013-06-30 | Disposition: A | Discharge status: Home / Self Care | Payer: Medicare Other | Source: Ambulatory Visit

## 2013-07-07 MED ORDER — LIDOCAINE HCL 3.5 % OP GEL
OPHTHALMIC | Status: AC
  Filled 2013-07-07: qty 1

## 2013-07-07 MED ORDER — NEOMYCIN-POLYMYXIN-DEXAMETH 3.5-10000-0.1 OP SUSP
OPHTHALMIC | Status: AC
  Filled 2013-07-07: qty 5

## 2013-07-07 MED ORDER — CYCLOPENTOLATE-PHENYLEPHRINE OP SOLN OPTIME - NO CHARGE
OPHTHALMIC | Status: AC
  Filled 2013-07-07: qty 2

## 2013-07-07 MED ORDER — TETRACAINE HCL 0.5 % OP SOLN
OPHTHALMIC | Status: AC
  Filled 2013-07-07: qty 2

## 2013-07-07 MED ORDER — LIDOCAINE HCL (PF) 1 % IJ SOLN
INTRAMUSCULAR | Status: AC
  Filled 2013-07-07: qty 2

## 2013-07-08 ENCOUNTER — Ambulatory Visit (HOSPITAL_COMMUNITY)
Admission: RE | Admit: 2013-07-08 | Discharge: 2013-07-08 | Disposition: A | Discharge status: Home / Self Care | Payer: Medicare Other | Source: Ambulatory Visit | Attending: Ophthalmology | Admitting: Ophthalmology

## 2013-07-08 ENCOUNTER — Encounter (HOSPITAL_COMMUNITY): Payer: Self-pay | Admitting: *Deleted

## 2013-07-08 ENCOUNTER — Ambulatory Visit (HOSPITAL_COMMUNITY): Payer: Medicare Other | Admitting: Anesthesiology

## 2013-07-08 ENCOUNTER — Encounter (HOSPITAL_COMMUNITY)
Admission: RE | Disposition: A | Discharge status: Home / Self Care | Payer: Self-pay | Source: Ambulatory Visit | Attending: Ophthalmology

## 2013-07-08 ENCOUNTER — Encounter (HOSPITAL_COMMUNITY): Payer: Medicare Other | Admitting: Anesthesiology

## 2013-07-08 DIAGNOSIS — E119 Type 2 diabetes mellitus without complications: Secondary | ICD-10-CM | POA: Insufficient documentation

## 2013-07-08 DIAGNOSIS — Z01812 Encounter for preprocedural laboratory examination: Secondary | ICD-10-CM | POA: Insufficient documentation

## 2013-07-08 DIAGNOSIS — H2510 Age-related nuclear cataract, unspecified eye: Secondary | ICD-10-CM | POA: Insufficient documentation

## 2013-07-08 DIAGNOSIS — I10 Essential (primary) hypertension: Secondary | ICD-10-CM | POA: Insufficient documentation

## 2013-07-08 HISTORY — PX: CATARACT EXTRACTION W/PHACO: SHX586

## 2013-07-08 LAB — GLUCOSE, CAPILLARY: Glucose-Capillary: 182 mg/dL — ABNORMAL HIGH (ref 70–99)

## 2013-07-08 SURGERY — PHACOEMULSIFICATION, CATARACT, WITH IOL INSERTION
Anesthesia: Monitor Anesthesia Care | Site: Eye | Laterality: Left

## 2013-07-08 MED ORDER — EPINEPHRINE HCL 1 MG/ML IJ SOLN
INTRAOCULAR | Status: DC | PRN
  Administered 2013-07-08: 08:00:00

## 2013-07-08 MED ORDER — BSS IO SOLN
INTRAOCULAR | Status: DC | PRN
  Administered 2013-07-08: 15 mL via INTRAOCULAR

## 2013-07-08 MED ORDER — LIDOCAINE HCL (PF) 1 % IJ SOLN
INTRAMUSCULAR | Status: DC | PRN
  Administered 2013-07-08: .5 mL

## 2013-07-08 MED ORDER — PROVISC 10 MG/ML IO SOLN
INTRAOCULAR | Status: DC | PRN
  Administered 2013-07-08: 0.85 mL via INTRAOCULAR

## 2013-07-08 MED ORDER — LIDOCAINE 3.5 % OP GEL OPTIME - NO CHARGE
OPHTHALMIC | Status: DC | PRN
  Administered 2013-07-08: 1 [drp] via OPHTHALMIC

## 2013-07-08 MED ORDER — FENTANYL CITRATE 0.05 MG/ML IJ SOLN
INTRAMUSCULAR | Status: AC
  Filled 2013-07-08: qty 2

## 2013-07-08 MED ORDER — NA CHONDROIT SULF-NA HYALURON 40-30 MG/ML IO SOLN
INTRAOCULAR | Status: DC | PRN
  Administered 2013-07-08: 0.5 mL via INTRAOCULAR

## 2013-07-08 MED ORDER — NEOMYCIN-POLYMYXIN-DEXAMETH 3.5-10000-0.1 OP SUSP
OPHTHALMIC | Status: DC | PRN
  Administered 2013-07-08: 2 [drp] via OPHTHALMIC

## 2013-07-08 MED ORDER — MIDAZOLAM HCL 2 MG/2ML IJ SOLN
INTRAMUSCULAR | Status: AC
  Filled 2013-07-08: qty 2

## 2013-07-08 MED ORDER — PHENYLEPHRINE HCL 2.5 % OP SOLN
1.0000 [drp] | OPHTHALMIC | Status: AC
  Administered 2013-07-08 (×3): 1 [drp] via OPHTHALMIC

## 2013-07-08 MED ORDER — LIDOCAINE HCL 3.5 % OP GEL
1.0000 "application " | Freq: Once | OPHTHALMIC | Status: AC
  Administered 2013-07-08: 1 via OPHTHALMIC

## 2013-07-08 MED ORDER — FENTANYL CITRATE 0.05 MG/ML IJ SOLN
25.0000 ug | INTRAMUSCULAR | Status: AC
  Administered 2013-07-08 (×2): 25 ug via INTRAVENOUS

## 2013-07-08 MED ORDER — TETRACAINE HCL 0.5 % OP SOLN
1.0000 [drp] | OPHTHALMIC | Status: AC
  Administered 2013-07-08 (×3): 1 [drp] via OPHTHALMIC

## 2013-07-08 MED ORDER — LACTATED RINGERS IV SOLN
INTRAVENOUS | Status: DC | PRN
  Administered 2013-07-08: 07:00:00 via INTRAVENOUS

## 2013-07-08 MED ORDER — LACTATED RINGERS IV SOLN
INTRAVENOUS | Status: DC
  Administered 2013-07-08: 1000 mL via INTRAVENOUS

## 2013-07-08 MED ORDER — EPINEPHRINE HCL 1 MG/ML IJ SOLN
INTRAMUSCULAR | Status: AC
  Filled 2013-07-08: qty 1

## 2013-07-08 MED ORDER — CYCLOPENTOLATE-PHENYLEPHRINE 0.2-1 % OP SOLN
1.0000 [drp] | OPHTHALMIC | Status: AC
  Administered 2013-07-08 (×3): 1 [drp] via OPHTHALMIC

## 2013-07-08 MED ORDER — POVIDONE-IODINE 5 % OP SOLN
OPHTHALMIC | Status: DC | PRN
  Administered 2013-07-08: 1 via OPHTHALMIC

## 2013-07-08 MED ORDER — MIDAZOLAM HCL 2 MG/2ML IJ SOLN
1.0000 mg | INTRAMUSCULAR | Status: DC | PRN
  Administered 2013-07-08: 2 mg via INTRAVENOUS

## 2013-07-08 SURGICAL SUPPLY — 32 items
CAPSULAR TENSION RING-AMO (OPHTHALMIC RELATED) IMPLANT
CLOTH BEACON ORANGE TIMEOUT ST (SAFETY) ×2 IMPLANT
EYE SHIELD UNIVERSAL CLEAR (GAUZE/BANDAGES/DRESSINGS) ×2 IMPLANT
GLOVE BIO SURGEON STRL SZ 6.5 (GLOVE) IMPLANT
GLOVE BIOGEL PI IND STRL 6.5 (GLOVE) ×1 IMPLANT
GLOVE BIOGEL PI IND STRL 7.0 (GLOVE) IMPLANT
GLOVE BIOGEL PI IND STRL 7.5 (GLOVE) IMPLANT
GLOVE BIOGEL PI INDICATOR 6.5 (GLOVE) ×1
GLOVE BIOGEL PI INDICATOR 7.0 (GLOVE)
GLOVE BIOGEL PI INDICATOR 7.5 (GLOVE)
GLOVE ECLIPSE 6.5 STRL STRAW (GLOVE) IMPLANT
GLOVE ECLIPSE 7.0 STRL STRAW (GLOVE) IMPLANT
GLOVE ECLIPSE 7.5 STRL STRAW (GLOVE) IMPLANT
GLOVE EXAM NITRILE LRG STRL (GLOVE) IMPLANT
GLOVE EXAM NITRILE MD LF STRL (GLOVE) ×2 IMPLANT
GLOVE SKINSENSE NS SZ6.5 (GLOVE)
GLOVE SKINSENSE NS SZ7.0 (GLOVE)
GLOVE SKINSENSE STRL SZ6.5 (GLOVE) IMPLANT
GLOVE SKINSENSE STRL SZ7.0 (GLOVE) IMPLANT
KIT VITRECTOMY (OPHTHALMIC RELATED) IMPLANT
PAD ARMBOARD 7.5X6 YLW CONV (MISCELLANEOUS) ×2 IMPLANT
PROC W NO LENS (INTRAOCULAR LENS)
PROC W SPEC LENS (INTRAOCULAR LENS)
PROCESS W NO LENS (INTRAOCULAR LENS) IMPLANT
PROCESS W SPEC LENS (INTRAOCULAR LENS) IMPLANT
RING MALYGIN (MISCELLANEOUS) IMPLANT
SIGHTPATH CAT PROC W REG LENS (Ophthalmic Related) ×2 IMPLANT
SYR TB 1ML LL NO SAFETY (SYRINGE) ×2 IMPLANT
TAPE SURG TRANSPORE 1 IN (GAUZE/BANDAGES/DRESSINGS) ×1 IMPLANT
TAPE SURGICAL TRANSPORE 1 IN (GAUZE/BANDAGES/DRESSINGS) ×1
VISCOELASTIC ADDITIONAL (OPHTHALMIC RELATED) ×2 IMPLANT
WATER STERILE IRR 250ML POUR (IV SOLUTION) ×2 IMPLANT

## 2013-07-08 NOTE — Preoperative (Signed)
Beta Blockers   Reason not to administer Beta Blockers:Not Applicable 

## 2013-07-08 NOTE — Op Note (Signed)
Date of Admission: 07/08/2013  Date of Surgery: 07/08/2013  Pre-Op Dx: Cataract  Left  Eye  Post-Op Dx: Cataract  Left  Eye,  Dx Code 366.16  Surgeon: Gemma Payor, M.D.  Assistants: None  Anesthesia: Topical with MAC  Indications: Painless, progressive loss of vision with compromise of daily activities.  Surgery: Cataract Extraction with Intraocular lens Implant Left Eye  Discription: The patient had dilating drops and viscous lidocaine placed into the left eye in the pre-op holding area. After transfer to the operating room, a time out was performed. The patient was then prepped and draped. Beginning with a 75 degree blade a paracentesis port was made at the surgeon's 2 o'clock position. The anterior chamber was then filled with 1% non-preserved lidocaine. This was followed by filling the anterior chamber with Provisc. A 2.36mm keratome blade was used to make a clear corneal incision at the temporal limbus. A bent cystatome needle was used to create a continuous tear capsulotomy. Hydrodissection was performed with balanced salt solution on a Fine canula. The lens nucleus was then removed using the phacoemulsification handpiece. Residual cortex was removed with the I&A handpiece. The anterior chamber and capsular bag were refilled with Provisc. A posterior chamber intraocular lens was placed into the capsular bag with it's injector. The implant was positioned with the Kuglan hook. The Provisc was then removed from the anterior chamber and capsular bag with the I&A handpiece. Stromal hydration of the main incision and paracentesis port was performed with BSS on a Fine canula. The wounds were tested for leak which was negative. The patient tolerated the procedure well. There were no operative complications. The patient was then transferred to the recovery room in stable condition.  Complications: None  Specimen: None  EBL: None  Prosthetic device: B&L enVista, MX60, power 20.5, SN [id number].

## 2013-07-08 NOTE — Anesthesia Postprocedure Evaluation (Signed)
  Anesthesia Post-op Note  Patient: [name]  Procedure(s) Performed: Procedure(s) with comments: CATARACT EXTRACTION PHACO AND INTRAOCULAR LENS PLACEMENT (IOC) (Left) - CDE:24.66  Patient Location: Short Stay  Anesthesia Type:MAC  Level of Consciousness: awake, alert , oriented and patient cooperative  Airway and Oxygen Therapy: Patient Spontanous Breathing  Post-op Pain: none  Post-op Assessment: Post-op Vital signs reviewed, Patient's Cardiovascular Status Stable and Respiratory Function Stable  Post-op Vital Signs: Reviewed and stable  Complications: No apparent anesthesia complications

## 2013-07-08 NOTE — H&P (Signed)
I have reviewed the H&P, the patient was re-examined, and I have identified no interval changes in medical condition and plan of care since the history and physical of record  

## 2013-07-08 NOTE — Anesthesia Procedure Notes (Signed)
Procedure Name: MAC Performed by: ADAMS, AMY A Pre-anesthesia Checklist: Patient identified, Timeout performed, Emergency Drugs available, Suction available and Patient being monitored Oxygen Delivery Method: Nasal cannula     

## 2013-07-08 NOTE — Anesthesia Preprocedure Evaluation (Signed)
Anesthesia Evaluation   Patient awake    Reviewed: Allergy & Precautions, H&P , NPO status , Patient's Chart, lab work & pertinent test results, reviewed documented beta blocker date and time   Airway Mallampati: II TM Distance: >3 FB     Dental  (+) Teeth Intact and Chipped   Pulmonary former smoker,  breath sounds clear to auscultation        Cardiovascular hypertension, + CAD, + Past MI and +CHF + dysrhythmias Atrial Fibrillation + Cardiac Defibrillator Rhythm:Irregular Rate:Normal     Neuro/Psych    GI/Hepatic negative GI ROS,   Endo/Other  diabetes, Type 2, Oral Hypoglycemic Agents  Renal/GU      Musculoskeletal   Abdominal   Peds  Hematology   Anesthesia Other Findings   Reproductive/Obstetrics                           Anesthesia Physical Anesthesia Plan  ASA: III  Anesthesia Plan: MAC   Post-op Pain Management:    Induction: Intravenous  Airway Management Planned: Nasal Cannula  Additional Equipment:   Intra-op Plan:   Post-operative Plan:   Informed Consent: I have reviewed the patients History and Physical, chart, labs and discussed the procedure including the risks, benefits and alternatives for the proposed anesthesia with the patient or authorized representative who has indicated his/her understanding and acceptance.     Plan Discussed with:   Anesthesia Plan Comments:         Anesthesia Quick Evaluation

## 2013-07-08 NOTE — Transfer of Care (Signed)
Immediate Anesthesia Transfer of Care Note  Patient: [name]  Procedure(s) Performed: Procedure(s) with comments: CATARACT EXTRACTION PHACO AND INTRAOCULAR LENS PLACEMENT (IOC) (Left) - CDE:24.66  Patient Location: Short Stay  Anesthesia Type:MAC  Level of Consciousness: awake, alert , oriented and patient cooperative  Airway & Oxygen Therapy: Patient Spontanous Breathing  Post-op Assessment: Report given to PACU RN and Post -op Vital signs reviewed and stable  Post vital signs: Reviewed and stable  Complications: No apparent anesthesia complications

## 2013-07-12 ENCOUNTER — Encounter (HOSPITAL_COMMUNITY): Payer: Self-pay | Admitting: Ophthalmology

## 2013-08-11 ENCOUNTER — Other Ambulatory Visit: Payer: Self-pay | Admitting: *Deleted

## 2013-08-11 MED ORDER — DIGOXIN 125 MCG PO TABS: 125.0000 ug | ORAL_TABLET | Freq: Every day | ORAL | Status: DC

## 2013-08-11 MED ORDER — FUROSEMIDE 40 MG PO TABS: 20.0000 mg | ORAL_TABLET | Freq: Two times a day (BID) | ORAL | Status: DC

## 2013-08-11 MED ORDER — LISINOPRIL 10 MG PO TABS: 10.0000 mg | ORAL_TABLET | Freq: Every day | ORAL | Status: DC

## 2013-08-11 MED ORDER — SIMVASTATIN 40 MG PO TABS: 40.0000 mg | ORAL_TABLET | Freq: Every day | ORAL | Status: DC

## 2013-08-16 ENCOUNTER — Other Ambulatory Visit: Payer: Self-pay | Admitting: *Deleted

## 2013-08-16 MED ORDER — CARVEDILOL 25 MG PO TABS: 25.0000 mg | ORAL_TABLET | Freq: Two times a day (BID) | ORAL | Status: DC

## 2013-08-18 ENCOUNTER — Telehealth: Payer: Self-pay | Admitting: Internal Medicine

## 2013-08-18 NOTE — Telephone Encounter (Signed)
New problem          [name] pt ref cord. Called for Dr NPI # that will read pt's transmition on 1/16. She also needs last 2 visit notes with a DX code to send to [facility] for pre Auth.  This will be done for each remote transmition.   Fax [phone] Attn: Selena Batten.

## 2013-08-19 NOTE — Telephone Encounter (Signed)
Spoke w/ Kim @ [facility]. [name] states due to changes in [place] as of 08/05/13, her facility will need the results of the home monitoring report scheduled for 09/16/2013 along with the Dx codes we will file for charges. Kim states [facility]/Dr. Johney Frame will not get paid unless [facility] receives all of [name] remote monitoring results. [name] is also requesting previous home monitoring reports & Dx filing codes for their records.

## 2013-08-20 ENCOUNTER — Ambulatory Visit (INDEPENDENT_AMBULATORY_CARE_PROVIDER_SITE_OTHER): Payer: Medicare HMO | Admitting: *Deleted

## 2013-08-20 DIAGNOSIS — I428 Other cardiomyopathies: Secondary | ICD-10-CM

## 2013-08-20 DIAGNOSIS — I420 Dilated cardiomyopathy: Secondary | ICD-10-CM

## 2013-08-20 DIAGNOSIS — I4891 Unspecified atrial fibrillation: Secondary | ICD-10-CM

## 2013-08-20 DIAGNOSIS — I5022 Chronic systolic (congestive) heart failure: Secondary | ICD-10-CM

## 2013-08-23 LAB — MDC_IDC_ENUM_SESS_TYPE_REMOTE
Battery Remaining Longevity: 3 mo
Battery Voltage: 2.5 V
Brady Statistic AP VP Percent: 12 %
Brady Statistic AP VS Percent: 1 %
Brady Statistic AS VP Percent: 88 %
Brady Statistic AS VS Percent: 1 %
Date Time Interrogation Session: 20150116075328
HighPow Impedance: 52 Ohm
Lead Channel Impedance Value: 430 Ohm
Lead Channel Impedance Value: 750 Ohm
Lead Channel Pacing Threshold Amplitude: 0.5 V
Lead Channel Pacing Threshold Amplitude: 0.5 V
Lead Channel Pacing Threshold Amplitude: 1.5 V
Lead Channel Pacing Threshold Pulse Width: 0.5 ms
Lead Channel Pacing Threshold Pulse Width: 1 ms
Lead Channel Sensing Intrinsic Amplitude: 12 mV
Lead Channel Sensing Intrinsic Amplitude: 4.7 mV
Lead Channel Setting Pacing Amplitude: 2 V
Lead Channel Setting Pacing Amplitude: 2.5 V
Lead Channel Setting Pacing Pulse Width: 0.5 ms
Lead Channel Setting Pacing Pulse Width: 1 ms
MDC IDC MSMT LEADCHNL RA IMPEDANCE VALUE: 430 Ohm
MDC IDC MSMT LEADCHNL RV PACING THRESHOLD PULSEWIDTH: 0.5 ms
MDC IDC PG SERIAL: 539316
MDC IDC SET LEADCHNL RV PACING AMPLITUDE: 2.5 V
MDC IDC SET LEADCHNL RV SENSING SENSITIVITY: 0.3 mV
MDC IDC SET ZONE DETECTION INTERVAL: 300 ms
MDC IDC STAT BRADY RA PERCENT PACED: 12 %
Zone Setting Detection Interval: 250 ms

## 2013-08-30 ENCOUNTER — Encounter: Payer: Self-pay | Admitting: *Deleted

## 2013-09-01 ENCOUNTER — Encounter: Payer: Self-pay | Admitting: Internal Medicine

## 2013-10-24 ENCOUNTER — Encounter: Payer: Self-pay | Admitting: Internal Medicine

## 2013-10-25 ENCOUNTER — Ambulatory Visit (INDEPENDENT_AMBULATORY_CARE_PROVIDER_SITE_OTHER): Payer: Commercial Managed Care - HMO | Admitting: *Deleted

## 2013-10-25 ENCOUNTER — Telehealth: Payer: Self-pay | Admitting: Internal Medicine

## 2013-10-25 DIAGNOSIS — I4891 Unspecified atrial fibrillation: Secondary | ICD-10-CM

## 2013-10-25 NOTE — Telephone Encounter (Signed)
Patient is getting anxious---has not had a return call from this am.   Please call asap.

## 2013-10-25 NOTE — Telephone Encounter (Signed)
Spoke with patient and scheduled appointment for 10-27-13 @ 1530 with [facility] in [place] office. Device reached ERI.

## 2013-10-25 NOTE — Telephone Encounter (Signed)
New message    defibulator "buzzed" several times Saturday night.  It did not shock him---has not buzzed since saturday

## 2013-10-26 LAB — MDC_IDC_ENUM_SESS_TYPE_REMOTE
Battery Voltage: 2.41 V
Brady Statistic AP VP Percent: 12 %
Brady Statistic AS VP Percent: 88 %
Brady Statistic AS VS Percent: 1 %
Brady Statistic RA Percent Paced: 12 %
Date Time Interrogation Session: 20150322081847
HIGH POWER IMPEDANCE MEASURED VALUE: 50 Ohm
Lead Channel Pacing Threshold Amplitude: 1.5 V
Lead Channel Pacing Threshold Pulse Width: 1 ms
Lead Channel Setting Pacing Amplitude: 2 V
Lead Channel Setting Pacing Amplitude: 2.5 V
Lead Channel Setting Pacing Pulse Width: 0.5 ms
Lead Channel Setting Pacing Pulse Width: 1 ms
Lead Channel Setting Sensing Sensitivity: 0.3 mV
MDC IDC MSMT BATTERY REMAINING LONGEVITY: 0 mo
MDC IDC MSMT LEADCHNL LV IMPEDANCE VALUE: 440 Ohm
MDC IDC MSMT LEADCHNL RA IMPEDANCE VALUE: 440 Ohm
MDC IDC MSMT LEADCHNL RA SENSING INTR AMPL: 3.9 mV
MDC IDC MSMT LEADCHNL RV IMPEDANCE VALUE: 710 Ohm
MDC IDC PG SERIAL: 539316
MDC IDC SET LEADCHNL RV PACING AMPLITUDE: 2.5 V
MDC IDC SET ZONE DETECTION INTERVAL: 300 ms
MDC IDC STAT BRADY AP VS PERCENT: 1 %
Zone Setting Detection Interval: 250 ms

## 2013-10-27 ENCOUNTER — Encounter: Payer: Self-pay | Admitting: *Deleted

## 2013-10-27 ENCOUNTER — Other Ambulatory Visit: Payer: Self-pay | Admitting: *Deleted

## 2013-10-27 ENCOUNTER — Telehealth: Payer: Self-pay | Admitting: Internal Medicine

## 2013-10-27 ENCOUNTER — Encounter: Payer: Self-pay | Admitting: Internal Medicine

## 2013-10-27 ENCOUNTER — Ambulatory Visit (INDEPENDENT_AMBULATORY_CARE_PROVIDER_SITE_OTHER): Payer: Medicare HMO | Admitting: Internal Medicine

## 2013-10-27 VITALS — BP 116/72 | HR 76 | Ht 72.0 in | Wt 211.1 lb

## 2013-10-27 DIAGNOSIS — Z01810 Encounter for preprocedural cardiovascular examination: Secondary | ICD-10-CM

## 2013-10-27 DIAGNOSIS — I4891 Unspecified atrial fibrillation: Secondary | ICD-10-CM

## 2013-10-27 DIAGNOSIS — I428 Other cardiomyopathies: Secondary | ICD-10-CM

## 2013-10-27 DIAGNOSIS — I420 Dilated cardiomyopathy: Secondary | ICD-10-CM

## 2013-10-27 DIAGNOSIS — Z9119 Patient's noncompliance with other medical treatment and regimen: Secondary | ICD-10-CM

## 2013-10-27 DIAGNOSIS — Z91199 Patient's noncompliance with other medical treatment and regimen due to unspecified reason: Secondary | ICD-10-CM

## 2013-10-27 DIAGNOSIS — Z95810 Presence of automatic (implantable) cardiac defibrillator: Secondary | ICD-10-CM

## 2013-10-27 DIAGNOSIS — I2510 Atherosclerotic heart disease of native coronary artery without angina pectoris: Secondary | ICD-10-CM

## 2013-10-27 DIAGNOSIS — I5022 Chronic systolic (congestive) heart failure: Secondary | ICD-10-CM

## 2013-10-27 NOTE — Progress Notes (Signed)
PCP:  SASSER,PAUL W, MD  The patient presents today for routine electrophysiology followup.  He has reached ERI battery status on 10/23/13.  He continues to have difficulty with diabetes management, largely due to compliance.  Today, he denies symptoms of palpitations, chest pain, shortness of breath, orthopnea, PND, lower extremity edema, dizziness, presyncope, syncope, or neurologic sequela.  The patient feels that he is tolerating medications without difficulties and is otherwise without complaint today.   Past Medical History  Diagnosis Date  . CAD (coronary artery disease)     Minimal nonobstructive CAD at catheterization 2009  . Chronic systolic heart failure   . LBBB (left bundle branch block)   . Secondary cardiomyopathy     Nonischemic  . Type 2 diabetes mellitus   . Atrial fibrillation     Declines anticoagulation  . Myocardial infarction   . CHF (congestive heart failure)   . Automatic implantable cardioverter-defibrillator in situ     Dr. Allread  . Hypertension   . Hyperlipidemia    Past Surgical History  Procedure Laterality Date  . Hernia repair    . Fiscula repair    . Hearing surgery    . Cholecystectomy    . Cardiac defibrillator placement  12/14/2007    St. Jude Promote RF 3207 ICD placed by Dr. Klein for CHF, nonischemic cardiomyopathy  . Cataract extraction w/phaco Right 06/14/2013    Procedure: CATARACT EXTRACTION PHACO AND INTRAOCULAR LENS PLACEMENT (IOC);  Surgeon: Kerry Hunt, MD;  Location: [facility];  Service: Ophthalmology;  Laterality: Right;  CDE:  21.74  . Cataract extraction w/phaco Left 07/08/2013    Procedure: CATARACT EXTRACTION PHACO AND INTRAOCULAR LENS PLACEMENT (IOC);  Surgeon: Kerry Hunt, MD;  Location: [facility];  Service: Ophthalmology;  Laterality: Left;  CDE:24.66    Current Outpatient Prescriptions  Medication Sig Dispense Refill  . aspirin 325 MG EC tablet Take 325 mg by mouth every morning. Takes after breakfast      . aspirin 81 MG tablet  Take 81 mg by mouth at bedtime.       . carvedilol (COREG) 25 MG tablet Take 1 tablet (25 mg total) by mouth 2 (two) times daily with a meal.  180 tablet  3  . cholecalciferol (VITAMIN D) 1000 UNITS tablet Take 1,000 Units by mouth daily.      . Cinnamon 500 MG TABS Take 1 tablet by mouth daily.       . digoxin (LANOXIN) 0.125 MG tablet Take 1 tablet (125 mcg total) by mouth daily.  90 tablet  3  . Echinacea 400 MG CAPS Take 1 capsule by mouth every morning.      . furosemide (LASIX) 40 MG tablet Take 0.5 tablets (20 mg total) by mouth 2 (two) times daily.  90 tablet  3  . glipiZIDE (GLUCOTROL XL) 10 MG 24 hr tablet Take 10 mg by mouth 2 (two) times daily.      . lisinopril (PRINIVIL,ZESTRIL) 10 MG tablet Take 1 tablet (10 mg total) by mouth daily.  90 tablet  3  . niacin 500 MG tablet Take 500 mg by mouth at bedtime.       . simvastatin (ZOCOR) 40 MG tablet Take 1 tablet (40 mg total) by mouth at bedtime.  90 tablet  3  . vitamin B-12 (CYANOCOBALAMIN) 500 MCG tablet Take 1 tablet by mouth daily.        No current facility-administered medications for this visit.    Allergies  Allergen Reactions  .   Penicillins Hives and Rash    History   Social History  . Marital Status: Married    Spouse Name: N/A    Number of Children: N/A  . Years of Education: N/A   Occupational History  . Retired    Social History Main Topics  . Smoking status: Former Smoker -- 0.30 packs/day for 5 years    Types: Cigarettes    Quit date: 08/05/1965  . Smokeless tobacco: Never Used  . Alcohol Use: No  . Drug Use: No  . Sexual Activity: Yes    Birth Control/ Protection: None   Other Topics Concern  . Not on file   Social History Narrative  . No narrative on file    Family History  Problem Relation Age of Onset  . Cancer    . Coronary artery disease    . Diabetes        Physical Exam: Filed Vitals:   10/27/13 1535  BP: 116/72  Pulse: 76  Height: 6' (1.829 m)  Weight: 211 lb 1.9 oz  (95.763 kg)  SpO2: 95%    GEN- The patient is well appearing, alert and oriented x 3 today.   Head- normocephalic, atraumatic Eyes-  Sclera clear, conjunctiva pink Ears- hearing intact Oropharynx- clear Neck- supple, no JVP Lymph- no cervical lymphadenopathy Lungs- Clear to ausculation bilaterally, normal work of breathing Chest- ICD pocket is well healed Heart- Regular rate and rhythm, no murmurs, rubs or gallops, PMI not laterally displaced GI- soft, NT, ND, + BS Extremities- no clubbing, cyanosis, or edema Neuro- strength and sensation are intact  ICD interrogation- reviewed in detail today,  See PACEART report  Assessment and Plan:  1. Chronic systolic dysfunction BiV ICD has reached ERI battery status See Arita Miss Art report No changes today Risks, benefits, and alternatives to BiV ICD generator change were discussed in detail with the patient and his spouse today.  They understand risks and wish to proceed.  We will schedule the procedure at then next available time.  I will obtain an echo prior to generator change to follow-up on his EF.  2. afib No recent afib by device interrogation He remains clear in his decision to decline anticoagulation therapy  3. DM We had a long discussion today regarding the importance of appropriate follow-up and compliance with Dr Neita Carp for diabetes management.  He continues to have poor insight and is resistant to compliance with therapies.

## 2013-10-27 NOTE — Patient Instructions (Signed)
   Continue all current medications.  Generator change - see info sheet given   Labs for generator change - BMET, CBC, PT, PTT - can do these after November 09, 2013 Follow up will be given after discharge from procedure

## 2013-10-27 NOTE — Telephone Encounter (Signed)
Generator change - 11/23/2013 -12:30 - Dr. Johney Frame  Checking percert seen in [facility] on 10-27-13

## 2013-10-28 ENCOUNTER — Other Ambulatory Visit: Payer: Self-pay | Admitting: *Deleted

## 2013-10-28 DIAGNOSIS — I428 Other cardiomyopathies: Secondary | ICD-10-CM

## 2013-10-28 NOTE — Telephone Encounter (Signed)
Tried to call patient to let him know Dr Johney Frame wanted him to have a Echo prior to the generator change.  Will have the [facility] office call him with time

## 2013-11-02 ENCOUNTER — Encounter: Payer: Self-pay | Admitting: Internal Medicine

## 2013-11-02 ENCOUNTER — Telehealth: Payer: Self-pay | Admitting: Internal Medicine

## 2013-11-02 NOTE — Telephone Encounter (Signed)
Message copied by Megan Salon on Tue Nov 02, 2013 10:10 AM ------      Message from: Glynda Jaeger A      Created: Thu Oct 28, 2013  4:26 PM       Hello!! Dr. Johney Frame needs this pt to have an echo in [place] before his generator change 11-23-13 , He forgot to tell the pt while he was here so his nurse Tresa Endo called and got his voicemail. She left a message that you would be calling to set this up.            Thanks!!            Melissa ------

## 2013-11-02 NOTE — Telephone Encounter (Signed)
11-02-13 sent pt letter to call [facility] office to set up echo prior to gen change 11-23-13/mt

## 2013-11-02 NOTE — Telephone Encounter (Signed)
11-02-13 Called and left message with Mr. Hooey -needs echo set up before 11-23-13.

## 2013-11-09 ENCOUNTER — Telehealth: Payer: Self-pay | Admitting: Internal Medicine

## 2013-11-09 NOTE — Telephone Encounter (Signed)
[employer] [phone] for 2D echo, CPT 93306, valid 4-16 to 02-16-14 and Auth#[id number] for ICD Chg, CPT 33249, valid 4-4 to 02-04-14

## 2013-11-09 NOTE — Telephone Encounter (Signed)
Checking percert for procedure performed by Dr. Johney Frame on 11/23/13  Also, patient called wanting to know if his echo has been pre-cert ed.

## 2013-11-10 ENCOUNTER — Other Ambulatory Visit: Payer: Self-pay | Admitting: Internal Medicine

## 2013-11-10 ENCOUNTER — Encounter (HOSPITAL_COMMUNITY): Payer: Self-pay | Admitting: Pharmacy Technician

## 2013-11-10 LAB — CBC
HCT: 43.3 % (ref 39.0–52.0)
Hemoglobin: 15.1 g/dL (ref 13.0–17.0)
MCH: 32.5 pg (ref 26.0–34.0)
MCHC: 34.9 g/dL (ref 30.0–36.0)
MCV: 93.1 fL (ref 78.0–100.0)
PLATELETS: 172 10*3/uL (ref 150–400)
RBC: 4.65 MIL/uL (ref 4.22–5.81)
RDW: 13.5 % (ref 11.5–15.5)
WBC: 6.2 10*3/uL (ref 4.0–10.5)

## 2013-11-10 LAB — BASIC METABOLIC PANEL
BUN: 20 mg/dL (ref 6–23)
CALCIUM: 9.1 mg/dL (ref 8.4–10.5)
CO2: 26 mEq/L (ref 19–32)
CREATININE: 1.01 mg/dL (ref 0.50–1.35)
Chloride: 99 mEq/L (ref 96–112)
Glucose, Bld: 296 mg/dL — ABNORMAL HIGH (ref 70–99)
Potassium: 4.8 mEq/L (ref 3.5–5.3)
Sodium: 134 mEq/L — ABNORMAL LOW (ref 135–145)

## 2013-11-10 LAB — PROTIME-INR
INR: 1.01 (ref <1.50)
PROTHROMBIN TIME: 13.2 s (ref 11.6–15.2)

## 2013-11-10 LAB — APTT: APTT: 29 s (ref 24–37)

## 2013-11-18 ENCOUNTER — Other Ambulatory Visit (INDEPENDENT_AMBULATORY_CARE_PROVIDER_SITE_OTHER): Payer: Medicare HMO

## 2013-11-18 ENCOUNTER — Other Ambulatory Visit: Payer: Self-pay

## 2013-11-18 DIAGNOSIS — I359 Nonrheumatic aortic valve disorder, unspecified: Secondary | ICD-10-CM

## 2013-11-18 DIAGNOSIS — I428 Other cardiomyopathies: Secondary | ICD-10-CM

## 2013-11-18 DIAGNOSIS — I059 Rheumatic mitral valve disease, unspecified: Secondary | ICD-10-CM

## 2013-11-22 MED ORDER — VANCOMYCIN HCL IN DEXTROSE 1-5 GM/200ML-% IV SOLN
1000.0000 mg | INTRAVENOUS | Status: DC
  Filled 2013-11-22: qty 200

## 2013-11-22 MED ORDER — SODIUM CHLORIDE 0.9 % IR SOLN
80.0000 mg | Status: DC
  Filled 2013-11-22: qty 2

## 2013-11-23 ENCOUNTER — Ambulatory Visit (HOSPITAL_COMMUNITY)
Admission: RE | Admit: 2013-11-23 | Discharge: 2013-11-23 | Disposition: A | Discharge status: Home / Self Care | Payer: Medicare HMO | Source: Ambulatory Visit | Attending: Internal Medicine | Admitting: Internal Medicine

## 2013-11-23 ENCOUNTER — Encounter (HOSPITAL_COMMUNITY)
Admission: RE | Disposition: A | Discharge status: Home / Self Care | Payer: Self-pay | Source: Ambulatory Visit | Attending: Internal Medicine

## 2013-11-23 DIAGNOSIS — I509 Heart failure, unspecified: Secondary | ICD-10-CM | POA: Insufficient documentation

## 2013-11-23 DIAGNOSIS — Z4502 Encounter for adjustment and management of automatic implantable cardiac defibrillator: Secondary | ICD-10-CM | POA: Insufficient documentation

## 2013-11-23 DIAGNOSIS — I447 Left bundle-branch block, unspecified: Secondary | ICD-10-CM | POA: Insufficient documentation

## 2013-11-23 DIAGNOSIS — I420 Dilated cardiomyopathy: Secondary | ICD-10-CM | POA: Diagnosis present

## 2013-11-23 DIAGNOSIS — Z7982 Long term (current) use of aspirin: Secondary | ICD-10-CM | POA: Insufficient documentation

## 2013-11-23 DIAGNOSIS — Z79899 Other long term (current) drug therapy: Secondary | ICD-10-CM | POA: Insufficient documentation

## 2013-11-23 DIAGNOSIS — I10 Essential (primary) hypertension: Secondary | ICD-10-CM | POA: Insufficient documentation

## 2013-11-23 DIAGNOSIS — I4891 Unspecified atrial fibrillation: Secondary | ICD-10-CM | POA: Insufficient documentation

## 2013-11-23 DIAGNOSIS — E785 Hyperlipidemia, unspecified: Secondary | ICD-10-CM | POA: Insufficient documentation

## 2013-11-23 DIAGNOSIS — I428 Other cardiomyopathies: Secondary | ICD-10-CM

## 2013-11-23 DIAGNOSIS — I2510 Atherosclerotic heart disease of native coronary artery without angina pectoris: Secondary | ICD-10-CM | POA: Insufficient documentation

## 2013-11-23 DIAGNOSIS — E119 Type 2 diabetes mellitus without complications: Secondary | ICD-10-CM | POA: Insufficient documentation

## 2013-11-23 DIAGNOSIS — Z95810 Presence of automatic (implantable) cardiac defibrillator: Secondary | ICD-10-CM | POA: Diagnosis present

## 2013-11-23 DIAGNOSIS — Z87891 Personal history of nicotine dependence: Secondary | ICD-10-CM | POA: Insufficient documentation

## 2013-11-23 DIAGNOSIS — I5022 Chronic systolic (congestive) heart failure: Secondary | ICD-10-CM | POA: Diagnosis present

## 2013-11-23 HISTORY — PX: IMPLANTABLE CARDIOVERTER DEFIBRILLATOR GENERATOR CHANGE: SHX5474

## 2013-11-23 LAB — SURGICAL PCR SCREEN
MRSA, PCR: NEGATIVE
STAPHYLOCOCCUS AUREUS: NEGATIVE

## 2013-11-23 LAB — GLUCOSE, CAPILLARY: Glucose-Capillary: 365 mg/dL — ABNORMAL HIGH (ref 70–99)

## 2013-11-23 SURGERY — IMPLANTABLE CARDIOVERTER DEFIBRILLATOR GENERATOR CHANGE
Anesthesia: LOCAL

## 2013-11-23 MED ORDER — MUPIROCIN 2 % EX OINT
TOPICAL_OINTMENT | Freq: Two times a day (BID) | CUTANEOUS | Status: DC
  Filled 2013-11-23: qty 22

## 2013-11-23 MED ORDER — ONDANSETRON HCL 4 MG/2ML IJ SOLN: 4.0000 mg | Freq: Four times a day (QID) | INTRAMUSCULAR | Status: DC | PRN

## 2013-11-23 MED ORDER — LIDOCAINE HCL (PF) 1 % IJ SOLN
INTRAMUSCULAR | Status: AC
  Filled 2013-11-23: qty 60

## 2013-11-23 MED ORDER — SODIUM CHLORIDE 0.9 % IV SOLN: 250.0000 mL | INTRAVENOUS | Status: DC | PRN

## 2013-11-23 MED ORDER — ACETAMINOPHEN 325 MG PO TABS
325.0000 mg | ORAL_TABLET | ORAL | Status: DC | PRN
  Filled 2013-11-23: qty 2

## 2013-11-23 MED ORDER — SODIUM CHLORIDE 0.9 % IV SOLN
INTRAVENOUS | Status: DC
  Administered 2013-11-23: 12:00:00 via INTRAVENOUS

## 2013-11-23 MED ORDER — SODIUM CHLORIDE 0.9 % IJ SOLN: 3.0000 mL | Freq: Two times a day (BID) | INTRAMUSCULAR | Status: DC

## 2013-11-23 MED ORDER — HYDROCODONE-ACETAMINOPHEN 5-325 MG PO TABS: 1.0000 | ORAL_TABLET | ORAL | Status: DC | PRN

## 2013-11-23 MED ORDER — SODIUM CHLORIDE 0.9 % IJ SOLN: 3.0000 mL | INTRAMUSCULAR | Status: DC | PRN

## 2013-11-23 MED ORDER — CHLORHEXIDINE GLUCONATE 4 % EX LIQD
60.0000 mL | Freq: Once | CUTANEOUS | Status: DC
  Filled 2013-11-23: qty 60

## 2013-11-23 MED ORDER — MUPIROCIN 2 % EX OINT
TOPICAL_OINTMENT | CUTANEOUS | Status: AC
  Administered 2013-11-23: 1
  Filled 2013-11-23: qty 22

## 2013-11-23 NOTE — Op Note (Signed)
SURGEON:  Hillis Range, MD      PREPROCEDURE DIAGNOSES:   1. nonischemic cardiomyopathy/ chronic systolic dysfunction  2. New York Heart Association class II, heart failure chronically.   3. LBBB      POSTPROCEDURE DIAGNOSES:   1. nonischemic cardiomyopathy/ chronic systolic dysfunction  2. New York Heart Association class II, heart failure chronically.   3. LBBB   PROCEDURES:    1.  ICD pulse generator replacement   2. Skin pocket revision     INTRODUCTION:  [name] is a 68 y.o. male an ischemic CM (EF 35%--> previously 15% prior to CRT), NYHA Class II CHF,  LBB, and prior BiV ICD implantation who presents today for BiV ICD pulse generator replacement for ERI battery status.       DESCRIPTION OF PROCEDURE:  Informed written consent was obtained and the patient was brought to the electrophysiology lab in the fasting state.  The patient required no sedation for the procedure today.  The patient's left chest was prepped and draped in the usual sterile fashion by the EP lab staff.  The skin overlying the left deltopectoral region was infiltrated with lidocaine for local analgesia.  A 5-cm incision was made over the existing ICD pocket.  Electrocautery was used to assure hemostasis.  The device was exposed and removed from the pocket. A single silk stitch was identified and removed which previously secured the device within the pocket. The device was disconnected from the leads.  The leads were examined thoroughly and their integrity confirmed to be intact.   The right atrial lead was confirmed to be a SJM model [id number]  (serial # [id number]) lead implanted 12/14/2007.  The right ventricular lead was confirmed to be a SJM model 7121-65 Durata (serial number [id number]) right ventricular defibrillator lead implanted on the same date as the atrial lead.The left ventricular lead was confirmed to be a Database administrator (serial number [id number]) left ventricular lead  implanted on the  same date as the atrial lead..   Atrial lead P-waves measured 4.1 mV with an impedance of 450 ohms and a threshold of 0.75 volts at 0.5 milliseconds.  The right ventricular lead R-wave measured 12 mV with impedance of 712 ohms and a threshold of 0.5 volts at 0.5 milliseconds.  Left ventricular lead impedance was 360 Ohms with a threshold of 1.75 V @ 1.0 sec.  All three leads were then connected to a SJM Unify Assura CRT-D model [id number] (SN [id number]) BiV ICD. The pocket was revised to accomodate this new device.   The pocket was  irrigated with copious gentamicin solution.  The defibrillator was placed into the pocket.  The pocket was then closed in 2 layers with 2.0 Vicryl suture  for the subcutaneous and subcuticular layers.  Steri-Strips and a  sterile dressing were then applied.   Defibrillation Threshold testing was not performed today.  There were no early apparent complications.     CONCLUSIONS:   1. nonischemic cardiomyopathy with chronic New York Heart Association class II heart failure, LBBB, and good response to CRT previously  2. BiV ICD at elective replacement indicator successfully replaced today  3. No early apparent complications.   Fayrene Fearing Ashleigh Luckow,MD 11/23/2013 4:52 PM

## 2013-11-23 NOTE — Interval H&P Note (Signed)
History and Physical Interval Note:  11/23/2013 11:44 AM  [name]  has presented today for surgery, with the diagnosis of Battery Depletion  The various methods of treatment have been discussed with the patient and family. After consideration of risks, benefits and other options for treatment, the patient has consented to  Procedure(s): IMPLANTABLE CARDIOVERTER DEFIBRILLATOR GENERATOR CHANGE (N/A) as a surgical intervention .  The patient's history has been reviewed, patient examined, no change in status, stable for surgery.  I have reviewed the patient's chart and labs.  Questions were answered to the patient's satisfaction.     [name]    ICD Criteria  Current LVEF: 35% ;Obtained < 1 month ago.  NYHA Functional Classification: Class II  Heart Failure History:  Yes, Duration of heart failure since onset is > 9 months  Non-Ischemic Dilated Cardiomyopathy History:  Yes, timeframe is > 9 months  Atrial Fibrillation/Atrial Flutter:  Yes, A-Fib/A-Flutter type: Paroxysmal.  Ventricular Tachycardia History:  No.  Cardiac Arrest History:  No  History of Syndromes with Risk of Sudden Death:  No.  Previous ICD:  Yes, ICD Type:  CRT-D, Reason for ICD:  Primary prevention.  15%  Electrophysiology Study: No.  Prior MI: No.  PPM: No.  OSA:  No  Patient Life Expectancy of >=1 year: Yes.  Anticoagulation Therapy:  Patient is NOT on anticoagulation therapy.   Beta Blocker Therapy:  Yes.   Ace Inhibitor/ARB Therapy:  Yes.

## 2013-11-23 NOTE — Progress Notes (Signed)
Dr. Johney Frame and Amber aware of pts CBG results. Pt states he will not take insulin at this time.  No orders received.

## 2013-11-23 NOTE — H&P (View-Only) (Signed)
PCP:  Estanislado PandySASSER,PAUL W, MD  The patient presents today for routine electrophysiology followup.  He has reached ERI battery status on 10/23/13.  He continues to have difficulty with diabetes management, largely due to compliance.  Today, he denies symptoms of palpitations, chest pain, shortness of breath, orthopnea, PND, lower extremity edema, dizziness, presyncope, syncope, or neurologic sequela.  The patient feels that he is tolerating medications without difficulties and is otherwise without complaint today.   Past Medical History  Diagnosis Date  . CAD (coronary artery disease)     Minimal nonobstructive CAD at catheterization 2009  . Chronic systolic heart failure   . LBBB (left bundle branch block)   . Secondary cardiomyopathy     Nonischemic  . Type 2 diabetes mellitus   . Atrial fibrillation     Declines anticoagulation  . Myocardial infarction   . CHF (congestive heart failure)   . Automatic implantable cardioverter-defibrillator in situ     Dr. Hillery JacksAllread  . Hypertension   . Hyperlipidemia    Past Surgical History  Procedure Laterality Date  . Hernia repair    . Fiscula repair    . Hearing surgery    . Cholecystectomy    . Cardiac defibrillator placement  12/14/2007    St. Jude Promote RF 3207 ICD placed by Dr. Graciela HusbandsKlein for CHF, nonischemic cardiomyopathy  . Cataract extraction w/phaco Right 06/14/2013    Procedure: CATARACT EXTRACTION PHACO AND INTRAOCULAR LENS PLACEMENT (IOC);  Surgeon: Gemma PayorKerry Hunt, MD;  Location: [facility];  Service: Ophthalmology;  Laterality: Right;  CDE:  21.74  . Cataract extraction w/phaco Left 07/08/2013    Procedure: CATARACT EXTRACTION PHACO AND INTRAOCULAR LENS PLACEMENT (IOC);  Surgeon: Gemma PayorKerry Hunt, MD;  Location: [facility];  Service: Ophthalmology;  Laterality: Left;  CDE:24.66    Current Outpatient Prescriptions  Medication Sig Dispense Refill  . aspirin 325 MG EC tablet Take 325 mg by mouth every morning. Takes after breakfast      . aspirin 81 MG tablet  Take 81 mg by mouth at bedtime.       . carvedilol (COREG) 25 MG tablet Take 1 tablet (25 mg total) by mouth 2 (two) times daily with a meal.  180 tablet  3  . cholecalciferol (VITAMIN D) 1000 UNITS tablet Take 1,000 Units by mouth daily.      . Cinnamon 500 MG TABS Take 1 tablet by mouth daily.       . digoxin (LANOXIN) 0.125 MG tablet Take 1 tablet (125 mcg total) by mouth daily.  90 tablet  3  . Echinacea 400 MG CAPS Take 1 capsule by mouth every morning.      . furosemide (LASIX) 40 MG tablet Take 0.5 tablets (20 mg total) by mouth 2 (two) times daily.  90 tablet  3  . glipiZIDE (GLUCOTROL XL) 10 MG 24 hr tablet Take 10 mg by mouth 2 (two) times daily.      Marland Kitchen. lisinopril (PRINIVIL,ZESTRIL) 10 MG tablet Take 1 tablet (10 mg total) by mouth daily.  90 tablet  3  . niacin 500 MG tablet Take 500 mg by mouth at bedtime.       . simvastatin (ZOCOR) 40 MG tablet Take 1 tablet (40 mg total) by mouth at bedtime.  90 tablet  3  . vitamin B-12 (CYANOCOBALAMIN) 500 MCG tablet Take 1 tablet by mouth daily.        No current facility-administered medications for this visit.    Allergies  Allergen Reactions  .  Penicillins Hives and Rash    History   Social History  . Marital Status: Married    Spouse Name: N/A    Number of Children: N/A  . Years of Education: N/A   Occupational History  . Retired    Social History Main Topics  . Smoking status: Former Smoker -- 0.30 packs/day for 5 years    Types: Cigarettes    Quit date: 08/05/1965  . Smokeless tobacco: Never Used  . Alcohol Use: No  . Drug Use: No  . Sexual Activity: Yes    Birth Control/ Protection: None   Other Topics Concern  . Not on file   Social History Narrative  . No narrative on file    Family History  Problem Relation Age of Onset  . Cancer    . Coronary artery disease    . Diabetes        Physical Exam: Filed Vitals:   10/27/13 1535  BP: 116/72  Pulse: 76  Height: 6' (1.829 m)  Weight: 211 lb 1.9 oz  (95.763 kg)  SpO2: 95%    GEN- The patient is well appearing, alert and oriented x 3 today.   Head- normocephalic, atraumatic Eyes-  Sclera clear, conjunctiva pink Ears- hearing intact Oropharynx- clear Neck- supple, no JVP Lymph- no cervical lymphadenopathy Lungs- Clear to ausculation bilaterally, normal work of breathing Chest- ICD pocket is well healed Heart- Regular rate and rhythm, no murmurs, rubs or gallops, PMI not laterally displaced GI- soft, NT, ND, + BS Extremities- no clubbing, cyanosis, or edema Neuro- strength and sensation are intact  ICD interrogation- reviewed in detail today,  See PACEART report  Assessment and Plan:  1. Chronic systolic dysfunction BiV ICD has reached ERI battery status See Arita Miss Art report No changes today Risks, benefits, and alternatives to BiV ICD generator change were discussed in detail with the patient and his spouse today.  They understand risks and wish to proceed.  We will schedule the procedure at then next available time.  I will obtain an echo prior to generator change to follow-up on his EF.  2. afib No recent afib by device interrogation He remains clear in his decision to decline anticoagulation therapy  3. DM We had a long discussion today regarding the importance of appropriate follow-up and compliance with Dr Neita Carp for diabetes management.  He continues to have poor insight and is resistant to compliance with therapies.

## 2013-11-23 NOTE — Discharge Instructions (Signed)
Please keep pressure dressing in place for 48 hours and then remove.  Keep steristrips in place and do not remove until you return to the device clinic for your wound check.  Do not allow incision to get wet for 10 days. Pacemaker Battery Change A pacemaker battery usually lasts 4 to 12 years. Once or twice per year, you will be asked to visit your health care provider to have a full evaluation of your pacemaker. When a battery needs to be replaced, the entire pacemaker is replaced so that you can benefit from new circuitry and any new features that have been added to pacemakers. Most often, this procedure is very simple because the leads are already in place.  There are many things that affect how long a pacemaker battery will last, including:   The age of the pacemaker.   The number of leads (1, 2, or 3).   The pacemaker work load. If the pacemaker is helping the heart more often, the battery will not last as long as it would if the pacemaker did not need to help the heart.   Power (voltage) settings. LET [facility] CARE PROVIDER KNOW ABOUT:   Any allergies you have.   All medicines you are taking, including vitamins, herbs, eye drops, creams, and over-the-counter medicines.   Previous problems you or members of your family have had with the use of anesthetics.   Any blood disorders you have.   Previous surgeries you have had, especially since your last pacemaker placement.   Medical conditions you have.   Possible pregnancy, if applicable.  Symptoms of chest pain, trouble breathing, palpitations, lightheadedness, or feelings of an abnormal or irregular heartbeat. RISKS AND COMPLICATIONS  Generally, this is a safe procedure. However, as with any procedure, complications can occur. Possible complications include:   Bleeding.   Bruising of the skin around where the incision was made.   Pain at the incision site.   Pulling apart of the skin at the incision site.    Infection.   Allergic reaction to anesthetics or other medicines used during the procedure.  Diabetics may have a temporary increase in their blood sugar after any surgical procedure.  BEFORE THE PROCEDURE   Wash all of the skin around the area of the chest where the pacemaker is located.   Ask your health care provider for help with any medicine adjustments before the pacemaker is replaced.   Unless advised otherwise, do not eat or drink after midnight on the night before the procedure. You may drink water to take your medicine as you normally would or as directed. PROCEDURE   After giving medicine to numb the skin, your health care provider will make a cut to reopen the pocket holding the pacemaker.   The old pacemaker will be disconnected from its leads.   The leads will be tested.   If needed, the leads will be replaced. If the leads are functioning properly, the new pacemaker may be connected to the existing leads.  A heart monitor and the pacemaker programmer will be used to make sure that the new pacemaker is working properly.  The incision site is then closed. A dressing is placed over the pacemaker site. The dressing is removed 24 to 48 hours afterwards. AFTER THE PROCEDURE   You will be taken to a recovery area after the new pacemaker implant is completed. Your vital signs such as blood pressure, heart rate, breathing, and oxygen levels will be monitored.  Your health  care provider will tell you when you will need to next test your pacemaker or when to return to the office for follow-up for removal of stitches. Document Released: 10/30/2006 Document Revised: 03/24/2013 Document Reviewed: 02/03/2013 [facility] Patient Information 2014 [place].

## 2013-11-24 ENCOUNTER — Encounter (HOSPITAL_COMMUNITY): Payer: Self-pay | Admitting: *Deleted

## 2013-12-06 ENCOUNTER — Encounter: Payer: Medicare HMO | Admitting: Internal Medicine

## 2013-12-06 ENCOUNTER — Ambulatory Visit (INDEPENDENT_AMBULATORY_CARE_PROVIDER_SITE_OTHER): Payer: Medicare HMO | Admitting: *Deleted

## 2013-12-06 DIAGNOSIS — I428 Other cardiomyopathies: Secondary | ICD-10-CM

## 2013-12-06 DIAGNOSIS — I420 Dilated cardiomyopathy: Secondary | ICD-10-CM

## 2013-12-06 DIAGNOSIS — I4891 Unspecified atrial fibrillation: Secondary | ICD-10-CM

## 2013-12-06 LAB — MDC_IDC_ENUM_SESS_TYPE_INCLINIC
Battery Remaining Longevity: 69.6 mo
Brady Statistic RA Percent Paced: 20 %
Brady Statistic RV Percent Paced: 99.86 %
HIGH POWER IMPEDANCE MEASURED VALUE: 44.9952
Implantable Pulse Generator Serial Number: 7179731
Lead Channel Impedance Value: 400 Ohm
Lead Channel Impedance Value: 700 Ohm
Lead Channel Pacing Threshold Amplitude: 0.5 V
Lead Channel Pacing Threshold Amplitude: 0.75 V
Lead Channel Pacing Threshold Pulse Width: 0.5 ms
Lead Channel Sensing Intrinsic Amplitude: 12 mV
Lead Channel Sensing Intrinsic Amplitude: 4.8 mV
Lead Channel Setting Pacing Amplitude: 2.5 V
Lead Channel Setting Pacing Amplitude: 2.5 V
Lead Channel Setting Pacing Pulse Width: 0.5 ms
Lead Channel Setting Pacing Pulse Width: 1 ms
MDC IDC MSMT LEADCHNL LV PACING THRESHOLD AMPLITUDE: 1.5 V
MDC IDC MSMT LEADCHNL LV PACING THRESHOLD PULSEWIDTH: 1 ms
MDC IDC MSMT LEADCHNL RA IMPEDANCE VALUE: 437.5 Ohm
MDC IDC MSMT LEADCHNL RV PACING THRESHOLD PULSEWIDTH: 0.5 ms
MDC IDC SESS DTM: 20150504132244
MDC IDC SET LEADCHNL RA PACING AMPLITUDE: 2 V
MDC IDC SET LEADCHNL RV SENSING SENSITIVITY: 0.5 mV
MDC IDC SET ZONE DETECTION INTERVAL: 250 ms
MDC IDC SET ZONE DETECTION INTERVAL: 300 ms

## 2013-12-06 NOTE — Progress Notes (Signed)
Wound check ICD in clinic. Normal device function. No episodes recorded. No changes made. Site well healed with no redness or swelling. ROV in 3 mths w/JA in [place].

## 2013-12-24 ENCOUNTER — Encounter: Payer: Self-pay | Admitting: Internal Medicine

## 2014-03-17 ENCOUNTER — Other Ambulatory Visit: Payer: Self-pay | Admitting: Cardiology

## 2014-04-01 ENCOUNTER — Encounter: Payer: Self-pay | Admitting: Internal Medicine

## 2014-04-01 ENCOUNTER — Ambulatory Visit (INDEPENDENT_AMBULATORY_CARE_PROVIDER_SITE_OTHER): Payer: Commercial Managed Care - HMO | Admitting: Internal Medicine

## 2014-04-01 VITALS — BP 102/65 | HR 69 | Ht 72.0 in | Wt 210.0 lb

## 2014-04-01 DIAGNOSIS — I428 Other cardiomyopathies: Secondary | ICD-10-CM

## 2014-04-01 DIAGNOSIS — I4891 Unspecified atrial fibrillation: Secondary | ICD-10-CM

## 2014-04-01 DIAGNOSIS — I5022 Chronic systolic (congestive) heart failure: Secondary | ICD-10-CM

## 2014-04-01 DIAGNOSIS — I480 Paroxysmal atrial fibrillation: Secondary | ICD-10-CM

## 2014-04-01 DIAGNOSIS — Z95810 Presence of automatic (implantable) cardiac defibrillator: Secondary | ICD-10-CM

## 2014-04-01 DIAGNOSIS — I420 Dilated cardiomyopathy: Secondary | ICD-10-CM

## 2014-04-01 LAB — MDC_IDC_ENUM_SESS_TYPE_INCLINIC
Brady Statistic RA Percent Paced: 18 %
Brady Statistic RV Percent Paced: 99.86 %
Date Time Interrogation Session: 20150828140446
HIGH POWER IMPEDANCE MEASURED VALUE: 47.6679
Implantable Pulse Generator Serial Number: 7179731
Lead Channel Impedance Value: 637.5 Ohm
Lead Channel Pacing Threshold Amplitude: 0.5 V
Lead Channel Pacing Threshold Amplitude: 0.75 V
Lead Channel Pacing Threshold Amplitude: 1.25 V
Lead Channel Pacing Threshold Pulse Width: 0.5 ms
Lead Channel Pacing Threshold Pulse Width: 0.5 ms
Lead Channel Pacing Threshold Pulse Width: 1 ms
Lead Channel Setting Pacing Amplitude: 2.5 V
Lead Channel Setting Pacing Pulse Width: 0.5 ms
Lead Channel Setting Pacing Pulse Width: 1 ms
MDC IDC MSMT BATTERY REMAINING LONGEVITY: 64.8 mo
MDC IDC MSMT LEADCHNL LV IMPEDANCE VALUE: 325 Ohm
MDC IDC MSMT LEADCHNL RA IMPEDANCE VALUE: 425 Ohm
MDC IDC MSMT LEADCHNL RA SENSING INTR AMPL: 4.1 mV
MDC IDC MSMT LEADCHNL RV SENSING INTR AMPL: 12 mV
MDC IDC SET LEADCHNL LV PACING AMPLITUDE: 2.25 V
MDC IDC SET LEADCHNL RA PACING AMPLITUDE: 2 V
MDC IDC SET LEADCHNL RV SENSING SENSITIVITY: 0.5 mV
Zone Setting Detection Interval: 250 ms
Zone Setting Detection Interval: 300 ms

## 2014-04-01 NOTE — Progress Notes (Signed)
PCP:  Alan Pandy, MD Primary Cardiologist:  [name]  The patient presents today for routine electrophysiology followup. Since his generator change, he has done well.  His device pocket has healed nicely. Today, he denies symptoms of palpitations, chest pain, shortness of breath, orthopnea, PND, lower extremity edema, dizziness, presyncope, syncope, or neurologic sequela.  The patient feels that he is tolerating medications without difficulties and is otherwise without complaint today.   Past Medical History  Diagnosis Date  . CAD (coronary artery disease)     Minimal nonobstructive CAD at catheterization 2009  . Chronic systolic heart failure   . LBBB (left bundle branch block)   . Secondary cardiomyopathy     Nonischemic  . Type 2 diabetes mellitus   . Atrial fibrillation     Declines anticoagulation  . Myocardial infarction   . CHF (congestive heart failure)   . Hypertension   . Hyperlipidemia    Past Surgical History  Procedure Laterality Date  . Hernia repair    . Fiscula repair    . Hearing surgery    . Cholecystectomy    . Cardiac defibrillator placement  12/14/2007; 11/23/13    St. Jude Promote RF 3207 ICD placed by Dr. Graciela Husbands for CHF, nonischemic cardiomyopathy; gen change 11/2013 by Dr Johney Frame STJ  . Cataract extraction w/phaco Right 06/14/2013    Procedure: CATARACT EXTRACTION PHACO AND INTRAOCULAR LENS PLACEMENT (IOC);  Surgeon: Gemma Payor, MD;  Location: [facility];  Service: Ophthalmology;  Laterality: Right;  CDE:  21.74  . Cataract extraction w/phaco Left 07/08/2013    Procedure: CATARACT EXTRACTION PHACO AND INTRAOCULAR LENS PLACEMENT (IOC);  Surgeon: Gemma Payor, MD;  Location: [facility];  Service: Ophthalmology;  Laterality: Left;  CDE:24.66    Current Outpatient Prescriptions  Medication Sig Dispense Refill  . aspirin 325 MG EC tablet Take 325 mg by mouth every morning. Takes after breakfast      . carvedilol (COREG) 25 MG tablet TAKE 1 TABLET TWICE DAILY  WITH  MEALS   180 tablet  3  . cholecalciferol (VITAMIN D) 1000 UNITS tablet Take 1,000 Units by mouth daily.      . Cinnamon 500 MG TABS Take 1 tablet by mouth daily.       . digoxin (LANOXIN) 0.125 MG tablet TAKE 1 TABLET EVERY DAY  90 tablet  3  . Echinacea 400 MG CAPS Take 1 capsule by mouth every morning.      . furosemide (LASIX) 40 MG tablet TAKE 1/2 TABLET TWICE DAILY  90 tablet  3  . glipiZIDE (GLUCOTROL XL) 10 MG 24 hr tablet Take 10 mg by mouth 2 (two) times daily.      Marland Kitchen lisinopril (PRINIVIL,ZESTRIL) 10 MG tablet TAKE 1 TABLET EVERY DAY  90 tablet  3  . simvastatin (ZOCOR) 40 MG tablet TAKE 1 TABLET AT BEDTIME  90 tablet  3  . vitamin B-12 (CYANOCOBALAMIN) 500 MCG tablet Take 1 tablet by mouth daily.        No current facility-administered medications for this visit.    Allergies  Allergen Reactions  . Penicillins Hives and Rash    History   Social History  . Marital Status: Married    Spouse Name: N/A    Number of Children: N/A  . Years of Education: N/A   Occupational History  . Retired    Social History Main Topics  . Smoking status: Former Smoker -- 0.30 packs/day for 5 years    Types: Cigarettes, Cigars  Start date: 02/15/1959    Quit date: 08/05/1965  . Smokeless tobacco: Never Used  . Alcohol Use: No  . Drug Use: No  . Sexual Activity: Yes    Birth Control/ Protection: None   Other Topics Concern  . Not on file   Social History Narrative  . No narrative on file    Family History  Problem Relation Age of Onset  . Cancer    . Coronary artery disease    . Diabetes        Physical Exam: Filed Vitals:   04/01/14 1109  BP: 102/65  Pulse: 69  Height: 6' (1.829 m)  Weight: 210 lb (95.255 kg)    GEN- The patient is well appearing, alert and oriented x 3 today.   Head- normocephalic, atraumatic Eyes-  Sclera clear, conjunctiva pink Ears- hearing intact Oropharynx- clear Neck- supple,  Lungs- Clear to ausculation bilaterally, normal work of  breathing Chest- ICD pocket is well healed Heart- Regular rate and rhythm, no murmurs, rubs or gallops, PMI not laterally displaced GI- soft, NT, ND, + BS Extremities- no clubbing, cyanosis, or edema Neuro- strength and sensation are intact  ICD interrogation- reviewed in detail today,  See PACEART report ekg today reveals sinus rhythm with biv pacing  Assessment and Plan:  1. Chronic systolic dysfunction Normal BiV ICD function See Pace Art report No changes today Continue remote follow-up  2. afib No recent afib by device interrogation He remains clear in his decision to decline anticoagulation therapy  Merlin He is clear that he is not interested in the [facility] device clinic for coreview management

## 2014-04-01 NOTE — Patient Instructions (Signed)
Your physician recommends that you schedule a follow-up appointment in: 1 year. You will receive a reminder letter in the mail in about 10 months reminding you to call and schedule your appointment. If you don't receive this letter, please contact our office. Merlin/device check 07/04/14. Your physician recommends that you continue on your current medications as directed. Please refer to the Current Medication list given to you today.

## 2014-04-08 ENCOUNTER — Encounter: Payer: Self-pay | Admitting: Internal Medicine

## 2014-05-06 ENCOUNTER — Other Ambulatory Visit: Payer: Self-pay | Admitting: *Deleted

## 2014-06-06 ENCOUNTER — Encounter: Payer: Self-pay | Admitting: Cardiology

## 2014-06-06 ENCOUNTER — Ambulatory Visit (INDEPENDENT_AMBULATORY_CARE_PROVIDER_SITE_OTHER): Payer: Commercial Managed Care - HMO | Admitting: Cardiology

## 2014-06-06 VITALS — BP 108/86 | HR 85 | Ht 72.0 in | Wt 212.4 lb

## 2014-06-06 DIAGNOSIS — I480 Paroxysmal atrial fibrillation: Secondary | ICD-10-CM

## 2014-06-06 DIAGNOSIS — I429 Cardiomyopathy, unspecified: Secondary | ICD-10-CM

## 2014-06-06 DIAGNOSIS — I2510 Atherosclerotic heart disease of native coronary artery without angina pectoris: Secondary | ICD-10-CM

## 2014-06-06 DIAGNOSIS — I420 Dilated cardiomyopathy: Secondary | ICD-10-CM

## 2014-06-06 NOTE — Patient Instructions (Signed)

## 2014-06-06 NOTE — Assessment & Plan Note (Signed)
Can follow-up through device interrogation, patient is asymptomatic. He continues on carvedilol and digoxin. He has consistently declined anticoagulation, continues on aspirin.

## 2014-06-06 NOTE — Assessment & Plan Note (Signed)
LVEF 35-40% by echocardiogram earlier in the year. Continue medical therapy, patient also status post ICD.

## 2014-06-06 NOTE — Progress Notes (Signed)
Reason for visit: Cardiomyopathy, atrial fibrillation  Clinical Summary [name] is a 68 y.o.male last seen in November 2014. Interval follow-up with Dr. Johney Schwartz noted. He presents describing no progressive shortness of breath or chest pain. He does not exercise regularly at this time, although mentions that he has a stationary bicycle that he used to use twice a day. He reports compliance with his medications. Device follow-up has been per remote, with follow-up anticipated later this month.  Weight is up only a few pounds since earlier in the year. He reports no orthopnea or PND. No dizziness or syncope.  Echocardiogram from April 2015 revealed mild LVH with LVEF 35-40%, diffuse hypokinesis, grade 1 diastolic dysfunction, mild aortic regurgitation, mild mitral regurgitation, mild right ventricular enlargement, device wire present, mild tricuspid regurgitation.  Allergies  Allergen Reactions  . Penicillins Hives and Rash    Current Outpatient Prescriptions  Medication Sig Dispense Refill  . aspirin 325 MG EC tablet Take 325 mg by mouth every morning. Takes after breakfast    . carvedilol (COREG) 25 MG tablet TAKE 1 TABLET TWICE DAILY  WITH  MEALS 180 tablet 3  . cholecalciferol (VITAMIN D) 1000 UNITS tablet Take 1,000 Units by mouth daily.    . Cinnamon 500 MG TABS Take 1 tablet by mouth daily.     . digoxin (LANOXIN) 0.125 MG tablet TAKE 1 TABLET EVERY DAY 90 tablet 3  . Echinacea 400 MG CAPS Take 1 capsule by mouth every morning.    . furosemide (LASIX) 40 MG tablet TAKE 1/2 TABLET TWICE DAILY 90 tablet 3  . glipiZIDE (GLUCOTROL XL) 10 MG 24 hr tablet Take 10 mg by mouth 2 (two) times daily.    Marland Kitchen. lisinopril (PRINIVIL,ZESTRIL) 10 MG tablet TAKE 1 TABLET EVERY DAY 90 tablet 3  . simvastatin (ZOCOR) 40 MG tablet TAKE 1 TABLET AT BEDTIME 90 tablet 3  . vitamin B-12 (CYANOCOBALAMIN) 500 MCG tablet Take 1 tablet by mouth daily.      No current facility-administered medications for  this visit.    Past Medical History  Diagnosis Date  . CAD (coronary artery disease)     Minimal nonobstructive CAD at catheterization 2009  . Chronic systolic heart failure   . LBBB (left bundle branch block)   . Secondary cardiomyopathy     Nonischemic  . Type 2 diabetes mellitus   . Atrial fibrillation     Declines anticoagulation  . Myocardial infarction   . Essential hypertension   . Hyperlipidemia     Past Surgical History  Procedure Laterality Date  . Hernia repair    . Fiscula repair    . Hearing surgery    . Cholecystectomy    . Cardiac defibrillator placement  12/14/2007; 11/23/13    St. Jude Promote RF 3207 ICD placed by Dr. Graciela Schwartz for CHF, nonischemic cardiomyopathy; gen change 11/2013 by Dr [name] STJ  . Cataract extraction w/phaco Right 06/14/2013    Procedure: CATARACT EXTRACTION PHACO AND INTRAOCULAR LENS PLACEMENT (IOC);  Surgeon: Alan PayorKerry Hunt, MD;  Location: [facility];  Service: Ophthalmology;  Laterality: Right;  CDE:  21.74  . Cataract extraction w/phaco Left 07/08/2013    Procedure: CATARACT EXTRACTION PHACO AND INTRAOCULAR LENS PLACEMENT (IOC);  Surgeon: Alan PayorKerry Hunt, MD;  Location: [facility];  Service: Ophthalmology;  Laterality: Left;  CDE:24.66    Social History [name] reports that he quit smoking about 48 years ago. His smoking use included Cigarettes and Cigars. He started smoking about 55 years ago. He  has a 1.5 pack-year smoking history. He has never used smokeless tobacco. [name] reports that he does not drink alcohol.  Review of Systems Complete review of systems negative except as otherwise outlined in the clinical summary.  Physical Examination Filed Vitals:   06/06/14 1327  BP: 108/86  Pulse: 85   Filed Weights   06/06/14 1300  Weight: 212 lb 6.4 oz (96.344 kg)    Patient in no acute distress.  HEENT: Conjunctiva and lids normal, oropharynx clear.  Neck: Supple, no elevated JVP or carotid bruits, no thyromegaly.  Lungs: Clear to  auscultation, nonlabored breathing at rest.  Cardiac: Regular rate and rhythm, no S3 or significant systolic murmur, no pericardial rub.  Thorax: Stable device pocket site.  Abdomen: Soft, nontender, bowel sounds present.  Extremities: No pitting edema, distal pulses 1-2+.  Skin: Warm and dry.  Musculoskeletal: No kyphosis.  Neuropsychiatric: Alert and oriented x3, affect grossly appropriate.   Problem List and Plan   ATRIAL FIBRILLATION, PAROXYSMAL Can follow-up through device interrogation, patient is asymptomatic. He continues on carvedilol and digoxin. He has consistently declined anticoagulation, continues on aspirin.  Nonischemic dilated cardiomyopathy LVEF 35-40% by echocardiogram earlier in the year. Continue medical therapy, patient also status post ICD.  CAD, NATIVE VESSEL No active angina symptoms with history of minimal coronary atherosclerosis documented at cardiac catheterization in 2009. He is on aspirin and statin therapy.    Jonelle Sidle, M.D., F.A.C.C.

## 2014-06-06 NOTE — Assessment & Plan Note (Signed)
No active angina symptoms with history of minimal coronary atherosclerosis documented at cardiac catheterization in 2009. He is on aspirin and statin therapy.

## 2014-07-04 ENCOUNTER — Encounter: Payer: Self-pay | Admitting: Internal Medicine

## 2014-07-04 ENCOUNTER — Ambulatory Visit (INDEPENDENT_AMBULATORY_CARE_PROVIDER_SITE_OTHER): Payer: Commercial Managed Care - HMO | Admitting: *Deleted

## 2014-07-04 DIAGNOSIS — I429 Cardiomyopathy, unspecified: Secondary | ICD-10-CM

## 2014-07-04 DIAGNOSIS — I420 Dilated cardiomyopathy: Secondary | ICD-10-CM

## 2014-07-04 LAB — MDC_IDC_ENUM_SESS_TYPE_REMOTE
Battery Remaining Percentage: 90 %
Battery Voltage: 3.1 V
Brady Statistic AP VP Percent: 22 %
Brady Statistic AP VS Percent: 1 %
Brady Statistic AS VP Percent: 78 %
Brady Statistic AS VS Percent: 1 %
Brady Statistic RA Percent Paced: 22 %
Date Time Interrogation Session: 20151130155500
HIGH POWER IMPEDANCE MEASURED VALUE: 48 Ohm
HighPow Impedance: 46 Ohm
Lead Channel Impedance Value: 410 Ohm
Lead Channel Impedance Value: 680 Ohm
Lead Channel Pacing Threshold Amplitude: 0.5 V
Lead Channel Pacing Threshold Amplitude: 1.25 V
Lead Channel Pacing Threshold Pulse Width: 0.5 ms
Lead Channel Pacing Threshold Pulse Width: 1 ms
Lead Channel Sensing Intrinsic Amplitude: 12 mV
Lead Channel Setting Pacing Amplitude: 2 V
Lead Channel Setting Pacing Amplitude: 2.25 V
Lead Channel Setting Pacing Pulse Width: 1 ms
Lead Channel Setting Sensing Sensitivity: 0.5 mV
MDC IDC MSMT BATTERY REMAINING LONGEVITY: 65 mo
MDC IDC MSMT LEADCHNL RA IMPEDANCE VALUE: 410 Ohm
MDC IDC MSMT LEADCHNL RA PACING THRESHOLD AMPLITUDE: 0.75 V
MDC IDC MSMT LEADCHNL RA PACING THRESHOLD PULSEWIDTH: 0.5 ms
MDC IDC MSMT LEADCHNL RA SENSING INTR AMPL: 4.3 mV
MDC IDC PG SERIAL: 7179731
MDC IDC SET LEADCHNL RV PACING AMPLITUDE: 2.5 V
MDC IDC SET LEADCHNL RV PACING PULSEWIDTH: 0.5 ms
MDC IDC SET ZONE DETECTION INTERVAL: 250 ms
Zone Setting Detection Interval: 300 ms

## 2014-07-05 NOTE — Progress Notes (Signed)
Remote ICD transmission.   

## 2014-07-12 ENCOUNTER — Encounter: Payer: Self-pay | Admitting: Cardiology

## 2014-07-14 ENCOUNTER — Encounter (HOSPITAL_COMMUNITY): Payer: Self-pay | Admitting: Internal Medicine

## 2014-10-03 ENCOUNTER — Ambulatory Visit (INDEPENDENT_AMBULATORY_CARE_PROVIDER_SITE_OTHER): Payer: Commercial Managed Care - HMO | Admitting: *Deleted

## 2014-10-03 DIAGNOSIS — I429 Cardiomyopathy, unspecified: Secondary | ICD-10-CM | POA: Diagnosis not present

## 2014-10-03 DIAGNOSIS — I420 Dilated cardiomyopathy: Secondary | ICD-10-CM

## 2014-10-03 DIAGNOSIS — I5022 Chronic systolic (congestive) heart failure: Secondary | ICD-10-CM

## 2014-10-03 LAB — MDC_IDC_ENUM_SESS_TYPE_REMOTE
Battery Remaining Longevity: 65 mo
Battery Remaining Percentage: 86 %
Brady Statistic AP VP Percent: 20 %
Brady Statistic AS VP Percent: 80 %
Brady Statistic RA Percent Paced: 20 %
HIGH POWER IMPEDANCE MEASURED VALUE: 50 Ohm
HighPow Impedance: 50 Ohm
Implantable Pulse Generator Serial Number: 7179731
Lead Channel Impedance Value: 410 Ohm
Lead Channel Pacing Threshold Amplitude: 0.5 V
Lead Channel Pacing Threshold Amplitude: 0.75 V
Lead Channel Pacing Threshold Pulse Width: 0.5 ms
Lead Channel Pacing Threshold Pulse Width: 0.5 ms
Lead Channel Sensing Intrinsic Amplitude: 12 mV
Lead Channel Sensing Intrinsic Amplitude: 4.9 mV
Lead Channel Setting Pacing Amplitude: 2.25 V
Lead Channel Setting Pacing Pulse Width: 1 ms
Lead Channel Setting Sensing Sensitivity: 0.5 mV
MDC IDC MSMT BATTERY VOLTAGE: 3.04 V
MDC IDC MSMT LEADCHNL LV PACING THRESHOLD AMPLITUDE: 1.25 V
MDC IDC MSMT LEADCHNL LV PACING THRESHOLD PULSEWIDTH: 1 ms
MDC IDC MSMT LEADCHNL RA IMPEDANCE VALUE: 430 Ohm
MDC IDC MSMT LEADCHNL RV IMPEDANCE VALUE: 700 Ohm
MDC IDC SESS DTM: 20160229070016
MDC IDC SET LEADCHNL RA PACING AMPLITUDE: 2 V
MDC IDC SET LEADCHNL RV PACING AMPLITUDE: 2.5 V
MDC IDC SET LEADCHNL RV PACING PULSEWIDTH: 0.5 ms
MDC IDC SET ZONE DETECTION INTERVAL: 300 ms
MDC IDC STAT BRADY AP VS PERCENT: 1 %
MDC IDC STAT BRADY AS VS PERCENT: 1 %
Zone Setting Detection Interval: 250 ms

## 2014-10-03 NOTE — Progress Notes (Signed)
Remote ICD transmission.   

## 2014-10-12 ENCOUNTER — Telehealth: Payer: Self-pay | Admitting: Cardiology

## 2014-10-12 NOTE — Telephone Encounter (Signed)
LMOVM for pt to return call in regards to ICM clinic.  

## 2014-10-19 ENCOUNTER — Encounter: Payer: Self-pay | Admitting: Internal Medicine

## 2014-10-21 ENCOUNTER — Telehealth: Payer: Self-pay | Admitting: Cardiology

## 2014-10-21 NOTE — Telephone Encounter (Signed)
Pt states that he feels like he has too much going on right now and does not want to enroll in ICM clinic at this time.

## 2015-01-03 ENCOUNTER — Ambulatory Visit (INDEPENDENT_AMBULATORY_CARE_PROVIDER_SITE_OTHER): Payer: Commercial Managed Care - HMO | Admitting: *Deleted

## 2015-01-03 DIAGNOSIS — I429 Cardiomyopathy, unspecified: Secondary | ICD-10-CM | POA: Diagnosis not present

## 2015-01-03 DIAGNOSIS — I5022 Chronic systolic (congestive) heart failure: Secondary | ICD-10-CM

## 2015-01-03 DIAGNOSIS — I420 Dilated cardiomyopathy: Secondary | ICD-10-CM

## 2015-01-03 NOTE — Progress Notes (Signed)
Remote ICD transmission.   

## 2015-01-08 LAB — CUP PACEART REMOTE DEVICE CHECK
Battery Remaining Longevity: 63 mo
Battery Remaining Percentage: 83 %
Brady Statistic AP VP Percent: 19 %
Brady Statistic AS VP Percent: 81 %
Date Time Interrogation Session: 20160531060016
HIGH POWER IMPEDANCE MEASURED VALUE: 47 Ohm
HIGH POWER IMPEDANCE MEASURED VALUE: 47 Ohm
Lead Channel Impedance Value: 680 Ohm
Lead Channel Pacing Threshold Amplitude: 0.5 V
Lead Channel Pacing Threshold Pulse Width: 0.5 ms
Lead Channel Sensing Intrinsic Amplitude: 5 mV
Lead Channel Setting Pacing Amplitude: 2 V
Lead Channel Setting Pacing Amplitude: 2.25 V
Lead Channel Setting Pacing Pulse Width: 1 ms
Lead Channel Setting Sensing Sensitivity: 0.5 mV
MDC IDC MSMT BATTERY VOLTAGE: 2.99 V
MDC IDC MSMT LEADCHNL LV IMPEDANCE VALUE: 430 Ohm
MDC IDC MSMT LEADCHNL LV PACING THRESHOLD AMPLITUDE: 1.25 V
MDC IDC MSMT LEADCHNL LV PACING THRESHOLD PULSEWIDTH: 1 ms
MDC IDC MSMT LEADCHNL RA IMPEDANCE VALUE: 440 Ohm
MDC IDC MSMT LEADCHNL RA PACING THRESHOLD AMPLITUDE: 0.75 V
MDC IDC MSMT LEADCHNL RV PACING THRESHOLD PULSEWIDTH: 0.5 ms
MDC IDC MSMT LEADCHNL RV SENSING INTR AMPL: 12 mV
MDC IDC SET LEADCHNL RV PACING AMPLITUDE: 2.5 V
MDC IDC SET LEADCHNL RV PACING PULSEWIDTH: 0.5 ms
MDC IDC SET ZONE DETECTION INTERVAL: 250 ms
MDC IDC STAT BRADY AP VS PERCENT: 1 %
MDC IDC STAT BRADY AS VS PERCENT: 1 %
MDC IDC STAT BRADY RA PERCENT PACED: 19 %
Pulse Gen Serial Number: 7179731
Zone Setting Detection Interval: 300 ms

## 2015-01-23 ENCOUNTER — Encounter: Payer: Self-pay | Admitting: Cardiology

## 2015-01-25 ENCOUNTER — Encounter: Payer: Self-pay | Admitting: Internal Medicine

## 2015-02-27 ENCOUNTER — Other Ambulatory Visit: Payer: Self-pay | Admitting: Cardiology

## 2015-04-14 ENCOUNTER — Encounter: Payer: Self-pay | Admitting: *Deleted

## 2015-05-03 ENCOUNTER — Encounter: Payer: Self-pay | Admitting: *Deleted

## 2015-05-10 ENCOUNTER — Encounter: Payer: Self-pay | Admitting: Internal Medicine

## 2015-05-10 ENCOUNTER — Ambulatory Visit (INDEPENDENT_AMBULATORY_CARE_PROVIDER_SITE_OTHER): Payer: Commercial Managed Care - HMO | Admitting: *Deleted

## 2015-05-10 DIAGNOSIS — I420 Dilated cardiomyopathy: Secondary | ICD-10-CM

## 2015-05-10 DIAGNOSIS — I5022 Chronic systolic (congestive) heart failure: Secondary | ICD-10-CM

## 2015-05-10 DIAGNOSIS — I429 Cardiomyopathy, unspecified: Secondary | ICD-10-CM | POA: Diagnosis not present

## 2015-05-11 ENCOUNTER — Other Ambulatory Visit: Payer: Self-pay | Admitting: *Deleted

## 2015-05-11 NOTE — Patient Outreach (Signed)
[facility]) Care Management  05/11/2015  NABIL CURLESS 1946-02-22 [id number]   Referral from MD, assigned Verdie Drown, RN to outreach for [facility] Care Management services.  Thanks, [name]. [name] [facility] Care Management [facility] CM Assistant Phone: [phone] Fax: [phone]

## 2015-05-11 NOTE — Patient Outreach (Signed)
  Received referral from Dr. Neita Carp office. Call to patient to discuss referral and [facility] program services. Patient states he is not interested in moving forward.  He states he will tell the doctor if he needs something. Patient refused to discuss [facility] program and refused to receive any information via mail. Plan to let CMA know of refusal and let Primary care Doctor know that patient does not want to participate with [facility] At this time. [name]. [name], RN, BSN, [facility] Liaison [phone]

## 2015-05-12 NOTE — Progress Notes (Signed)
Remote ICD transmission.   

## 2015-05-15 NOTE — Patient Outreach (Signed)
[facility]) Care Management  05/15/2015  [name] 09/05/1945 [id number]   Notification from Fernand Parkins, RN to close case due to patient refused [facility] Care Management services.  Thanks, [name]. [name] [facility] Care Management [facility] CM Assistant Phone: [phone] Fax: [phone]

## 2015-05-18 DIAGNOSIS — Z23 Encounter for immunization: Secondary | ICD-10-CM | POA: Diagnosis not present

## 2015-05-29 LAB — CUP PACEART REMOTE DEVICE CHECK
Battery Remaining Longevity: 60 mo
Battery Remaining Percentage: 77 %
Date Time Interrogation Session: 20161024100855
HighPow Impedance: 48 Ohm
Implantable Lead Implant Date: 20090511
Implantable Lead Implant Date: 20090511
Implantable Lead Location: 753860
Implantable Lead Model: 4543
Implantable Lead Model: 7121
Implantable Lead Serial Number: 141404
Lead Channel Impedance Value: 390 Ohm
Lead Channel Impedance Value: 430 Ohm
Lead Channel Sensing Intrinsic Amplitude: 11.9 mV
Lead Channel Setting Pacing Amplitude: 2 V
Lead Channel Setting Pacing Amplitude: 2.25 V
Lead Channel Setting Pacing Pulse Width: 0.5 ms
Lead Channel Setting Pacing Pulse Width: 1 ms
MDC IDC LEAD IMPLANT DT: 20090511
MDC IDC LEAD LOCATION: 753858
MDC IDC LEAD LOCATION: 753859
MDC IDC MSMT LEADCHNL RA SENSING INTR AMPL: 4.3 mV
MDC IDC MSMT LEADCHNL RV IMPEDANCE VALUE: 680 Ohm
MDC IDC SET LEADCHNL RV PACING AMPLITUDE: 2.5 V
MDC IDC SET LEADCHNL RV SENSING SENSITIVITY: 0.5 mV
MDC IDC STAT BRADY RA PERCENT PACED: 19 %
MDC IDC STAT BRADY RV PERCENT PACED: 99 % — AB
Pulse Gen Serial Number: 7179731

## 2015-06-06 ENCOUNTER — Ambulatory Visit (INDEPENDENT_AMBULATORY_CARE_PROVIDER_SITE_OTHER): Payer: Commercial Managed Care - HMO | Admitting: Cardiology

## 2015-06-06 ENCOUNTER — Encounter: Payer: Self-pay | Admitting: Cardiology

## 2015-06-06 VITALS — BP 107/69 | HR 77 | Ht 72.0 in | Wt 193.2 lb

## 2015-06-06 DIAGNOSIS — I10 Essential (primary) hypertension: Secondary | ICD-10-CM | POA: Diagnosis not present

## 2015-06-06 DIAGNOSIS — Z95810 Presence of automatic (implantable) cardiac defibrillator: Secondary | ICD-10-CM

## 2015-06-06 DIAGNOSIS — I480 Paroxysmal atrial fibrillation: Secondary | ICD-10-CM | POA: Diagnosis not present

## 2015-06-06 DIAGNOSIS — I429 Cardiomyopathy, unspecified: Secondary | ICD-10-CM | POA: Diagnosis not present

## 2015-06-06 DIAGNOSIS — I420 Dilated cardiomyopathy: Secondary | ICD-10-CM

## 2015-06-06 NOTE — Progress Notes (Signed)
Cardiology Office Note  Date: 06/06/2015   ID: MCCAULEY DIEHL, DOB 1945-09-28, MRN [id number]  PCP: [name], MD  Primary Cardiologist: Nona Dell, MD   Chief Complaint  Patient presents with  . Cardiomyopathy  . PAF    History of Present Illness: [name] is a 69 y.o. male last seen in November 2015. He presents today for a routine cardiac visit. He tells me that he has had a lack of energy. He reports compliance with his medications, although this has been a problem in the past. He does not exercise with any regularity, we discussed dietary modifications and also aerobic activity that may improve his energy level. Otherwise he has NYHA class II dyspnea with typical activities.  Device interrogation continues with Dr. Johney Frame. Most recent assessment showed only 3 mode switches, longest of which was only 18 seconds duration. No ventricular arrhythmias noted. Biventricular pacing greater than 99% of the time. He has declined anticoagulation for PAF long-term.  He follows with Dr. Neita Carp for management of type 2 diabetes mellitus. He describes symptoms consistent with peripheral neuropathy. His weight is down about 10 pounds from last year.  We discussed his cardiac medications which are outlined below. He is on a stable regimen, blood pressure and heart rate look good today. Follow-up ECG shows ventricular pacing with atrial sensing, 74 bpm.   Past Medical History  Diagnosis Date  . CAD (coronary artery disease)     Minimal nonobstructive CAD at catheterization 2009  . Chronic systolic heart failure (HCC)   . LBBB (left bundle branch block)   . Secondary cardiomyopathy (HCC)     Nonischemic  . Type 2 diabetes mellitus (HCC)   . Atrial fibrillation (HCC)     Declines anticoagulation  . Myocardial infarction (HCC)   . Essential hypertension   . Hyperlipidemia     Past Surgical History  Procedure Laterality Date  . Hernia repair    . Fiscula repair      . Hearing surgery    . Cholecystectomy    . Cardiac defibrillator placement  12/14/2007; 11/23/13    St. Jude Promote RF 3207 ICD placed by Dr. Graciela Husbands for CHF, nonischemic cardiomyopathy; gen change 11/2013 by Dr Johney Frame STJ  . Cataract extraction w/phaco Right 06/14/2013    Procedure: CATARACT EXTRACTION PHACO AND INTRAOCULAR LENS PLACEMENT (IOC);  Surgeon: Gemma Payor, MD;  Location: [facility];  Service: Ophthalmology;  Laterality: Right;  CDE:  21.74  . Cataract extraction w/phaco Left 07/08/2013    Procedure: CATARACT EXTRACTION PHACO AND INTRAOCULAR LENS PLACEMENT (IOC);  Surgeon: Gemma Payor, MD;  Location: [facility];  Service: Ophthalmology;  Laterality: Left;  CDE:24.66  . Implantable cardioverter defibrillator generator change N/A 11/23/2013    Procedure: IMPLANTABLE CARDIOVERTER DEFIBRILLATOR GENERATOR CHANGE;  Surgeon: Gardiner Rhyme, MD;  Location: [facility] CATH LAB;  Service: Cardiovascular;  Laterality: N/A;    Current Outpatient Prescriptions  Medication Sig Dispense Refill  . aspirin 325 MG EC tablet Take 325 mg by mouth every morning. Takes after breakfast    . carvedilol (COREG) 25 MG tablet TAKE 1 TABLET TWICE DAILY  WITH  MEALS 180 tablet 3  . cholecalciferol (VITAMIN D) 1000 UNITS tablet Take 1,000 Units by mouth daily.    . Cinnamon 500 MG TABS Take 1 tablet by mouth daily.     . Cyanocobalamin (VITAMIN B-12 CR PO) Take 1 tablet by mouth daily.    . digoxin (LANOXIN) 0.125 MG tablet TAKE 1 TABLET EVERY  DAY 90 tablet 3  . Echinacea 400 MG CAPS Take 1 capsule by mouth every morning.    . furosemide (LASIX) 40 MG tablet TAKE 1/2 TABLET TWICE DAILY 90 tablet 3  . glipiZIDE (GLUCOTROL XL) 10 MG 24 hr tablet Take 10 mg by mouth 2 (two) times daily.    Marland Kitchen lisinopril (PRINIVIL,ZESTRIL) 10 MG tablet TAKE 1 TABLET EVERY DAY 90 tablet 3  . simvastatin (ZOCOR) 40 MG tablet TAKE 1 TABLET AT BEDTIME 90 tablet 3   No current facility-administered medications for this visit.    Allergies:  Penicillins    Social History: The patient  reports that he quit smoking about 49 years ago. His smoking use included Cigarettes and Cigars. He started smoking about 56 years ago. He has a 1.5 pack-year smoking history. He has never used smokeless tobacco. He reports that he does not drink alcohol or use illicit drugs.   ROS:  Please see the history of present illness. Otherwise, complete review of systems is positive for tingling in his legs.  All other systems are reviewed and negative.   Physical Exam: VS:  BP 107/69 mmHg  Pulse 77  Ht 6' (1.829 m)  Wt 193 lb 3.2 oz (87.635 kg)  BMI 26.20 kg/m2  SpO2 98%, BMI Body mass index is 26.2 kg/(m^2).  Wt Readings from Last 3 Encounters:  06/06/15 193 lb 3.2 oz (87.635 kg)  06/06/14 212 lb 6.4 oz (96.344 kg)  04/01/14 210 lb (95.255 kg)     Appears comfortable at rest. HEENT: Conjunctiva and lids normal, oropharynx clear.  Neck: Supple, no elevated JVP or carotid bruits, no thyromegaly.  Lungs: Clear to auscultation, nonlabored breathing at rest.  Cardiac: Regular rate and rhythm, no S3 or significant systolic murmur, no pericardial rub.  Thorax: Stable device pocket site.  Abdomen: Soft, nontender, bowel sounds present.  Extremities: No pitting edema, distal pulses 1-2+.  Skin: Warm and dry.  Musculoskeletal: No kyphosis.  Neuropsychiatric: Alert and oriented x3, affect grossly appropriate.   ECG: ECG is ordered today.  Other Studies Reviewed Today:  Echocardiogram 11/18/2013: Study Conclusions  - Left ventricle: The cavity size was normal. Wall thickness was increased in a pattern of mild LVH. Systolic function was moderately reduced. The estimated ejection fraction was in the range of 35% to 40%. Diffuse hypokinesis. There was an increased relative contribution of atrial contraction to ventricular filling. Doppler parameters are consistent with abnormal left ventricular relaxation (grade 1 diastolic  dysfunction). - Aortic valve: Mildly calcified annulus. Trileaflet. Mild regurgitation. - Mitral valve: Mildly thickened leaflets . Mild regurgitation. - Right ventricle: The cavity size was mildly dilated. Pacer wire or catheter noted in right ventricle. - Right atrium: Pacer wire or catheter noted in right atrium. - Tricuspid valve: Mild regurgitation.  Assessment and Plan:  1. Nonischemic cardiomyopathy with LVEF 35-40%. I have recommended that he consider a regular exercise plan such as walking or using his stationary bicycle, at least 3 days a week to start. His cardiac medical regimen looks good, no changes otherwise made today.   2. St. Jude ICD in place, no device shocks. He continues to follow with Dr. Johney Frame.  3. Known history of PAF, does not look to have a significant arrhythmia burden by his last device interrogation. He has consistently declined anticoagulation.  4. Type 2 diabetes mellitus, sounds to be poorly controlled overall. He does not follow consistently with Dr. Neita Carp by his report today. States that he does not want to take insulin,  is on Glucotrol XL.  5. Essential hypertension, blood pressure is normal today.  Current medicines were reviewed with the patient today.   Orders Placed This Encounter  Procedures  . EKG 12-Lead    Disposition: FU with me in 1 year.   Signed, Jonelle Sidle, MD, [facility] 06/06/2015 3:57 PM    [facility] [address] [place], [place], [place] [address] Phone: [phone]; Fax: [phone]

## 2015-06-06 NOTE — Patient Instructions (Signed)

## 2015-06-15 ENCOUNTER — Encounter: Payer: Self-pay | Admitting: *Deleted

## 2015-09-08 ENCOUNTER — Ambulatory Visit (INDEPENDENT_AMBULATORY_CARE_PROVIDER_SITE_OTHER): Payer: Commercial Managed Care - HMO | Admitting: Internal Medicine

## 2015-09-08 ENCOUNTER — Encounter: Payer: Self-pay | Admitting: Internal Medicine

## 2015-09-08 ENCOUNTER — Telehealth: Payer: Self-pay | Admitting: *Deleted

## 2015-09-08 VITALS — BP 117/75 | HR 77 | Ht 72.0 in | Wt 154.0 lb

## 2015-09-08 DIAGNOSIS — Z95810 Presence of automatic (implantable) cardiac defibrillator: Secondary | ICD-10-CM | POA: Diagnosis not present

## 2015-09-08 DIAGNOSIS — I429 Cardiomyopathy, unspecified: Secondary | ICD-10-CM | POA: Diagnosis not present

## 2015-09-08 DIAGNOSIS — I420 Dilated cardiomyopathy: Secondary | ICD-10-CM

## 2015-09-08 LAB — CUP PACEART INCLINIC DEVICE CHECK
Battery Remaining Longevity: 55.2
HighPow Impedance: 48.0285
Implantable Lead Implant Date: 20090511
Implantable Lead Implant Date: 20090511
Implantable Lead Location: 753859
Implantable Lead Model: 7121
Implantable Lead Serial Number: 141404
Lead Channel Impedance Value: 387.5 Ohm
Lead Channel Impedance Value: 625 Ohm
Lead Channel Pacing Threshold Amplitude: 0.5 V
Lead Channel Pacing Threshold Amplitude: 0.5 V
Lead Channel Pacing Threshold Amplitude: 0.625 V
Lead Channel Pacing Threshold Amplitude: 0.75 V
Lead Channel Pacing Threshold Pulse Width: 0.5 ms
Lead Channel Pacing Threshold Pulse Width: 0.5 ms
Lead Channel Setting Pacing Amplitude: 1.5 V
Lead Channel Setting Pacing Amplitude: 2 V
Lead Channel Setting Pacing Pulse Width: 0.5 ms
Lead Channel Setting Pacing Pulse Width: 1 ms
Lead Channel Setting Sensing Sensitivity: 0.5 mV
MDC IDC LEAD IMPLANT DT: 20090511
MDC IDC LEAD LOCATION: 753858
MDC IDC LEAD LOCATION: 753860
MDC IDC MSMT LEADCHNL LV PACING THRESHOLD AMPLITUDE: 1 V
MDC IDC MSMT LEADCHNL LV PACING THRESHOLD PULSEWIDTH: 1 ms
MDC IDC MSMT LEADCHNL RA IMPEDANCE VALUE: 450 Ohm
MDC IDC MSMT LEADCHNL RA SENSING INTR AMPL: 4.8 mV
MDC IDC MSMT LEADCHNL RV PACING THRESHOLD PULSEWIDTH: 0.5 ms
MDC IDC MSMT LEADCHNL RV PACING THRESHOLD PULSEWIDTH: 0.5 ms
MDC IDC MSMT LEADCHNL RV SENSING INTR AMPL: 12 mV
MDC IDC SESS DTM: 20170203145745
MDC IDC SET LEADCHNL RV PACING AMPLITUDE: 2 V
MDC IDC STAT BRADY RA PERCENT PACED: 18 %
MDC IDC STAT BRADY RV PERCENT PACED: 99.66 %
Pulse Gen Serial Number: 7179731

## 2015-09-08 NOTE — Telephone Encounter (Signed)
Patient advised that there is no accuracy converting teaspoons to mg and that the numbers given to him this morning were not accurate. Patient advised to read the labels on his food products to manage staying under the 2000 mg/day sodium limitation. Patient verbalized understanding.

## 2015-09-08 NOTE — Progress Notes (Signed)
PCP:  [name], MD Primary Cardiologist:  [name]  The patient presents today for routine electrophysiology followup. Since his last visit, he has done well.   Today, he denies symptoms of palpitations, chest pain, shortness of breath, orthopnea, PND, lower extremity edema, dizziness, presyncope, syncope, or neurologic sequela.  The patient feels that he is tolerating medications without difficulties and is otherwise without complaint today.   Past Medical History  Diagnosis Date  . CAD (coronary artery disease)     Minimal nonobstructive CAD at catheterization 2009  . Chronic systolic heart failure (HCC)   . LBBB (left bundle branch block)   . Secondary cardiomyopathy (HCC)     Nonischemic  . Type 2 diabetes mellitus (HCC)   . Atrial fibrillation (HCC)     Declines anticoagulation  . Myocardial infarction (HCC)   . Essential hypertension   . Hyperlipidemia    Past Surgical History  Procedure Laterality Date  . Hernia repair    . Fiscula repair    . Hearing surgery    . Cholecystectomy    . Cardiac defibrillator placement  12/14/2007; 11/23/13    St. Jude Promote RF 3207 ICD placed by Dr. Graciela Husbands for CHF, nonischemic cardiomyopathy; gen change 11/2013 by Dr Johney Frame STJ  . Cataract extraction w/phaco Right 06/14/2013    Procedure: CATARACT EXTRACTION PHACO AND INTRAOCULAR LENS PLACEMENT (IOC);  Surgeon: Gemma Payor, MD;  Location: [facility];  Service: Ophthalmology;  Laterality: Right;  CDE:  21.74  . Cataract extraction w/phaco Left 07/08/2013    Procedure: CATARACT EXTRACTION PHACO AND INTRAOCULAR LENS PLACEMENT (IOC);  Surgeon: Gemma Payor, MD;  Location: [facility];  Service: Ophthalmology;  Laterality: Left;  CDE:24.66  . Implantable cardioverter defibrillator generator change N/A 11/23/2013    Procedure: IMPLANTABLE CARDIOVERTER DEFIBRILLATOR GENERATOR CHANGE;  Surgeon: Gardiner Rhyme, MD;  Location: [facility] CATH LAB;  Service: Cardiovascular;  Laterality: N/A;    Current Outpatient  Prescriptions  Medication Sig Dispense Refill  . carvedilol (COREG) 25 MG tablet TAKE 1 TABLET TWICE DAILY  WITH  MEALS 180 tablet 3  . cholecalciferol (VITAMIN D) 1000 UNITS tablet Take 1,000 Units by mouth daily.    . Cinnamon 500 MG TABS Take 1 tablet by mouth daily.     . Cyanocobalamin (VITAMIN B-12 CR PO) Take 1 tablet by mouth daily.    . digoxin (LANOXIN) 0.125 MG tablet TAKE 1 TABLET EVERY DAY 90 tablet 3  . Echinacea 400 MG CAPS Take 1 capsule by mouth every morning.    . furosemide (LASIX) 40 MG tablet TAKE 1/2 TABLET TWICE DAILY 90 tablet 3  . glipiZIDE (GLUCOTROL XL) 10 MG 24 hr tablet Take 10 mg by mouth 2 (two) times daily.    Marland Kitchen lisinopril (PRINIVIL,ZESTRIL) 10 MG tablet TAKE 1 TABLET EVERY DAY 90 tablet 3  . simvastatin (ZOCOR) 40 MG tablet TAKE 1 TABLET AT BEDTIME 90 tablet 3   No current facility-administered medications for this visit.    Allergies  Allergen Reactions  . Penicillins Hives and Rash    Social History   Social History  . Marital Status: Married    Spouse Name: N/A  . Number of Children: N/A  . Years of Education: N/A   Occupational History  . Retired    Social History Main Topics  . Smoking status: Former Smoker -- 0.30 packs/day for 5 years    Types: Cigarettes, Cigars    Start date: 02/15/1959    Quit date: 08/05/1965  . Smokeless tobacco: Never  Used  . Alcohol Use: No  . Drug Use: No  . Sexual Activity: Yes    Birth Control/ Protection: None   Other Topics Concern  . Not on file   Social History Narrative    Family History  Problem Relation Age of Onset  . Cancer    . Coronary artery disease    . Diabetes        Physical Exam: Filed Vitals:   09/08/15 1129  BP: 117/75  Pulse: 77  Height: 6' (1.829 m)  Weight: 154 lb (69.854 kg)    GEN- The patient is well appearing, alert and oriented x 3 today.   Head- normocephalic, atraumatic Eyes-  Sclera clear, conjunctiva pink Ears- hearing intact Oropharynx- clear Neck-  supple,  Lungs- Clear to ausculation bilaterally, normal work of breathing Chest- ICD pocket is well healed Heart- Regular rate and rhythm, no murmurs, rubs or gallops, PMI not laterally displaced GI- soft, NT, ND, + BS Extremities- no clubbing, cyanosis, or edema Neuro- strength and sensation are intact  ICD interrogation- reviewed in detail today,  See PACEART report  Assessment and Plan:  1. Chronic systolic dysfunction Normal BiV ICD function See Pace Art report No changes today Continue remote follow-up I will enroll in ICM device clinic with Randon Goldsmith He consumes a very high sodium diet.  We discussed this at length.  He is not ready to make lifestyle change  I have discussed SJM Fortify Assura advisary with the patient today. He understands that recommendation from SJM is to not replace the device at this time. The patient is not device dependant.  The patient has not had appropriate device therapy in the past or implanted for secondary prevention.  Vibratory alert demonstrated today.  He is actively remotely monitored and understands the importance of compliance today.  We have decided together to follow conservatively as recommended by manufacturer.   2. afib No recent afib by device interrogation He remains clear in his decision to decline anticoagulation therapy  Merlin Return to see me in 1 year  Hillis Range MD, [facility] 09/08/2015 12:25 PM

## 2015-09-08 NOTE — Patient Instructions (Addendum)
Your physician recommends that you continue on your current medications as directed. Please refer to the Current Medication list given to you today. Device check on 12/11/15. Your physician recommends that you schedule a follow-up appointment in: 1 year with Dr. Johney Frame. Please keep your sodium in your diet under 2 grams per day. You are being enrolled into the ICM clinic.

## 2015-10-24 ENCOUNTER — Telehealth: Payer: Self-pay

## 2015-10-24 NOTE — Telephone Encounter (Signed)
Patient referred to [facility] clinic by Dr Johney Frame.  Attempted call to patient and left voice mail message for patient to return call with call back number.

## 2015-10-27 NOTE — Telephone Encounter (Signed)
Attempted call to patient and left voice mail message for patient to return call with call back number.

## 2015-11-17 NOTE — Telephone Encounter (Signed)
Attempted ICM enrollment call to patient.  Wife stated he was not home.

## 2015-12-05 ENCOUNTER — Other Ambulatory Visit: Payer: Self-pay | Admitting: Cardiology

## 2015-12-05 NOTE — Telephone Encounter (Signed)
Call to patient regarding ICM clinic referral from Dr Johney Frame.  Patient [name] declined ICM clinic and does not want to be bothered on a monthly basis.   Advised him to call Dr [name] and Dr Johney Frame for any problems.

## 2015-12-11 ENCOUNTER — Ambulatory Visit (INDEPENDENT_AMBULATORY_CARE_PROVIDER_SITE_OTHER): Payer: Commercial Managed Care - HMO | Admitting: *Deleted

## 2015-12-11 DIAGNOSIS — I5022 Chronic systolic (congestive) heart failure: Secondary | ICD-10-CM

## 2015-12-11 DIAGNOSIS — I429 Cardiomyopathy, unspecified: Secondary | ICD-10-CM

## 2015-12-11 DIAGNOSIS — I420 Dilated cardiomyopathy: Secondary | ICD-10-CM

## 2015-12-11 DIAGNOSIS — Z95810 Presence of automatic (implantable) cardiac defibrillator: Secondary | ICD-10-CM

## 2015-12-11 NOTE — Progress Notes (Signed)
Remote ICD transmission.   

## 2016-01-17 ENCOUNTER — Encounter: Payer: Self-pay | Admitting: Cardiology

## 2016-01-17 LAB — CUP PACEART REMOTE DEVICE CHECK
Battery Remaining Longevity: 52 mo
Brady Statistic AP VS Percent: 1 %
Brady Statistic AS VS Percent: 1 %
Brady Statistic RA Percent Paced: 11 %
HIGH POWER IMPEDANCE MEASURED VALUE: 47 Ohm
HighPow Impedance: 47 Ohm
Implantable Lead Implant Date: 20090511
Implantable Lead Implant Date: 20090511
Implantable Lead Implant Date: 20090511
Implantable Lead Location: 753858
Implantable Lead Location: 753859
Implantable Lead Model: 4543
Implantable Lead Model: 7121
Lead Channel Impedance Value: 440 Ohm
Lead Channel Pacing Threshold Amplitude: 0.625 V
Lead Channel Pacing Threshold Amplitude: 1 V
Lead Channel Pacing Threshold Pulse Width: 0.5 ms
Lead Channel Sensing Intrinsic Amplitude: 3.9 mV
Lead Channel Setting Pacing Amplitude: 1.625
Lead Channel Setting Pacing Amplitude: 2 V
Lead Channel Setting Pacing Amplitude: 2 V
Lead Channel Setting Pacing Pulse Width: 1 ms
MDC IDC LEAD LOCATION: 753860
MDC IDC LEAD SERIAL: 141404
MDC IDC MSMT BATTERY REMAINING PERCENTAGE: 69 %
MDC IDC MSMT BATTERY VOLTAGE: 2.95 V
MDC IDC MSMT LEADCHNL LV IMPEDANCE VALUE: 360 Ohm
MDC IDC MSMT LEADCHNL LV PACING THRESHOLD PULSEWIDTH: 1 ms
MDC IDC MSMT LEADCHNL RV IMPEDANCE VALUE: 550 Ohm
MDC IDC MSMT LEADCHNL RV PACING THRESHOLD AMPLITUDE: 0.625 V
MDC IDC MSMT LEADCHNL RV PACING THRESHOLD PULSEWIDTH: 0.5 ms
MDC IDC MSMT LEADCHNL RV SENSING INTR AMPL: 12 mV
MDC IDC SESS DTM: 20170508060015
MDC IDC SET LEADCHNL RV PACING PULSEWIDTH: 0.5 ms
MDC IDC SET LEADCHNL RV SENSING SENSITIVITY: 0.5 mV
MDC IDC STAT BRADY AP VP PERCENT: 12 %
MDC IDC STAT BRADY AS VP PERCENT: 87 %
Pulse Gen Serial Number: 7179731

## 2016-03-11 ENCOUNTER — Ambulatory Visit (INDEPENDENT_AMBULATORY_CARE_PROVIDER_SITE_OTHER): Payer: Commercial Managed Care - HMO | Admitting: *Deleted

## 2016-03-11 DIAGNOSIS — I429 Cardiomyopathy, unspecified: Secondary | ICD-10-CM

## 2016-03-11 DIAGNOSIS — Z95810 Presence of automatic (implantable) cardiac defibrillator: Secondary | ICD-10-CM

## 2016-03-11 DIAGNOSIS — I420 Dilated cardiomyopathy: Secondary | ICD-10-CM

## 2016-03-11 DIAGNOSIS — I5022 Chronic systolic (congestive) heart failure: Secondary | ICD-10-CM

## 2016-03-11 NOTE — Progress Notes (Signed)
Remote ICD transmission.   

## 2016-03-13 ENCOUNTER — Encounter: Payer: Self-pay | Admitting: Cardiology

## 2016-03-15 LAB — CUP PACEART REMOTE DEVICE CHECK
Battery Remaining Longevity: 50 mo
Battery Voltage: 2.95 V
Brady Statistic AP VP Percent: 13 %
Brady Statistic AS VS Percent: 1 %
Brady Statistic RA Percent Paced: 12 %
Date Time Interrogation Session: 20170807080015
HIGH POWER IMPEDANCE MEASURED VALUE: 47 Ohm
HIGH POWER IMPEDANCE MEASURED VALUE: 47 Ohm
Implantable Lead Implant Date: 20090511
Implantable Lead Implant Date: 20090511
Implantable Lead Location: 753859
Implantable Lead Model: 4543
Implantable Lead Model: 7121
Implantable Lead Serial Number: 141404
Lead Channel Impedance Value: 340 Ohm
Lead Channel Impedance Value: 440 Ohm
Lead Channel Pacing Threshold Amplitude: 0.625 V
Lead Channel Pacing Threshold Amplitude: 0.875 V
Lead Channel Pacing Threshold Pulse Width: 0.5 ms
Lead Channel Pacing Threshold Pulse Width: 1 ms
Lead Channel Sensing Intrinsic Amplitude: 4.2 mV
Lead Channel Sensing Intrinsic Amplitude: 6.4 mV
Lead Channel Setting Pacing Amplitude: 2 V
Lead Channel Setting Pacing Pulse Width: 0.5 ms
Lead Channel Setting Sensing Sensitivity: 0.5 mV
MDC IDC LEAD IMPLANT DT: 20090511
MDC IDC LEAD LOCATION: 753858
MDC IDC LEAD LOCATION: 753860
MDC IDC MSMT BATTERY REMAINING PERCENTAGE: 65 %
MDC IDC MSMT LEADCHNL RA PACING THRESHOLD AMPLITUDE: 0.625 V
MDC IDC MSMT LEADCHNL RA PACING THRESHOLD PULSEWIDTH: 0.5 ms
MDC IDC MSMT LEADCHNL RV IMPEDANCE VALUE: 550 Ohm
MDC IDC SET LEADCHNL LV PACING AMPLITUDE: 2 V
MDC IDC SET LEADCHNL LV PACING PULSEWIDTH: 1 ms
MDC IDC SET LEADCHNL RA PACING AMPLITUDE: 1.625
MDC IDC STAT BRADY AP VS PERCENT: 1 %
MDC IDC STAT BRADY AS VP PERCENT: 86 %
Pulse Gen Serial Number: 7179731

## 2016-03-20 ENCOUNTER — Telehealth: Payer: Self-pay | Admitting: Cardiology

## 2016-03-20 NOTE — Telephone Encounter (Signed)
Attempted to call pt about home monitor being disconnected for at least 8 days. Both numbers are invalid numbers.

## 2016-04-05 ENCOUNTER — Telehealth: Payer: Self-pay | Admitting: Cardiology

## 2016-04-05 NOTE — Telephone Encounter (Signed)
LMOVM requesting that pt send manual transmission b/c home monitor has not updated in at least 8 days.   

## 2016-04-11 ENCOUNTER — Telehealth: Payer: Self-pay | Admitting: Cardiology

## 2016-04-11 NOTE — Telephone Encounter (Signed)
LMOVM requesting that pt send manual transmission b/c home monitor has not updated in at least 8 days.   

## 2016-04-18 ENCOUNTER — Telehealth: Payer: Self-pay | Admitting: Cardiology

## 2016-04-18 NOTE — Telephone Encounter (Signed)
Patient had concerns about his device site.  SJM CRT-D implanted in 11/2013.  Patient reports that site had healed well and that he has a faint scar.  He states that for the past 2-3 weeks the left side of the scar has become more red and "tingly".  Patient denies fever or chills, states that there has not been any drainage, but that "there could be some soon."  Patient and wife are agreeable to an appointment in the device clinic at [facility].  Per patient's wife, Friday will not work and Monday would be better.  She states that she was not aware that patient was noticing any issues with his device site and states she has not noticed anything abnormal.  She is agreeable to appointment on 04/22/16 at 11:00am.  Patient's wife verbalizes understanding of instructions to call our office or seek emergency medical attention if patient develops worsening symptoms, including fever or chills.

## 2016-04-18 NOTE — Telephone Encounter (Signed)
Spoke w/ pt and requested that he send a manual transmission b/c his home monitor has not updated in at least 8 days.   

## 2016-04-22 ENCOUNTER — Encounter: Payer: Self-pay | Admitting: Internal Medicine

## 2016-04-22 ENCOUNTER — Ambulatory Visit (INDEPENDENT_AMBULATORY_CARE_PROVIDER_SITE_OTHER): Payer: Commercial Managed Care - HMO | Admitting: Internal Medicine

## 2016-04-22 VITALS — BP 97/64 | HR 67 | Temp 95.8°F

## 2016-04-22 DIAGNOSIS — Z95810 Presence of automatic (implantable) cardiac defibrillator: Secondary | ICD-10-CM | POA: Diagnosis not present

## 2016-04-22 DIAGNOSIS — T827XXA Infection and inflammatory reaction due to other cardiac and vascular devices, implants and grafts, initial encounter: Secondary | ICD-10-CM

## 2016-04-22 LAB — CUP PACEART INCLINIC DEVICE CHECK
Battery Remaining Longevity: 46.8
Date Time Interrogation Session: 20170918135716
HIGH POWER IMPEDANCE MEASURED VALUE: 44.3276
Implantable Lead Implant Date: 20090511
Implantable Lead Implant Date: 20090511
Implantable Lead Model: 4543
Implantable Lead Serial Number: 141404
Lead Channel Impedance Value: 312.5 Ohm
Lead Channel Impedance Value: 437.5 Ohm
Lead Channel Pacing Threshold Amplitude: 0.625 V
Lead Channel Pacing Threshold Amplitude: 0.625 V
Lead Channel Pacing Threshold Amplitude: 2.25 V
Lead Channel Sensing Intrinsic Amplitude: 5 mV
Lead Channel Setting Pacing Amplitude: 3.25 V
MDC IDC LEAD IMPLANT DT: 20090511
MDC IDC LEAD LOCATION: 753858
MDC IDC LEAD LOCATION: 753859
MDC IDC LEAD LOCATION: 753860
MDC IDC LEAD MODEL: 7121
MDC IDC MSMT LEADCHNL LV PACING THRESHOLD PULSEWIDTH: 1 ms
MDC IDC MSMT LEADCHNL RA PACING THRESHOLD PULSEWIDTH: 0.5 ms
MDC IDC MSMT LEADCHNL RV IMPEDANCE VALUE: 537.5 Ohm
MDC IDC MSMT LEADCHNL RV PACING THRESHOLD PULSEWIDTH: 0.5 ms
MDC IDC MSMT LEADCHNL RV SENSING INTR AMPL: 12 mV
MDC IDC SET LEADCHNL LV PACING PULSEWIDTH: 1 ms
MDC IDC SET LEADCHNL RA PACING AMPLITUDE: 1.625
MDC IDC SET LEADCHNL RV PACING AMPLITUDE: 2 V
MDC IDC SET LEADCHNL RV PACING PULSEWIDTH: 0.5 ms
MDC IDC SET LEADCHNL RV SENSING SENSITIVITY: 0.5 mV
MDC IDC STAT BRADY RA PERCENT PACED: 12 %
MDC IDC STAT BRADY RV PERCENT PACED: 98 %
Pulse Gen Serial Number: 7179731

## 2016-04-22 NOTE — Patient Instructions (Addendum)
Please call the office if you experience fever/chills.   Please call if redness begins to spread or there is drainage from the area.  The office will call you to arrange a date for your ICD to be removed because of infection.  If you have not heard from a nurse by Thursday, please call to check on status of a date for your surgery.

## 2016-04-22 NOTE — Progress Notes (Signed)
HPI [name] presents today for an unscheduled visit due to his ICD pending protrusion through the skin. He is a pleasant 70 yo man with a non-ischemic CM, s/p BiV ICD insertion, who underwent ICD generator change out in 2015. He has been stable and his EF actually improved from 20 to 35-40% by echo in 2014. He has not had fever, chills, or night sweats. He has had some discomfort at his ICD insertion site and noticed thinning of his skin over the device. No redness or erythema. He notes that he has received a single ICD shock just after his device was initially placed in 2008. He has a h/o PAF and has refused systemic anti-coagulation. Finally, he has a h/o uncontrolled DM and has refused medical therapy and does not monitor his blood sugar. Allergies  Allergen Reactions  . Penicillins Hives and Rash     Current Outpatient Prescriptions  Medication Sig Dispense Refill  . carvedilol (COREG) 25 MG tablet TAKE 1 TABLET TWICE DAILY  WITH  MEALS 180 tablet 3  . cholecalciferol (VITAMIN D) 1000 UNITS tablet Take 1,000 Units by mouth daily.    . Cinnamon 500 MG TABS Take 1 tablet by mouth daily.     . digoxin (LANOXIN) 0.125 MG tablet TAKE 1 TABLET EVERY DAY 90 tablet 3  . furosemide (LASIX) 40 MG tablet TAKE 1/2 TABLET TWICE DAILY 90 tablet 3  . lisinopril (PRINIVIL,ZESTRIL) 10 MG tablet TAKE 1 TABLET EVERY DAY 90 tablet 3  . simvastatin (ZOCOR) 40 MG tablet TAKE 1 TABLET AT BEDTIME 90 tablet 3  . glipiZIDE (GLUCOTROL XL) 10 MG 24 hr tablet Take 10 mg by mouth 2 (two) times daily.     No current facility-administered medications for this visit.      Past Medical History:  Diagnosis Date  . Atrial fibrillation (HCC)    Declines anticoagulation  . CAD (coronary artery disease)    Minimal nonobstructive CAD at catheterization 2009  . Chronic systolic heart failure (HCC)   . Essential hypertension   . Hyperlipidemia   . LBBB (left bundle branch block)   . Myocardial infarction  (HCC)   . Secondary cardiomyopathy (HCC)    Nonischemic  . Type 2 diabetes mellitus (HCC)     ROS:   All systems reviewed and negative except as noted in the HPI.   Past Surgical History:  Procedure Laterality Date  . CARDIAC DEFIBRILLATOR PLACEMENT  12/14/2007; 11/23/13   St. Jude Promote RF 3207 ICD placed by Dr. Graciela HusbandsKlein for CHF, nonischemic cardiomyopathy; gen change 11/2013 by Dr Johney FrameAllred STJ  . CATARACT EXTRACTION W/PHACO Right 06/14/2013   Procedure: CATARACT EXTRACTION PHACO AND INTRAOCULAR LENS PLACEMENT (IOC);  Surgeon: Gemma PayorKerry Hunt, MD;  Location: [facility];  Service: Ophthalmology;  Laterality: Right;  CDE:  21.74  . CATARACT EXTRACTION W/PHACO Left 07/08/2013   Procedure: CATARACT EXTRACTION PHACO AND INTRAOCULAR LENS PLACEMENT (IOC);  Surgeon: Gemma PayorKerry Hunt, MD;  Location: [facility];  Service: Ophthalmology;  Laterality: Left;  CDE:24.66  . CHOLECYSTECTOMY    . Fiscula Repair    . Hearing Surgery    . HERNIA REPAIR    . IMPLANTABLE CARDIOVERTER DEFIBRILLATOR GENERATOR CHANGE N/A 11/23/2013   Procedure: IMPLANTABLE CARDIOVERTER DEFIBRILLATOR GENERATOR CHANGE;  Surgeon: Gardiner RhymeJames D Allred, MD;  [facility] C[facility] LAB;  Service: Cardiovascular;  Laterality: N/A;     Family History  Problem Relation Age of Onset  . Cancer    . Coronary artery disease    .  Diabetes       Social History   Social History  . Marital status: Married    Spouse name: N/A  . Number of children: N/A  . Years of education: N/A   Occupational History  . Retired    Social History Main Topics  . Smoking status: Former Smoker    Packs/day: 0.30    Years: 5.00    Types: Cigarettes, Cigars    Start date: 02/15/1959    Quit date: 08/05/1965  . Smokeless tobacco: Never Used  . Alcohol use No  . Drug use: No  . Sexual activity: Yes    Birth control/ protection: None   Other Topics Concern  . Not on file   Social History Narrative  . No narrative on file     BP 97/64 (BP Location: Right Arm, Patient  Position: Sitting, Cuff Size: Normal)   Pulse 67   Temp (!) 95.8 F (35.4 C) (Oral)   Physical Exam:  Well appearing 70 yo man,  HEENT: Unremarkable Neck:  No JVD, no thyromegally Lymphatics:  No adenopathy Back:  No CVA tenderness Lungs:  Clear with no wheezes, thinning of the skin with pending opening over the site in 2 distinct spots on the medial aspect of the incision. The device can be seen thru the paper thin skin. HEART:  Regular rate rhythm, no murmurs, no rubs, no clicks Abd:  soft, positive bowel sounds, no organomegally, no rebound, no guarding Ext:  2 plus pulses, no edema, no cyanosis, no clubbing Skin:  No rashes no nodules Neuro:  CN II through XII intact, motor grossly intact  EKG - nsr (reviewed)  DEVICE  Normal device function.  See PaceArt for details.   Assess/Plan: 1. Pending ICD pocket erosion - I have discussed the situation with the patient. He is not clinically ill. I have scheduled ICD system extraction. He does not appear to be device dependent. He will undergo extraction as our schedule allows. I have discussed the risks/benefits/goals/expectations of the procedure with the patient. He is willing to proceed. 2. Chronic systolic heart failure - he appears to be well controlled. 3. Atrial fib - he is still refusing anti-coagulation. He is mostly in NSR.   Leonia Reeves.D.

## 2016-04-23 ENCOUNTER — Telehealth: Payer: Self-pay | Admitting: Internal Medicine

## 2016-04-23 NOTE — Telephone Encounter (Signed)
Follow Up:    She wants you to know that they are thinking about lead extraction on Wednesday,05-01-16.

## 2016-04-23 NOTE — Telephone Encounter (Signed)
Forwarding to Dr. Lubertha Basque nurse, Tresa Endo, to address.  She is coordinating scheduling this.

## 2016-04-24 NOTE — Telephone Encounter (Signed)
Returned call to patient and he does not want to have extraction done on 04/29/16.  He is adamant that he is going to see his PCP before moving forward with device extraction.  I explained to him the importance of having it removed as did not want him to become systemically infected from pocket infection.  Patient verbalizes understanding of my concerns and states that he is "willing to take the risk."  Patient has many concerns about his insurance coverage for this procedure and states he is not willing to proceed with anything that requires out of pocket expenses.  Attempted to explain to patient that Dr. Ladona Ridgel does not have any control over what is billed to his insurance and that he can contact the billing office and his insurance for further concerns.  Patient aware that the next available extraction procedure date after 04/29/16 is 05/20/16.  He states that he will call after his PCP appointment.

## 2016-04-24 NOTE — Telephone Encounter (Signed)
F/u Message  Pt call requesting to speak with RN. Pt states he wants to have a Office visit  with is PCP before scheduling appt for removal of ICD infection. Please call back to discuss

## 2016-04-24 NOTE — Telephone Encounter (Signed)
I have left a message for the patient to call me to discuss the date(04/29/16) of explant and time to be at the hospital.  He needs to be at the [facility] [place] at 11:30, NPO after MN and prepared to spend one night

## 2016-04-25 ENCOUNTER — Telehealth: Payer: Self-pay | Admitting: Internal Medicine

## 2016-04-25 ENCOUNTER — Encounter (HOSPITAL_COMMUNITY): Payer: Self-pay | Admitting: *Deleted

## 2016-04-25 NOTE — Telephone Encounter (Signed)
New message    Pt calling wanting to let Tresa Endo know that he is postponing the procedure for the removal of the ICD. He wants to see his PCP first and he needed time to think about the procedure. He states that he has an infection in the area of the ICD and that he know that he is putting himself at risk. He would like to reschedule in mid Oct. Please call.

## 2016-04-25 NOTE — Telephone Encounter (Signed)
Deliah Boston, RN at 04/24/2016 5:33 PM   Status: Signed    Returned call to patient and he does not want to have extraction done on 04/29/16.  He is adamant that he is going to see his PCP before moving forward with device extraction.  I explained to him the importance of having it removed as did not want him to become systemically infected from pocket infection.  Patient verbalizes understanding of my concerns and states that he is "willing to take the risk."  Patient has many concerns about his insurance coverage for this procedure and states he is not willing to proceed with anything that requires out of pocket expenses.  Attempted to explain to patient that Dr. Ladona Ridgel does not have any control over what is billed to his insurance and that he can contact the billing office and his insurance for further concerns.  Patient aware that the next available extraction procedure date after 04/29/16 is 05/20/16.  He states that he will call after his PCP appointment.       He could not remember who he spoke with yesterday and his wife played the message from yesterday and told him to call today.  I assured him it has been canceled

## 2016-04-29 ENCOUNTER — Encounter (HOSPITAL_COMMUNITY): Admission: RE | Payer: Self-pay | Source: Ambulatory Visit

## 2016-04-29 ENCOUNTER — Inpatient Hospital Stay (HOSPITAL_COMMUNITY): Admission: RE | Admit: 2016-04-29 | Payer: Commercial Managed Care - HMO | Source: Ambulatory Visit

## 2016-04-29 SURGERY — REMOVAL, ELECTRODE LEAD, ICD
Anesthesia: General | Site: Chest

## 2016-05-01 ENCOUNTER — Telehealth: Payer: Self-pay | Admitting: Internal Medicine

## 2016-05-01 DIAGNOSIS — E782 Mixed hyperlipidemia: Secondary | ICD-10-CM | POA: Diagnosis not present

## 2016-05-01 DIAGNOSIS — Z23 Encounter for immunization: Secondary | ICD-10-CM | POA: Diagnosis not present

## 2016-05-01 DIAGNOSIS — N400 Enlarged prostate without lower urinary tract symptoms: Secondary | ICD-10-CM | POA: Diagnosis not present

## 2016-05-01 DIAGNOSIS — E1165 Type 2 diabetes mellitus with hyperglycemia: Secondary | ICD-10-CM | POA: Diagnosis not present

## 2016-05-01 DIAGNOSIS — I422 Other hypertrophic cardiomyopathy: Secondary | ICD-10-CM | POA: Diagnosis not present

## 2016-05-01 DIAGNOSIS — Z6824 Body mass index (BMI) 24.0-24.9, adult: Secondary | ICD-10-CM | POA: Diagnosis not present

## 2016-05-01 DIAGNOSIS — L03313 Cellulitis of chest wall: Secondary | ICD-10-CM | POA: Diagnosis not present

## 2016-05-01 NOTE — Telephone Encounter (Signed)
New Message  Pt call requesting to speak with RN. Pt states he was schedule to get his defibrillator removed. Pt states we was diagnosed with and ifection around his defib area and was prescribed antibiotics. Pt states he was prescribed Keflex 250mg . Pt stats Dr. Neita Carp placed pt on med for 3weeks. Pt states he has a f/u appt in 4 months with Dr. Neita Carp. Pt call to inform provider of information. Pt states if more information is needed please call Dr. Fara Chute [phone]. Please call back to discuss if needed .

## 2016-05-01 NOTE — Telephone Encounter (Signed)
  Spoke with patient and let him know Dr Lubertha Basque recommendations still stand for extracting the device.  He wants to schedule for after the 16th of Oct.  I have sent message to [facility] to get date and time.  Will call him back once I have confirmed date and time.  He is aware is he starts to run a fever and have chills he should proceed to the ER.

## 2016-05-02 ENCOUNTER — Telehealth: Payer: Self-pay | Admitting: Cardiology

## 2016-05-02 NOTE — Telephone Encounter (Signed)
Wants to know when he needs to be seen.   He is currently suppose to be having his device removed due to infection at the end of October. Was told by [facility] EP to ask Dr [name] when he would need to be seen after by him

## 2016-05-02 NOTE — Telephone Encounter (Signed)
Called pt. Review extraction date of 06/04/16 arriving at 11:30 AM / procedure time 1:30 PM.  Informed pt to arrive at the [facility] on 06/04/16 at 11:30 AM  Will draw labs at the hospital day of procedure.  Nothing to eat or drink after midnight the night before procedure  Do not take any medication day of procedure  Plan 1 night stay  Pt stated he had billing questions. I gave him the number to call for that department. I also advised to check with his insurance company.   Pt verbalized understanding and agreed with plan

## 2016-05-03 NOTE — Telephone Encounter (Signed)
Pt aware, didn't want to schedule at this time. Recall placed

## 2016-05-03 NOTE — Telephone Encounter (Signed)
Pt is scheduled for device removal 10/31 - pt is recalled to see Dr [name] in November will forward to see if pt f/u with Dr [name] should be at end [facility] November

## 2016-05-03 NOTE — Telephone Encounter (Signed)
Returned call

## 2016-05-03 NOTE — Telephone Encounter (Signed)
Yes I should see him after the procedure is done, sometime in November would be reasonable if he is clinically stable.

## 2016-06-03 ENCOUNTER — Encounter (HOSPITAL_COMMUNITY): Payer: Self-pay | Admitting: *Deleted

## 2016-06-03 ENCOUNTER — Telehealth: Payer: Self-pay | Admitting: Internal Medicine

## 2016-06-03 MED ORDER — VANCOMYCIN HCL IN DEXTROSE 1-5 GM/200ML-% IV SOLN
1000.0000 mg | INTRAVENOUS | Status: AC
Start: 2016-06-04 — End: 2016-06-04
  Administered 2016-06-04: 1000 mg via INTRAVENOUS
  Filled 2016-06-03: qty 200

## 2016-06-03 MED ORDER — GENTAMICIN SULFATE 40 MG/ML IJ SOLN: 80.0000 mg | INTRAMUSCULAR | Status: DC

## 2016-06-03 MED ORDER — SODIUM CHLORIDE 0.9 % IR SOLN
80.0000 mg | Status: AC
  Administered 2016-06-04: 80 mg
  Filled 2016-06-03: qty 2

## 2016-06-03 NOTE — Progress Notes (Signed)
Anesthesia Chart Review:  Pt is a same day work up.   Pt is a 70 year old male scheduled for ICD lead removal, TEE on 06/04/2016 with Lewayne Bunting, MD.   PCP is Fara Chute, MD.   PMH includes:  CAD (mild by 2009 cath), CHF, cardiomyopathy, AICD, LBBB, atrial fibrillation, HTN, DM, hyperlipidemia.  Deaf in R ear. Former smoker. BMI 21  Medications include: carvedilol, digoxin, lasix, glipizide, lisinopril, metformin, simvastatin.   Labs will be obtained DOS. HgbA1c was 11.1 on 05/01/16 at PCP's office. Pt reports his fasting glucose that day was 308. Metformin was added to regimen. Pt does not check glucose at home. Notified Sheri in Dr. Lubertha Basque office of likely uncontrolled DM.   EKG 06/06/15: electronic ventricular pacemaker.   Echo 11/18/13:  - Left ventricle: The cavity size was normal. Wall thickness was increased in a pattern of mild LVH. Systolic function was moderately reduced. The estimated ejection fraction was in the range of 35% to 40%. Diffuse hypokinesis. Therewas an increased relative contribution of atrial contraction to ventricular filling. Doppler parameters are consistent with abnormal left ventricular relaxation (grade 1 diastolic dysfunction). - Aortic valve: Mildly calcified annulus. Trileaflet. Mild regurgitation. - Mitral valve: Mildly thickened leaflets. Mild regurgitation. - Right ventricle: The cavity size was mildly dilated. Pacer wire or catheter noted in right ventricle. - Right atrium: Pacer wire or catheter noted in right atrium. - Tricuspid valve: Mild regurgitation.  Cardiac cath 08/07/07:  - Severe nonischemic cardiomyopathy.  Mild coronary plaque. Mildly elevated pulmonary pressures with RV pressures being relatively normal.  There was no evidence on the RV tracing of a restrictive physiology.  If labs acceptable DOS, I anticipate pt can proceed as scheduled.   Rica Mast, FNP-BC [facility]/Anesthesiology Phone:  [phone] 06/03/2016 1:45 PM'

## 2016-06-03 NOTE — Telephone Encounter (Signed)
New message  Ms. Fenda from [facility] calling to get pre-authorization for patient surgery on 10/31  ICD pocket erosion surgery  Also needs to know if this is inpatient or outpatient surgery  Surgery is being done by Lewayne Bunting

## 2016-06-03 NOTE — Progress Notes (Signed)
St. Jude rep did not return page

## 2016-06-03 NOTE — Telephone Encounter (Signed)
New message  Nurse, Rosey Bath @ [facility]  Needs orders to schedule surgery/ICD removal

## 2016-06-03 NOTE — Progress Notes (Signed)
Paged [employer] Jude rep.

## 2016-06-03 NOTE — Progress Notes (Signed)
   06/03/16 1052  OBSTRUCTIVE SLEEP APNEA  Have you ever been diagnosed with sleep apnea through a sleep study? No  Do you snore loudly (loud enough to be heard through closed doors)?  1  Do you often feel tired, fatigued, or sleepy during the daytime (such as falling asleep during driving or talking to someone)? 1  Has anyone observed you stop breathing during your sleep? 0  Do you have, or are you being treated for high blood pressure? 1  BMI more than 35 kg/m2? 0  Age > 50 (1-yes) 1  Male Gender (Yes=1) 1  Obstructive Sleep Apnea Score 5  Score 5 or greater  Results sent to PCP

## 2016-06-03 NOTE — Progress Notes (Signed)
Spoke with pt for pre-op call. Pt has long cardiac history, sees Dr. [name] and Dr. Ladona Ridgel. Pt denies any recent chest pain or sob. States he has "low energy". Pt is diabetic. States he had an A1C done on 05/01/16. He doesn't remember what it was. I called Dr. Dian Situ office and they will fax the results. Pt states his fasting blood sugar that day in the office was 308. Pt does not check his blood sugar at home because he can't afford the copay of his testing strips. He does state that Dr. Neita Carp added Metformin 500mg  once a day to his medicine regimen. Continues to take Glipizide also.

## 2016-06-03 NOTE — Telephone Encounter (Signed)
Rosey Bath RN with [facility] is calling to request for Dr Ladona Ridgel to place hospital orders in on this pt, for scheduled ICD Lead Removal for tomorrow 06/04/16 at 1:30pm. Informed Rosey Bath that I made the covering RN, working with Dr Ladona Ridgel today, to have him place hospital orders in for 10/31 procedure. Rosey Bath verbalized understanding and agrees with this plan.

## 2016-06-04 ENCOUNTER — Inpatient Hospital Stay (HOSPITAL_COMMUNITY): Payer: Commercial Managed Care - HMO | Admitting: Emergency Medicine

## 2016-06-04 ENCOUNTER — Encounter (HOSPITAL_COMMUNITY): Payer: Self-pay | Admitting: Urology

## 2016-06-04 ENCOUNTER — Inpatient Hospital Stay (HOSPITAL_COMMUNITY): Payer: Commercial Managed Care - HMO

## 2016-06-04 ENCOUNTER — Ambulatory Visit (HOSPITAL_COMMUNITY)
Admission: RE | Admit: 2016-06-04 | Discharge: 2016-06-05 | Disposition: A | Discharge status: Home / Self Care | Payer: Commercial Managed Care - HMO | Source: Ambulatory Visit | Attending: Internal Medicine | Admitting: Internal Medicine

## 2016-06-04 ENCOUNTER — Encounter (HOSPITAL_COMMUNITY)
Admission: RE | Disposition: A | Discharge status: Home / Self Care | Payer: Self-pay | Source: Ambulatory Visit | Attending: Internal Medicine

## 2016-06-04 DIAGNOSIS — I2510 Atherosclerotic heart disease of native coronary artery without angina pectoris: Secondary | ICD-10-CM | POA: Insufficient documentation

## 2016-06-04 DIAGNOSIS — I447 Left bundle-branch block, unspecified: Secondary | ICD-10-CM | POA: Insufficient documentation

## 2016-06-04 DIAGNOSIS — I252 Old myocardial infarction: Secondary | ICD-10-CM | POA: Diagnosis not present

## 2016-06-04 DIAGNOSIS — H918X1 Other specified hearing loss, right ear: Secondary | ICD-10-CM | POA: Diagnosis not present

## 2016-06-04 DIAGNOSIS — Z809 Family history of malignant neoplasm, unspecified: Secondary | ICD-10-CM | POA: Insufficient documentation

## 2016-06-04 DIAGNOSIS — Z833 Family history of diabetes mellitus: Secondary | ICD-10-CM | POA: Diagnosis not present

## 2016-06-04 DIAGNOSIS — I480 Paroxysmal atrial fibrillation: Secondary | ICD-10-CM | POA: Insufficient documentation

## 2016-06-04 DIAGNOSIS — Z880 Allergy status to penicillin: Secondary | ICD-10-CM | POA: Diagnosis not present

## 2016-06-04 DIAGNOSIS — Z8249 Family history of ischemic heart disease and other diseases of the circulatory system: Secondary | ICD-10-CM | POA: Insufficient documentation

## 2016-06-04 DIAGNOSIS — I110 Hypertensive heart disease with heart failure: Secondary | ICD-10-CM | POA: Diagnosis not present

## 2016-06-04 DIAGNOSIS — I429 Cardiomyopathy, unspecified: Secondary | ICD-10-CM | POA: Insufficient documentation

## 2016-06-04 DIAGNOSIS — E1151 Type 2 diabetes mellitus with diabetic peripheral angiopathy without gangrene: Secondary | ICD-10-CM | POA: Diagnosis not present

## 2016-06-04 DIAGNOSIS — Y713 Surgical instruments, materials and cardiovascular devices (including sutures) associated with adverse incidents: Secondary | ICD-10-CM | POA: Insufficient documentation

## 2016-06-04 DIAGNOSIS — T827XXD Infection and inflammatory reaction due to other cardiac and vascular devices, implants and grafts, subsequent encounter: Secondary | ICD-10-CM | POA: Diagnosis not present

## 2016-06-04 DIAGNOSIS — Z7984 Long term (current) use of oral hypoglycemic drugs: Secondary | ICD-10-CM | POA: Insufficient documentation

## 2016-06-04 DIAGNOSIS — Z87891 Personal history of nicotine dependence: Secondary | ICD-10-CM | POA: Diagnosis not present

## 2016-06-04 DIAGNOSIS — T827XXA Infection and inflammatory reaction due to other cardiac and vascular devices, implants and grafts, initial encounter: Secondary | ICD-10-CM | POA: Diagnosis not present

## 2016-06-04 DIAGNOSIS — T827XXS Infection and inflammatory reaction due to other cardiac and vascular devices, implants and grafts, sequela: Secondary | ICD-10-CM

## 2016-06-04 DIAGNOSIS — E785 Hyperlipidemia, unspecified: Secondary | ICD-10-CM | POA: Diagnosis not present

## 2016-06-04 DIAGNOSIS — I5022 Chronic systolic (congestive) heart failure: Secondary | ICD-10-CM | POA: Insufficient documentation

## 2016-06-04 HISTORY — DX: Pneumonia, unspecified organism: J18.9

## 2016-06-04 HISTORY — PX: CARDIAC DEFIBRILLATOR REMOVAL: SHX1291

## 2016-06-04 HISTORY — DX: Unspecified hearing loss, right ear: H91.91

## 2016-06-04 HISTORY — PX: ICD LEAD REMOVAL: SHX5855

## 2016-06-04 HISTORY — PX: TEE WITHOUT CARDIOVERSION: SHX5443

## 2016-06-04 LAB — CBC
HCT: 41.6 % (ref 39.0–52.0)
HEMOGLOBIN: 15 g/dL (ref 13.0–17.0)
MCH: 32.5 pg (ref 26.0–34.0)
MCHC: 36.1 g/dL — AB (ref 30.0–36.0)
MCV: 90 fL (ref 78.0–100.0)
Platelets: 188 10*3/uL (ref 150–400)
RBC: 4.62 MIL/uL (ref 4.22–5.81)
RDW: 13.1 % (ref 11.5–15.5)
WBC: 6.1 10*3/uL (ref 4.0–10.5)

## 2016-06-04 LAB — BASIC METABOLIC PANEL
ANION GAP: 9 (ref 5–15)
BUN: 24 mg/dL — ABNORMAL HIGH (ref 6–20)
CALCIUM: 9.1 mg/dL (ref 8.9–10.3)
CO2: 25 mmol/L (ref 22–32)
Chloride: 102 mmol/L (ref 101–111)
Creatinine, Ser: 0.83 mg/dL (ref 0.61–1.24)
GLUCOSE: 201 mg/dL — AB (ref 65–99)
Potassium: 4.3 mmol/L (ref 3.5–5.1)
SODIUM: 136 mmol/L (ref 135–145)

## 2016-06-04 LAB — SURGICAL PCR SCREEN
MRSA, PCR: NEGATIVE
STAPHYLOCOCCUS AUREUS: NEGATIVE

## 2016-06-04 LAB — PREPARE RBC (CROSSMATCH)

## 2016-06-04 LAB — GLUCOSE, CAPILLARY
GLUCOSE-CAPILLARY: 196 mg/dL — AB (ref 65–99)
Glucose-Capillary: 192 mg/dL — ABNORMAL HIGH (ref 65–99)
Glucose-Capillary: 202 mg/dL — ABNORMAL HIGH (ref 65–99)

## 2016-06-04 LAB — ABO/RH: ABO/RH(D): A POS

## 2016-06-04 SURGERY — REMOVAL, ELECTRODE LEAD, ICD
Anesthesia: General | Site: Chest

## 2016-06-04 MED ORDER — PHENYLEPHRINE 40 MCG/ML (10ML) SYRINGE FOR IV PUSH (FOR BLOOD PRESSURE SUPPORT)
PREFILLED_SYRINGE | INTRAVENOUS | Status: AC
  Filled 2016-06-04: qty 10

## 2016-06-04 MED ORDER — ACETAMINOPHEN 325 MG PO TABS
325.0000 mg | ORAL_TABLET | ORAL | Status: DC | PRN
Start: 2016-06-04 — End: 2016-06-05

## 2016-06-04 MED ORDER — ONDANSETRON HCL 4 MG/2ML IJ SOLN
INTRAMUSCULAR | Status: DC | PRN
  Administered 2016-06-04: 4 mg via INTRAVENOUS

## 2016-06-04 MED ORDER — SODIUM CHLORIDE 0.9 % IV SOLN
INTRAVENOUS | Status: DC | PRN
  Administered 2016-06-04: 500 mL

## 2016-06-04 MED ORDER — DIGOXIN 125 MCG PO TABS
125.0000 ug | ORAL_TABLET | Freq: Every day | ORAL | Status: DC
  Administered 2016-06-05: 125 ug via ORAL
  Filled 2016-06-04: qty 1

## 2016-06-04 MED ORDER — SODIUM CHLORIDE 0.9 % IV SOLN
INTRAVENOUS | Status: DC
  Filled 2016-06-04: qty 1000

## 2016-06-04 MED ORDER — FUROSEMIDE 20 MG PO TABS
20.0000 mg | ORAL_TABLET | Freq: Two times a day (BID) | ORAL | Status: DC
  Administered 2016-06-04 – 2016-06-05 (×2): 20 mg via ORAL
  Filled 2016-06-04 (×2): qty 1

## 2016-06-04 MED ORDER — FENTANYL CITRATE (PF) 250 MCG/5ML IJ SOLN
INTRAMUSCULAR | Status: DC | PRN
  Administered 2016-06-04 (×3): 50 ug via INTRAVENOUS

## 2016-06-04 MED ORDER — VANCOMYCIN HCL IN DEXTROSE 1-5 GM/200ML-% IV SOLN
1000.0000 mg | Freq: Two times a day (BID) | INTRAVENOUS | Status: AC
  Administered 2016-06-05: 1000 mg via INTRAVENOUS
  Filled 2016-06-04: qty 200

## 2016-06-04 MED ORDER — MIDAZOLAM HCL 5 MG/5ML IJ SOLN
INTRAMUSCULAR | Status: DC | PRN
  Administered 2016-06-04: 2 mg via INTRAVENOUS

## 2016-06-04 MED ORDER — LIDOCAINE HCL (PF) 1 % IJ SOLN: INTRAMUSCULAR | Status: DC | PRN

## 2016-06-04 MED ORDER — CHLORHEXIDINE GLUCONATE 4 % EX LIQD: 60.0000 mL | Freq: Once | CUTANEOUS | Status: DC

## 2016-06-04 MED ORDER — LISINOPRIL 10 MG PO TABS
10.0000 mg | ORAL_TABLET | Freq: Every day | ORAL | Status: DC
  Administered 2016-06-04 – 2016-06-05 (×2): 10 mg via ORAL
  Filled 2016-06-04 (×2): qty 1

## 2016-06-04 MED ORDER — ONDANSETRON HCL 4 MG/2ML IJ SOLN: 4.0000 mg | Freq: Four times a day (QID) | INTRAMUSCULAR | Status: DC | PRN

## 2016-06-04 MED ORDER — CINNAMON 500 MG PO TABS: 500.0000 mg | ORAL_TABLET | Freq: Every day | ORAL | Status: DC

## 2016-06-04 MED ORDER — CARVEDILOL 25 MG PO TABS
25.0000 mg | ORAL_TABLET | Freq: Two times a day (BID) | ORAL | Status: DC
  Administered 2016-06-04 – 2016-06-05 (×2): 25 mg via ORAL
  Filled 2016-06-04 (×2): qty 1

## 2016-06-04 MED ORDER — ONDANSETRON HCL 4 MG/2ML IJ SOLN: 4.0000 mg | Freq: Once | INTRAMUSCULAR | Status: DC | PRN

## 2016-06-04 MED ORDER — LACTATED RINGERS IV SOLN
INTRAVENOUS | Status: DC
  Administered 2016-06-04 (×3): via INTRAVENOUS

## 2016-06-04 MED ORDER — MIDAZOLAM HCL 2 MG/2ML IJ SOLN
INTRAMUSCULAR | Status: AC
  Filled 2016-06-04: qty 2

## 2016-06-04 MED ORDER — PROPOFOL 10 MG/ML IV BOLUS
INTRAVENOUS | Status: AC
  Filled 2016-06-04: qty 20

## 2016-06-04 MED ORDER — MUPIROCIN 2 % EX OINT
TOPICAL_OINTMENT | CUTANEOUS | Status: AC
  Administered 2016-06-04: 1 via TOPICAL
  Filled 2016-06-04: qty 22

## 2016-06-04 MED ORDER — PHENYLEPHRINE HCL 10 MG/ML IJ SOLN
INTRAMUSCULAR | Status: DC | PRN
  Administered 2016-06-04: 30 ug/min via INTRAVENOUS

## 2016-06-04 MED ORDER — ROCURONIUM BROMIDE 100 MG/10ML IV SOLN
INTRAVENOUS | Status: DC | PRN
  Administered 2016-06-04: 50 mg via INTRAVENOUS

## 2016-06-04 MED ORDER — LIDOCAINE HCL (PF) 1 % IJ SOLN
INTRAMUSCULAR | Status: AC
  Filled 2016-06-04: qty 30

## 2016-06-04 MED ORDER — FENTANYL CITRATE (PF) 250 MCG/5ML IJ SOLN
INTRAMUSCULAR | Status: AC
  Filled 2016-06-04: qty 5

## 2016-06-04 MED ORDER — METFORMIN HCL 500 MG PO TABS
500.0000 mg | ORAL_TABLET | Freq: Every evening | ORAL | Status: DC
  Filled 2016-06-04: qty 1

## 2016-06-04 MED ORDER — EPHEDRINE SULFATE 50 MG/ML IJ SOLN
INTRAMUSCULAR | Status: DC | PRN
  Administered 2016-06-04: 10 mg via INTRAVENOUS

## 2016-06-04 MED ORDER — VITAMIN D 1000 UNITS PO TABS
1000.0000 [IU] | ORAL_TABLET | Freq: Every day | ORAL | Status: DC
  Administered 2016-06-05: 1000 [IU] via ORAL
  Filled 2016-06-04: qty 1

## 2016-06-04 MED ORDER — MUPIROCIN 2 % EX OINT
1.0000 "application " | TOPICAL_OINTMENT | Freq: Once | CUTANEOUS | Status: AC
  Administered 2016-06-04: 1 via TOPICAL

## 2016-06-04 MED ORDER — FENTANYL CITRATE (PF) 100 MCG/2ML IJ SOLN: 25.0000 ug | INTRAMUSCULAR | Status: DC | PRN

## 2016-06-04 MED ORDER — LIDOCAINE HCL (CARDIAC) 20 MG/ML IV SOLN
INTRAVENOUS | Status: DC | PRN
  Administered 2016-06-04: 100 mg via INTRATRACHEAL

## 2016-06-04 MED ORDER — GLIPIZIDE ER 10 MG PO TB24
10.0000 mg | ORAL_TABLET | Freq: Two times a day (BID) | ORAL | Status: DC
  Administered 2016-06-05: 10 mg via ORAL
  Filled 2016-06-04 (×3): qty 1

## 2016-06-04 MED ORDER — SUGAMMADEX SODIUM 200 MG/2ML IV SOLN
INTRAVENOUS | Status: DC | PRN
  Administered 2016-06-04: 150 mg via INTRAVENOUS

## 2016-06-04 MED ORDER — SIMVASTATIN 40 MG PO TABS
40.0000 mg | ORAL_TABLET | Freq: Every day | ORAL | Status: DC
  Administered 2016-06-04: 40 mg via ORAL
  Filled 2016-06-04: qty 1

## 2016-06-04 MED ORDER — PROPOFOL 10 MG/ML IV BOLUS
INTRAVENOUS | Status: DC | PRN
  Administered 2016-06-04: 120 mg via INTRAVENOUS

## 2016-06-04 SURGICAL SUPPLY — 56 items
BAG BANDED W/RUBBER/TAPE 36X54 (MISCELLANEOUS) IMPLANT
BAG DECANTER FOR FLEXI CONT (MISCELLANEOUS) ×4 IMPLANT
BLADE 10 SAFETY STRL DISP (BLADE) IMPLANT
BLADE OSCILLATING /SAGITTAL (BLADE) IMPLANT
BLADE STERNUM SYSTEM 6 (BLADE) ×2 IMPLANT
BLADE SURG ROTATE 9660 (MISCELLANEOUS) IMPLANT
BNDG COHESIVE 4X5 WHT NS (GAUZE/BANDAGES/DRESSINGS) ×2 IMPLANT
CANISTER SUCTION 2500CC (MISCELLANEOUS) ×2 IMPLANT
COIL ONE TIE COMPRESSION (MISCELLANEOUS) IMPLANT
COVER TABLE BACK 60X90 (DRAPES) ×2 IMPLANT
DRAPE C-ARM 42X72 X-RAY (DRAPES) IMPLANT
DRAPE CARDIOVASCULAR INCISE (DRAPES) ×1
DRAPE INCISE IOBAN 66X45 STRL (DRAPES) IMPLANT
DRAPE PROXIMA HALF (DRAPES) IMPLANT
DRAPE SRG 135X102X78XABS (DRAPES) ×1 IMPLANT
DRSG COVADERM 4X6 (GAUZE/BANDAGES/DRESSINGS) ×2 IMPLANT
DRSG TEGADERM 4X4.75 (GAUZE/BANDAGES/DRESSINGS) ×2 IMPLANT
ELECT REM PT RETURN 9FT ADLT (ELECTROSURGICAL) ×4
ELECTRODE REM PT RTRN 9FT ADLT (ELECTROSURGICAL) ×2 IMPLANT
FELT TEFLON 1X6 (MISCELLANEOUS) IMPLANT
GAUZE IODOFORM PACK 1/2 7832 (GAUZE/BANDAGES/DRESSINGS) ×2 IMPLANT
GAUZE SPONGE 4X4 12PLY STRL (GAUZE/BANDAGES/DRESSINGS) ×2 IMPLANT
GAUZE SPONGE 4X4 16PLY XRAY LF (GAUZE/BANDAGES/DRESSINGS) IMPLANT
GLOVE BIOGEL PI IND STRL 7.5 (GLOVE) ×1 IMPLANT
GLOVE BIOGEL PI INDICATOR 7.5 (GLOVE) ×1
GLOVE ECLIPSE 8.0 STRL XLNG CF (GLOVE) ×2 IMPLANT
GOWN STRL REUS W/ TWL LRG LVL3 (GOWN DISPOSABLE) ×1 IMPLANT
GOWN STRL REUS W/ TWL XL LVL3 (GOWN DISPOSABLE) ×1 IMPLANT
GOWN STRL REUS W/TWL LRG LVL3 (GOWN DISPOSABLE) ×1
GOWN STRL REUS W/TWL XL LVL3 (GOWN DISPOSABLE) ×1
KIT ROOM TURNOVER OR (KITS) ×2 IMPLANT
NEEDLE PERC 18GX7CM (NEEDLE) ×2 IMPLANT
NS IRRIG 1000ML POUR BTL (IV SOLUTION) IMPLANT
PAD ARMBOARD 7.5X6 YLW CONV (MISCELLANEOUS) ×4 IMPLANT
PAD ELECT DEFIB RADIOL ZOLL (MISCELLANEOUS) ×2 IMPLANT
SHEATH EVOLUTION RL 11F (SHEATH) ×2 IMPLANT
SHEATH EVOLUTION SHORTIE RL 9F (SHEATH) ×2 IMPLANT
SHEATH PINNACLE 6F 10CM (SHEATH) ×2 IMPLANT
SHEATH PINNACLE R/O II 6F 4CM (SHEATH) IMPLANT
SPONGE GAUZE 4X4 12PLY STER LF (GAUZE/BANDAGES/DRESSINGS) ×4 IMPLANT
STYLET LIBERATOR LOCKING (MISCELLANEOUS) ×4 IMPLANT
SUT ETHILON 2 0 PSLX (SUTURE) ×4 IMPLANT
SUT PROLENE 2 0 CT2 30 (SUTURE) IMPLANT
SUT PROLENE 2 0 SH DA (SUTURE) IMPLANT
SUT VIC AB 2-0 CT2 18 VCP726D (SUTURE) IMPLANT
SUT VIC AB 3-0 X1 27 (SUTURE) IMPLANT
SWAB COLLECTION DEVICE MRSA (MISCELLANEOUS) ×2 IMPLANT
SWAB CULTURE ESWAB REG 1ML (MISCELLANEOUS) ×8 IMPLANT
TAPE CLOTH 4X10 WHT NS (GAUZE/BANDAGES/DRESSINGS) ×2 IMPLANT
TAPE CLOTH SURG 4X10 WHT LF (GAUZE/BANDAGES/DRESSINGS) ×2 IMPLANT
TOWEL OR 17X24 6PK STRL BLUE (TOWEL DISPOSABLE) ×4 IMPLANT
TOWEL OR 17X26 10 PK STRL BLUE (TOWEL DISPOSABLE) ×4 IMPLANT
TRAY FOLEY CATH 16FRSI W/METER (SET/KITS/TRAYS/PACK) ×2 IMPLANT
TUBE CONNECTING 12X1/4 (SUCTIONS) IMPLANT
WIRE MAILMAN 182CM (WIRE) ×2 IMPLANT
YANKAUER SUCT BULB TIP NO VENT (SUCTIONS) ×2 IMPLANT

## 2016-06-04 NOTE — H&P (Signed)
Alan Maw, MD at 04/22/2016 11:00 AM   Status: Signed         HPI [name] presents today for an unscheduled visit due to his ICD pending protrusion through the skin. He is a pleasant 70 yo man with a non-ischemic CM, s/p BiV ICD insertion, who underwent ICD generator change out in 2015. He has been stable and his EF actually improved from 20 to 35-40% by echo in 2014. He has not had fever, chills, or night sweats. He has had some discomfort at his ICD insertion site and noticed thinning of his skin over the device. No redness or erythema. He notes that he has received a single ICD shock just after his device was initially placed in 2008. He has a h/o PAF and has refused systemic anti-coagulation. Finally, he has a h/o uncontrolled DM and has refused medical therapy and does not monitor his blood sugar.     Allergies  Allergen Reactions  . Penicillins Hives and Rash           Current Outpatient Prescriptions  Medication Sig Dispense Refill  . carvedilol (COREG) 25 MG tablet TAKE 1 TABLET TWICE DAILY  WITH  MEALS 180 tablet 3  . cholecalciferol (VITAMIN D) 1000 UNITS tablet Take 1,000 Units by mouth daily.    . Cinnamon 500 MG TABS Take 1 tablet by mouth daily.     . digoxin (LANOXIN) 0.125 MG tablet TAKE 1 TABLET EVERY DAY 90 tablet 3  . furosemide (LASIX) 40 MG tablet TAKE 1/2 TABLET TWICE DAILY 90 tablet 3  . lisinopril (PRINIVIL,ZESTRIL) 10 MG tablet TAKE 1 TABLET EVERY DAY 90 tablet 3  . simvastatin (ZOCOR) 40 MG tablet TAKE 1 TABLET AT BEDTIME 90 tablet 3  . glipiZIDE (GLUCOTROL XL) 10 MG 24 hr tablet Take 10 mg by mouth 2 (two) times daily.     No current facility-administered medications for this visit.          Past Medical History:  Diagnosis Date  . Atrial fibrillation (HCC)    Declines anticoagulation  . CAD (coronary artery disease)    Minimal nonobstructive CAD at catheterization 2009  . Chronic systolic heart failure (HCC)   .  Essential hypertension   . Hyperlipidemia   . LBBB (left bundle branch block)   . Myocardial infarction (HCC)   . Secondary cardiomyopathy (HCC)    Nonischemic  . Type 2 diabetes mellitus (HCC)     ROS:   All systems reviewed and negative except as noted in the HPI.        Past Surgical History:  Procedure Laterality Date  . CARDIAC DEFIBRILLATOR PLACEMENT  12/14/2007; 11/23/13   St. Jude Promote RF [id number] ICD placed by Dr. Graciela Husbands for CHF, nonischemic cardiomyopathy; gen change 11/2013 by Dr Johney Frame STJ  . CATARACT EXTRACTION W/PHACO Right 06/14/2013   Procedure: CATARACT EXTRACTION PHACO AND INTRAOCULAR LENS PLACEMENT (IOC);  Surgeon: Gemma Payor, MD;  Location: [facility];  Service: Ophthalmology;  Laterality: Right;  CDE:  21.74  . CATARACT EXTRACTION W/PHACO Left 07/08/2013   Procedure: CATARACT EXTRACTION PHACO AND INTRAOCULAR LENS PLACEMENT (IOC);  Surgeon: Gemma Payor, MD;  Location: [facility];  Service: Ophthalmology;  Laterality: Left;  CDE:24.66  . CHOLECYSTECTOMY    . Fiscula Repair    . Hearing Surgery    . HERNIA REPAIR    . IMPLANTABLE CARDIOVERTER DEFIBRILLATOR GENERATOR CHANGE N/A 11/23/2013   Procedure: IMPLANTABLE CARDIOVERTER DEFIBRILLATOR GENERATOR CHANGE;  Surgeon: Gardiner Rhyme, MD;  Location: [facility];  Service: Cardiovascular;  Laterality: N/A;          Family History  Problem Relation Age of Onset  . Cancer    . Coronary artery disease    . Diabetes       Social History        Social History  . Marital status: Married    Spouse name: N/A  . Number of children: N/A  . Years of education: N/A       Occupational History  . Retired         Social History Main Topics  . Smoking status: Former Smoker    Packs/day: 0.30    Years: 5.00    Types: Cigarettes, Cigars    Start date: 02/15/1959    Quit date: 08/05/1965  . Smokeless tobacco: Never Used  . Alcohol use No  . Drug use: No  . Sexual activity:  Yes    Birth control/ protection: None       Other Topics Concern  . Not on file      Social History Narrative  . No narrative on file     BP 97/64 (BP Location: Right Arm, Patient Position: Sitting, Cuff Size: Normal)   Pulse 67   Temp (!) 95.8 F (35.4 C) (Oral)   Physical Exam:  Well appearing 70 yo man,  HEENT: Unremarkable Neck:  No JVD, no thyromegally Lymphatics:  No adenopathy Back:  No CVA tenderness Lungs:  Clear with no wheezes, thinning of the skin with pending opening over the site in 2 distinct spots on the medial aspect of the incision. The device can be seen thru the paper thin skin. HEART:  Regular rate rhythm, no murmurs, no rubs, no clicks Abd:  soft, positive bowel sounds, no organomegally, no rebound, no guarding Ext:  2 plus pulses, no edema, no cyanosis, no clubbing Skin:  No rashes no nodules Neuro:  CN II through XII intact, motor grossly intact  EKG - nsr (reviewed)  DEVICE  Normal device function.  See PaceArt for details.   Assess/Plan: 1. Pending ICD pocket erosion - I have discussed the situation with the patient. He is not clinically ill. I have scheduled ICD system extraction. He does not appear to be device dependent. He will undergo extraction as our schedule allows. I have discussed the risks/benefits/goals/expectations of the procedure with the patient. He is willing to proceed. 2. Chronic systolic heart failure - he appears to be well controlled. 3. Atrial fib - he is still refusing anti-coagulation. He is mostly in NSR.   Gerhard MunchGregg Kenidi Elenbaas,M.D.     EP Attending  Patient seen and examined. Since my last clinic visit, the patient has seen his primary MD, been given a round of anti-biotics and noted that the area of impending erosion has enlarged. On exam, the pocket demonstrates an enlarging area over the superior portion of the pocket on the incision line where the skin is intact but thinned to a point that I can see the  device through the skin. I have again reviewed the indications for device extraction, and discussed the risks/benefits/goals/expectations of extraction and he wishes to proceed.  Leonia ReevesGregg Jakari Sada,M.D.

## 2016-06-04 NOTE — Op Note (Signed)
EP procedure note  Procedure performed: Extraction of a biventricular ICD  Preoperative diagnosis: Chronic systolic heart failure, status post bi-V ICD insertion with the development of erosion of the ICD pocket  Postoperative diagnosis: Same as preoperative diagnosis  Description of the procedure: After informed consent was obtained, the patient was taken to the operating room in the fasting state. After the usual preparation and draping, the anesthesia service was utilized to provide general anesthesia. Invasive arterial monitoring was carried out with a radial arterial line placed prior to the procedure. A 6 French sheath was inserted percutaneously into the right femoral vein. A 7 cm incision ellipse-shaped incision was carried out over the old ICD insertion site. Electrocautery was utilized to dissect down to the ICD and the generator was removed. The leads were disconnected from the ICD generator. Bacterial cultures were obtained. Electrocautery was utilized to free up the pacing leads. The sewing sleeves were freed up with electrocautery. The pocket was irrigated. Attention was first turned at the left ventricular lead. An angioplasty 014 wire was inserted into the lead body. Gentle traction was then placed on the lead. The lead was removed in total. A stylette was inserted into the right ventricular defibrillator lead and the helix retracted. The helix did not retract into the lead body. A stylette was inserted into the atrial lead and the helix was in fact retracted. At this point a locking stylette was inserted into the atrial lead and gentle traction placed on the lead but it remained fixed in place. A 9 French Cook short RL extraction sheath was advanced over the atrial lead down to the subclavian vein innominate vein junction. At this location the binding site was very severe. The short extraction sheath was removed. The the old ICD lead wasn't targeted. The 9 French Cook short RL extraction  sheath was then advanced over the defibrillator lead and the binding sites were not quite as severe. The short sheath was removed after it had traversed down to the innominate vein. The long 11 French Cook RL extraction sheath was then advanced over the defibrillator lead and using a combination of traction and countertraction, pressure and counter pressure, the defibrillator lead was removed in total. At this point attention was redirected at the atrial lead. The long 11 French Cook sheath was advanced back over the atrial lead and using the same traction and countertraction, pressure and counter pressure, the lead was removed in total. There were no hemodynamic sequelae with removal of the defibrillation system. Electrocautery was utilized to assure hemostasis. The pocket was irrigated with antibiotic irrigation. The incision was closed with multiple Prolene mattress sutures. Pressure was held. Packing was placed in the wound. The bandage was placed. The patient was returned to the recovery area for removal of the endotracheal tube.  Complications: There were no immediate procedural complications.   Conclusion: Successful extraction of a St. Jude biventricular ICD with no immediate procedure complications secondary to pending ICD pocket erosion.  Lewayne Bunting, M.D.

## 2016-06-04 NOTE — Progress Notes (Signed)
Dr. Ladona Ridgel states he needs a right radial arterial line, Charge CRNA notified. Dr. Ladona Ridgel states he will also place Right femoral venous central line.

## 2016-06-04 NOTE — OR Nursing (Signed)
The following devices were removed by Dr. Ladona Ridgel in the OR on 06/04/2016:  Device: Serial Number: [id number]  [id number] A Lead  [id number] RV Lead  [id number] LV Lead [id number]

## 2016-06-04 NOTE — Anesthesia Preprocedure Evaluation (Addendum)
Anesthesia Evaluation   Patient awake    Reviewed: Allergy & Precautions, H&P , NPO status , Patient's Chart, lab work & pertinent test results, reviewed documented beta blocker date and time   Airway Mallampati: II  TM Distance: >3 FB     Dental  (+) Teeth Intact, Chipped   Pulmonary former smoker,    breath sounds clear to auscultation       Cardiovascular hypertension, + CAD, + Past MI, + Peripheral Vascular Disease and +CHF  + dysrhythmias Atrial Fibrillation + Cardiac Defibrillator  Rhythm:Irregular Rate:Normal  Has nonobstructive CAD, NICM with EF of 35%   Neuro/Psych    GI/Hepatic negative GI ROS,   Endo/Other  diabetes, Type 2, Oral Hypoglycemic Agents  Renal/GU      Musculoskeletal   Abdominal   Peds  Hematology   Anesthesia Other Findings   Reproductive/Obstetrics                            Anesthesia Physical  Anesthesia Plan  ASA: III  Anesthesia Plan: General   Post-op Pain Management:    Induction: Intravenous  Airway Management Planned: Oral ETT  Additional Equipment: Arterial line and TEE  Intra-op Plan:   Post-operative Plan: Extubation in OR  Informed Consent: I have reviewed the patients History and Physical, chart, labs and discussed the procedure including the risks, benefits and alternatives for the proposed anesthesia with the patient or authorized representative who has indicated his/her understanding and acceptance.   Dental advisory given  Plan Discussed with: CRNA, Anesthesiologist and Surgeon  Anesthesia Plan Comments:         Anesthesia Quick Evaluation

## 2016-06-04 NOTE — Transfer of Care (Signed)
Immediate Anesthesia Transfer of Care Note  Patient: [name]  Procedure(s) Performed: Procedure(s) with comments: ICD LEAD REMOVAL (N/A) - Owen to back up TRANSESOPHAGEAL ECHOCARDIOGRAM (TEE) (N/A)  Patient Location: PACU  Anesthesia Type:General  Level of Consciousness: awake, alert , oriented and patient cooperative  Airway & Oxygen Therapy: Patient Spontanous Breathing  Post-op Assessment: Report given to RN and Post -op Vital signs reviewed and stable  Post vital signs: Reviewed and stable  Last Vitals:  Vitals:   06/04/16 1116  BP: (!) 110/55  Pulse: 66  Resp: 20  Temp: 36.4 C    Last Pain:  Vitals:   06/04/16 1116  TempSrc: Oral         Complications: No apparent anesthesia complications

## 2016-06-04 NOTE — Progress Notes (Signed)
Patient arrived to 2W03 via wheelchair in no apparent distress.  Telemetry monitor applied and CCMD notified.  Patient oriented to unit and room to include call light and phone.  Will continue to monitor.

## 2016-06-04 NOTE — Anesthesia Postprocedure Evaluation (Signed)
Anesthesia Post Note  Patient: [name]  Procedure(s) Performed: Procedure(s) (LRB): ICD LEAD REMOVAL (N/A) TRANSESOPHAGEAL ECHOCARDIOGRAM (TEE) (N/A)  Patient location during evaluation: PACU Anesthesia Type: General Level of consciousness: awake and alert Pain management: pain level controlled Vital Signs Assessment: post-procedure vital signs reviewed and stable Respiratory status: spontaneous breathing, nonlabored ventilation, respiratory function stable and patient connected to nasal cannula oxygen Cardiovascular status: blood pressure returned to baseline and stable Postop Assessment: no signs of nausea or vomiting Anesthetic complications: no    Last Vitals:  Vitals:   06/04/16 1116 06/04/16 1535  BP: (!) 110/55   Pulse: 66   Resp: 20   Temp: 36.4 C 36.1 C    Last Pain:  Vitals:   06/04/16 1116  TempSrc: Oral                 [name]

## 2016-06-04 NOTE — Anesthesia Procedure Notes (Signed)
Procedure Name: Intubation Date/Time: 06/04/2016 1:31 PM Performed by: Marena Chancy Pre-anesthesia Checklist: Patient identified, Emergency Drugs available, Suction available and Patient being monitored Patient Re-evaluated:Patient Re-evaluated prior to inductionOxygen Delivery Method: Circle System Utilized Preoxygenation: Pre-oxygenation with 100% oxygen Intubation Type: IV induction Ventilation: Mask ventilation without difficulty Tube type: Oral Tube size: 7.5 mm Number of attempts: 1 Airway Equipment and Method: Stylet and Oral airway Placement Confirmation: ETT inserted through vocal cords under direct vision,  positive ETCO2 and breath sounds checked- equal and bilateral Tube secured with: Tape Dental Injury: Teeth and Oropharynx as per pre-operative assessment

## 2016-06-04 NOTE — Consult Note (Addendum)
CARDIOTHORACIC SURGERY OPERATIVE PROCEDURE BACK UP NOTE  Date of Procedure:   06/04/2016  Surgeon:    Salvatore Decent. Cornelius Moras, MD   I was requested by Dr. Lewayne Bunting to be on standby for the pacemaker explant/lead extraction performed on [name] on 06/04/2016. I arrived at the operating room at 13:57 and departed at 14:58    Salvatore Decent. Cornelius Moras MD 06/04/2016 3:06 PM

## 2016-06-05 ENCOUNTER — Observation Stay (HOSPITAL_COMMUNITY): Payer: Commercial Managed Care - HMO

## 2016-06-05 ENCOUNTER — Encounter (HOSPITAL_COMMUNITY): Payer: Self-pay | Admitting: Internal Medicine

## 2016-06-05 DIAGNOSIS — R918 Other nonspecific abnormal finding of lung field: Secondary | ICD-10-CM | POA: Diagnosis not present

## 2016-06-05 DIAGNOSIS — I110 Hypertensive heart disease with heart failure: Secondary | ICD-10-CM | POA: Diagnosis not present

## 2016-06-05 DIAGNOSIS — I5022 Chronic systolic (congestive) heart failure: Secondary | ICD-10-CM | POA: Diagnosis not present

## 2016-06-05 DIAGNOSIS — T827XXS Infection and inflammatory reaction due to other cardiac and vascular devices, implants and grafts, sequela: Secondary | ICD-10-CM

## 2016-06-05 DIAGNOSIS — E1151 Type 2 diabetes mellitus with diabetic peripheral angiopathy without gangrene: Secondary | ICD-10-CM | POA: Diagnosis not present

## 2016-06-05 DIAGNOSIS — I447 Left bundle-branch block, unspecified: Secondary | ICD-10-CM | POA: Diagnosis not present

## 2016-06-05 DIAGNOSIS — I480 Paroxysmal atrial fibrillation: Secondary | ICD-10-CM | POA: Diagnosis not present

## 2016-06-05 DIAGNOSIS — I2510 Atherosclerotic heart disease of native coronary artery without angina pectoris: Secondary | ICD-10-CM | POA: Diagnosis not present

## 2016-06-05 DIAGNOSIS — T827XXA Infection and inflammatory reaction due to other cardiac and vascular devices, implants and grafts, initial encounter: Secondary | ICD-10-CM | POA: Diagnosis not present

## 2016-06-05 DIAGNOSIS — E785 Hyperlipidemia, unspecified: Secondary | ICD-10-CM | POA: Diagnosis not present

## 2016-06-05 DIAGNOSIS — I429 Cardiomyopathy, unspecified: Secondary | ICD-10-CM | POA: Diagnosis not present

## 2016-06-05 LAB — GLUCOSE, CAPILLARY
GLUCOSE-CAPILLARY: 234 mg/dL — AB (ref 65–99)
Glucose-Capillary: 165 mg/dL — ABNORMAL HIGH (ref 65–99)

## 2016-06-05 MED ORDER — CLINDAMYCIN HCL 300 MG PO CAPS: 300.0000 mg | ORAL_CAPSULE | Freq: Three times a day (TID) | ORAL | 0 refills | Status: DC

## 2016-06-05 NOTE — H&P (Signed)
ICD Criteria  Current LVEF:55%. Within 12 months prior to implant: No   Heart failure history: Yes, Class II  Cardiomyopathy history: Yes, Non-Ischemic Cardiomyopathy.  Atrial Fibrillation/Atrial Flutter: No.  Ventricular tachycardia history: No.  Cardiac arrest history: No.  History of syndromes with risk of sudden death: No.  Previous ICD: Yes, Reason for ICD:  Primary prevention.  Current ICD indication: Primary  PPM indication: No.   Class I or II Bradycardia indication present: No  Beta Blocker therapy for 3 or more months: Yes, prescribed.   Ace Inhibitor/ARB therapy for 3 or more months: Yes, prescribed.   This device was implanted 2 years ago and removed due to infection

## 2016-06-05 NOTE — Discharge Instructions (Signed)
Keep incision clean and dry until wound check appointment. Call the office for fevers, chills, drainage, swelling. If you develop diarrhea on antibiotics, stop antibiotics and call the office.

## 2016-06-05 NOTE — Progress Notes (Signed)
Pt discharging home with family.  NP Amber aware of oozing at incision site.  Dressing changed prior to DC and supplies given for home.  All instructions, meds and follow up appts reviewed, all questions answered.

## 2016-06-05 NOTE — Discharge Summary (Signed)
ELECTROPHYSIOLOGY PROCEDURE DISCHARGE SUMMARY    Patient ID: [name],  MRN: [id number], DOB/AGE: 1946-03-15 70 y.o.  Admit date: 06/04/2016 Discharge date: 06/05/2016  Primary Care Physician: [name], MD Primary Cardiologist: [name] Electrophysiologist: Allred  Primary Discharge Diagnosis:  ICD system infection s/p extraction this admission  Secondary Discharge Diagnosis: 1.  Chronic systolic heart failure 2.  LBBB 3.  Diabetes 4.  Hypertension 5.  Hyperlipidemia 6.  Paroxysmal atrial fibrillation - pt declines anticoagulation  Allergies  Allergen Reactions  . Penicillins Hives, Rash and Other (See Comments)    Has patient had a PCN reaction causing immediate rash, facial/tongue/throat swelling, SOB or lightheadedness with hypotension: Yes Has patient had a PCN reaction causing severe rash involving mucus membranes or skin necrosis: No Has patient had a PCN reaction that required hospitalization No Has patient had a PCN reaction occurring within the last 10 years: No If all of the above answers are "NO", then may proceed with Cephalosporin use.     Procedures This Admission:  1.  ICD system extraction on 06/04/16 by Dr Ladona Ridgel. See op note for full details. There were no early apparent complications.   Brief HPI:  [name] is a 70 y.o. male with a past medical history as outlined above. He was seen in the office and was noted to have developed pending erosion of ICD can.  Risks, benefits to ICD system extraction were reviewed with the patient who wished to proceed.    Hospital Course:  The patient was admitted and underwent ICD system extraction. Cultures were sent at the time of procedure.  He was monitored on telemetry which demonstrated sinus rhythm. Left chest packing was removed and redressed prior to discharge.  He was evaluated by Dr Ladona Ridgel and considered stable for discharge to home.  He will be started on Clindamycin at discharge for  10 days.  Follow up has been arranged in the [place] office.   Physical Exam: Vitals:   06/04/16 2036 06/04/16 2317 06/05/16 0156 06/05/16 0609  BP: (!) 107/59 (!) 117/59 (!) 121/56 108/63  Pulse: 74 74 80 88  Resp: 18 20 20 20   Temp: 97.7 F (36.5 C) 98 F (36.7 C) 98.2 F (36.8 C) 97.8 F (36.6 C)  TempSrc: Oral Oral Oral Oral  SpO2: 99% 97% 98% 95%    GEN- The patient is well appearing, alert and oriented x 3 today.   HEENT: normocephalic, atraumatic; sclera clear, conjunctiva pink; hearing intact; oropharynx clear; neck supple  Lungs- Clear to ausculation bilaterally, normal work of breathing.  No wheezes, rales, rhonchi Heart- Regular rate and rhythm  GI- soft, non-tender, non-distended, bowel sounds present  Extremities- no clubbing, cyanosis, or edema  MS- no significant deformity or atrophy Skin- warm and dry, no rash or lesion Psych- euthymic mood, full affect Neuro- strength and sensation are intact    Labs:   Lab Results  Component Value Date   WBC 6.1 06/04/2016   HGB 15.0 06/04/2016   HCT 41.6 06/04/2016   MCV 90.0 06/04/2016   PLT 188 06/04/2016     Recent Labs Lab 06/04/16 1041  NA 136  K 4.3  CL 102  CO2 25  BUN 24*  CREATININE 0.83  CALCIUM 9.1  GLUCOSE 201*     Discharge Medications:  Current Discharge Medication List    START taking these medications   Details  clindamycin (CLEOCIN) 300 MG capsule Take 1 capsule (300 mg total) by mouth 3 (three) times  daily. Qty: 30 capsule, Refills: 0      CONTINUE these medications which have NOT CHANGED   Details  carvedilol (COREG) 25 MG tablet TAKE 1 TABLET TWICE DAILY  WITH  MEALS Qty: 180 tablet, Refills: 3    cholecalciferol (VITAMIN D) 1000 UNITS tablet Take 1,000 Units by mouth daily.    Cinnamon 500 MG TABS Take 500 mg by mouth daily.     digoxin (LANOXIN) 0.125 MG tablet TAKE 1 TABLET EVERY DAY Qty: 90 tablet, Refills: 3    furosemide (LASIX) 40 MG tablet TAKE 1/2 TABLET TWICE  DAILY Qty: 90 tablet, Refills: 3    glipiZIDE (GLUCOTROL XL) 10 MG 24 hr tablet Take 10 mg by mouth 2 (two) times daily.    lisinopril (PRINIVIL,ZESTRIL) 10 MG tablet TAKE 1 TABLET EVERY DAY Qty: 90 tablet, Refills: 3    metFORMIN (GLUCOPHAGE) 500 MG tablet Take 500 mg by mouth every evening.    simvastatin (ZOCOR) 40 MG tablet TAKE 1 TABLET AT BEDTIME Qty: 90 tablet, Refills: 3        Disposition: Pt is being discharged home today in good condition. Discharge Instructions    Diet - low sodium heart healthy    Complete by:  As directed    Increase activity slowly    Complete by:  As directed      Follow-up Information    [facility] [place] Follow up on 06/07/2016.   Specialty:  Cardiology Why:  at 10:30AM for wound check appointment Contact information: [address] [place] [id number] [phone]          Duration of Discharge Encounter: Greater than 30 minutes including physician time.  Signed, Gypsy BalsamAmber Seiler, NP 06/05/2016 8:46 AM  EP Attending  Patient seen and examined. Bandage removed. He is doing well after ICD system extraction. We will plan to DC home on his home meds and clindamycin for 5 days. He will return next week to [place] for suture [place]. I am glad to see him back as needed but would anticipate following his heart function. As he had normalization of his heart function, would hold off on another device unless he had return of LV dysfunction.   Leonia ReevesGregg Suraya Vidrine,M.D.

## 2016-06-06 ENCOUNTER — Encounter (HOSPITAL_COMMUNITY): Payer: Self-pay | Admitting: Cardiology

## 2016-06-07 ENCOUNTER — Ambulatory Visit (INDEPENDENT_AMBULATORY_CARE_PROVIDER_SITE_OTHER): Payer: Commercial Managed Care - HMO | Admitting: Internal Medicine

## 2016-06-07 ENCOUNTER — Encounter: Payer: Self-pay | Admitting: Internal Medicine

## 2016-06-07 VITALS — BP 95/60 | HR 82 | Temp 97.3°F | Ht 72.0 in | Wt 183.6 lb

## 2016-06-07 DIAGNOSIS — T827XXS Infection and inflammatory reaction due to other cardiac and vascular devices, implants and grafts, sequela: Secondary | ICD-10-CM

## 2016-06-07 NOTE — Patient Instructions (Signed)
Medication Instructions:  Continue all current medications.  Labwork: none  Testing/Procedures: none  Follow-Up: 1 week   Any Other Special Instructions Will Be Listed Below (If Applicable).  If you need a refill on your cardiac medications before your next appointment, please call your pharmacy.

## 2016-06-07 NOTE — Progress Notes (Signed)
PCP:  Estanislado PandySASSER,PAUL W, MD Primary Cardiologist:  Diona BrownerMcDowell  The patient presents today for routine electrophysiology followup.  He is recovering nicely from recent BiV ICD system extraction.  Denies fevers, chills, CP, SOB, dizziness, syncope, or other concerns.  Past Medical History:  Diagnosis Date  . AICD (automatic cardioverter/defibrillator) present   . Atrial fibrillation (HCC)    Declines anticoagulation  . CAD (coronary artery disease)    Minimal nonobstructive CAD at catheterization 2009  . Chronic systolic heart failure (HCC)   . Deafness in right ear   . Essential hypertension   . Hyperlipidemia   . LBBB (left bundle branch block)   . Myocardial infarction    "silent heart attack"  . Peripheral vascular disease (HCC)    "poor circulation"  . Pneumonia   . Secondary cardiomyopathy (HCC)    Nonischemic  . Type 2 diabetes mellitus (HCC)    Past Surgical History:  Procedure Laterality Date  . APPENDECTOMY    . CARDIAC DEFIBRILLATOR PLACEMENT  12/14/2007; 11/23/13   St. Jude Promote RF 3207 ICD placed by Dr. Graciela HusbandsKlein for CHF, nonischemic cardiomyopathy; gen change 11/2013 by Dr Johney FrameAllred STJ  . CARDIAC DEFIBRILLATOR REMOVAL  06/04/2016  . CATARACT EXTRACTION W/PHACO Right 06/14/2013   Procedure: CATARACT EXTRACTION PHACO AND INTRAOCULAR LENS PLACEMENT (IOC);  Surgeon: Gemma PayorKerry Hunt, MD;  Location: [facility];  Service: Ophthalmology;  Laterality: Right;  CDE:  21.74  . CATARACT EXTRACTION W/PHACO Left 07/08/2013   Procedure: CATARACT EXTRACTION PHACO AND INTRAOCULAR LENS PLACEMENT (IOC);  Surgeon: Gemma PayorKerry Hunt, MD;  Location: [facility];  Service: Ophthalmology;  Laterality: Left;  CDE:24.66  . CHOLECYSTECTOMY    . Fiscula Repair    . Hearing Surgery    . HERNIA REPAIR    . ICD LEAD REMOVAL N/A 06/04/2016   Procedure: ICD LEAD REMOVAL;  Surgeon: Marinus MawGregg W Taylor, MD;  [facility];  Service: Cardiovascular;  Laterality: N/A;  Owen to back up  . IMPLANTABLE CARDIOVERTER DEFIBRILLATOR  GENERATOR CHANGE N/A 11/23/2013   Procedure: IMPLANTABLE CARDIOVERTER DEFIBRILLATOR GENERATOR CHANGE;  Surgeon: Gardiner RhymeJames D Yaire Kreher, MD;  [facility];  Service: Cardiovascular;  Laterality: N/A;  . TEE WITHOUT CARDIOVERSION N/A 06/04/2016   Procedure: TRANSESOPHAGEAL ECHOCARDIOGRAM (TEE);  Surgeon: Marinus MawGregg W Taylor, MD;  [facility];  Service: Cardiovascular;  Laterality: N/A;    Current Outpatient Prescriptions  Medication Sig Dispense Refill  . carvedilol (COREG) 25 MG tablet TAKE 1 TABLET TWICE DAILY  WITH  MEALS (Patient taking differently: Take 25 mg by mouth twice daily) 180 tablet 3  . cholecalciferol (VITAMIN D) 1000 UNITS tablet Take 1,000 Units by mouth daily.    . Cinnamon 500 MG TABS Take 500 mg by mouth daily.     . clindamycin (CLEOCIN) 300 MG capsule Take 1 capsule (300 mg total) by mouth 3 (three) times daily. 30 capsule 0  . digoxin (LANOXIN) 0.125 MG tablet TAKE 1 TABLET EVERY DAY (Patient taking differently: Take 0.125 mg by mouth daily) 90 tablet 3  . furosemide (LASIX) 40 MG tablet TAKE 1/2 TABLET TWICE DAILY (Patient taking differently: Take 20 mg by mouth twice daily) 90 tablet 3  . glipiZIDE (GLUCOTROL XL) 10 MG 24 hr tablet Take 10 mg by mouth 2 (two) times daily.    Marland Kitchen. lisinopril (PRINIVIL,ZESTRIL) 10 MG tablet TAKE 1 TABLET EVERY DAY (Patient taking differently: Take 10 mg by mouth every day) 90 tablet 3  . metFORMIN (GLUCOPHAGE) 500 MG tablet Take 500 mg by mouth every  evening.    . simvastatin (ZOCOR) 40 MG tablet TAKE 1 TABLET AT BEDTIME (Patient taking differently: Take 40 mg by mouth at bedtime) 90 tablet 3   No current facility-administered medications for this visit.     Allergies  Allergen Reactions  . Penicillins Hives, Rash and Other (See Comments)    Has patient had a PCN reaction causing immediate rash, facial/tongue/throat swelling, SOB or lightheadedness with hypotension: Yes Has patient had a PCN reaction causing severe rash involving mucus  membranes or skin necrosis: No Has patient had a PCN reaction that required hospitalization No Has patient had a PCN reaction occurring within the last 10 years: No If all of the above answers are "NO", then may proceed with Cephalosporin use.     Social History   Social History  . Marital status: Married    Spouse name: N/A  . Number of children: N/A  . Years of education: N/A   Occupational History  . Retired    Social History Main Topics  . Smoking status: Former Smoker    Packs/day: 0.30    Years: 5.00    Types: Cigarettes, Cigars    Start date: 02/15/1959    Quit date: 08/05/1965  . Smokeless tobacco: Never Used  . Alcohol use No  . Drug use: No  . Sexual activity: Yes    Birth control/ protection: None   Other Topics Concern  . Not on file   Social History Narrative  . No narrative on file    Family History  Problem Relation Age of Onset  . Cancer    . Coronary artery disease    . Diabetes        Physical Exam: Vitals:   06/07/16 1037  BP: 95/60  Pulse: 82  Temp: 97.3 F (36.3 C)  SpO2: 99%  Weight: 183 lb 9.6 oz (83.3 kg)  Height: 6' (1.829 m)    GEN- The patient is well appearing, alert and oriented x 3 today.   Head- normocephalic, atraumatic Eyes-  Sclera clear, conjunctiva pink Ears- hearing intact Oropharynx- clear Neck- supple,  Lungs- Clear to ausculation bilaterally, normal work of breathing Chest- ICD pocket is healing nicely s/p extraction,  No drainage, redness, warmth,  Stitches still in place Heart- Regular rate and rhythm, no murmurs, rubs or gallops, PMI not laterally displaced GI- soft, NT, ND, + BS Extremities- no clubbing, cyanosis, or edema Neuro- strength and sensation are intact  ICD interrogation- reviewed in detail today,  See PACEART report  Assessment and Plan:  1. Chronic systolic dysfunction/ ICD system infection S/p BiV ICD system extraction for device pocket infection. Too early to remove stitches  today Return in 1 week for stitch removal He is taking antibiotics per Dr Ladona Ridgel.  Unfortunately, he is not checking his blood sugars.  I suspect his recent device infection stems from very poorly controlled diabetes.  I have informed him that without proper compliance with medical therapy, including diabetes management that risks of device replacement outweigh benefits.     Return in 1 week for stitch removal Will consider repeating echo in 3 months to reassess EF.  Hopefully EF will be ok and we can avoid any further EP procedures on this patient.  2. afib He has been noncompliant with recommendations for anticoagulation   I worry that he has poor insight into his clinical disease and risks associated with nonadherance to medical advise.    Hillis Range MD, [facility] 06/07/2016 11:11 AM

## 2016-06-08 LAB — TYPE AND SCREEN
ABO/RH(D): A POS
ANTIBODY SCREEN: NEGATIVE
UNIT DIVISION: 0
Unit division: 0
Unit division: 0
Unit division: 0

## 2016-06-09 LAB — AEROBIC/ANAEROBIC CULTURE (SURGICAL/DEEP WOUND)

## 2016-06-09 LAB — AEROBIC/ANAEROBIC CULTURE W GRAM STAIN (SURGICAL/DEEP WOUND): Culture: NO GROWTH

## 2016-06-12 ENCOUNTER — Encounter: Payer: Self-pay | Admitting: Internal Medicine

## 2016-06-12 ENCOUNTER — Ambulatory Visit (INDEPENDENT_AMBULATORY_CARE_PROVIDER_SITE_OTHER): Payer: Commercial Managed Care - HMO | Admitting: Internal Medicine

## 2016-06-12 VITALS — BP 104/62 | HR 88 | Ht 72.0 in | Wt 182.2 lb

## 2016-06-12 DIAGNOSIS — I429 Cardiomyopathy, unspecified: Secondary | ICD-10-CM

## 2016-06-12 DIAGNOSIS — I420 Dilated cardiomyopathy: Secondary | ICD-10-CM

## 2016-06-12 DIAGNOSIS — Z95810 Presence of automatic (implantable) cardiac defibrillator: Secondary | ICD-10-CM

## 2016-06-12 DIAGNOSIS — I5022 Chronic systolic (congestive) heart failure: Secondary | ICD-10-CM

## 2016-06-12 NOTE — Progress Notes (Signed)
PCP:  Estanislado PandySASSER,PAUL W, MD Primary Cardiologist:  Diona BrownerMcDowell  The patient presents today for routine electrophysiology followup.  He is recovering nicely from recent BiV ICD system extraction.  Denies fevers, chills, CP, SOB, dizziness, syncope, or other concerns.  Past Medical History:  Diagnosis Date  . AICD (automatic cardioverter/defibrillator) present   . Atrial fibrillation (HCC)    Declines anticoagulation  . CAD (coronary artery disease)    Minimal nonobstructive CAD at catheterization 2009  . Chronic systolic heart failure (HCC)   . Deafness in right ear   . Essential hypertension   . Hyperlipidemia   . LBBB (left bundle branch block)   . Myocardial infarction    "silent heart attack"  . Peripheral vascular disease (HCC)    "poor circulation"  . Pneumonia   . Secondary cardiomyopathy (HCC)    Nonischemic  . Type 2 diabetes mellitus (HCC)    Past Surgical History:  Procedure Laterality Date  . APPENDECTOMY    . CARDIAC DEFIBRILLATOR PLACEMENT  12/14/2007; 11/23/13   St. Jude Promote RF 3207 ICD placed by Dr. Graciela HusbandsKlein for CHF, nonischemic cardiomyopathy; gen change 11/2013 by Dr Johney FrameAllred STJ  . CARDIAC DEFIBRILLATOR REMOVAL  06/04/2016  . CATARACT EXTRACTION W/PHACO Right 06/14/2013   Procedure: CATARACT EXTRACTION PHACO AND INTRAOCULAR LENS PLACEMENT (IOC);  Surgeon: Gemma PayorKerry Hunt, MD;  Location: [facility];  Service: Ophthalmology;  Laterality: Right;  CDE:  21.74  . CATARACT EXTRACTION W/PHACO Left 07/08/2013   Procedure: CATARACT EXTRACTION PHACO AND INTRAOCULAR LENS PLACEMENT (IOC);  Surgeon: Gemma PayorKerry Hunt, MD;  Location: [facility];  Service: Ophthalmology;  Laterality: Left;  CDE:24.66  . CHOLECYSTECTOMY    . Fiscula Repair    . Hearing Surgery    . HERNIA REPAIR    . ICD LEAD REMOVAL N/A 06/04/2016   Procedure: ICD LEAD REMOVAL;  Surgeon: Marinus MawGregg W Taylor, MD;  [facility];  Service: Cardiovascular;  Laterality: N/A;  Owen to back up  . IMPLANTABLE CARDIOVERTER DEFIBRILLATOR  GENERATOR CHANGE N/A 11/23/2013   Procedure: IMPLANTABLE CARDIOVERTER DEFIBRILLATOR GENERATOR CHANGE;  Surgeon: Gardiner RhymeJames D Joanne Brander, MD;  [facility];  Service: Cardiovascular;  Laterality: N/A;  . TEE WITHOUT CARDIOVERSION N/A 06/04/2016   Procedure: TRANSESOPHAGEAL ECHOCARDIOGRAM (TEE);  Surgeon: Marinus MawGregg W Taylor, MD;  [facility];  Service: Cardiovascular;  Laterality: N/A;    Current Outpatient Prescriptions  Medication Sig Dispense Refill  . carvedilol (COREG) 25 MG tablet TAKE 1 TABLET TWICE DAILY  WITH  MEALS (Patient taking differently: Take 25 mg by mouth twice daily) 180 tablet 3  . cholecalciferol (VITAMIN D) 1000 UNITS tablet Take 1,000 Units by mouth daily.    . Cinnamon 500 MG TABS Take 500 mg by mouth daily.     . clindamycin (CLEOCIN) 300 MG capsule Take 1 capsule (300 mg total) by mouth 3 (three) times daily. 30 capsule 0  . digoxin (LANOXIN) 0.125 MG tablet TAKE 1 TABLET EVERY DAY (Patient taking differently: Take 0.125 mg by mouth daily) 90 tablet 3  . furosemide (LASIX) 40 MG tablet TAKE 1/2 TABLET TWICE DAILY (Patient taking differently: Take 20 mg by mouth twice daily) 90 tablet 3  . glipiZIDE (GLUCOTROL XL) 10 MG 24 hr tablet Take 10 mg by mouth 2 (two) times daily.    Marland Kitchen. lisinopril (PRINIVIL,ZESTRIL) 10 MG tablet TAKE 1 TABLET EVERY DAY (Patient taking differently: Take 10 mg by mouth every day) 90 tablet 3  . metFORMIN (GLUCOPHAGE) 500 MG tablet Take 500 mg by mouth every  evening.    . simvastatin (ZOCOR) 40 MG tablet TAKE 1 TABLET AT BEDTIME (Patient taking differently: Take 40 mg by mouth at bedtime) 90 tablet 3   No current facility-administered medications for this visit.     Allergies  Allergen Reactions  . Penicillins Hives, Rash and Other (See Comments)    Has patient had a PCN reaction causing immediate rash, facial/tongue/throat swelling, SOB or lightheadedness with hypotension: Yes Has patient had a PCN reaction causing severe rash involving mucus  membranes or skin necrosis: No Has patient had a PCN reaction that required hospitalization No Has patient had a PCN reaction occurring within the last 10 years: No If all of the above answers are "NO", then may proceed with Cephalosporin use.     Social History   Social History  . Marital status: Married    Spouse name: N/A  . Number of children: N/A  . Years of education: N/A   Occupational History  . Retired    Social History Main Topics  . Smoking status: Former Smoker    Packs/day: 0.30    Years: 5.00    Types: Cigarettes, Cigars    Start date: 02/15/1959    Quit date: 08/05/1965  . Smokeless tobacco: Never Used  . Alcohol use No  . Drug use: No  . Sexual activity: Yes    Birth control/ protection: None   Other Topics Concern  . Not on file   Social History Narrative  . No narrative on file    Family History  Problem Relation Age of Onset  . Cancer    . Coronary artery disease    . Diabetes        Physical Exam: Vitals:   06/12/16 1002  BP: 104/62  Pulse: 88  SpO2: 98%  Weight: 182 lb 3.2 oz (82.6 kg)  Height: 6' (1.829 m)    GEN- The patient is well appearing, alert and oriented x 3 today.   Head- normocephalic, atraumatic Eyes-  Sclera clear, conjunctiva pink Ears- hearing intact Oropharynx- clear Neck- supple,  Lungs- Clear to ausculation bilaterally, normal work of breathing Chest- ICD pocket is healing nicely s/p extraction,  No drainage, redness, warmth,  Stitches removed today Heart- Regular rate and rhythm, no murmurs, rubs or gallops, PMI not laterally displaced GI- soft, NT, ND, + BS Extremities- no clubbing, cyanosis, or edema Neuro- strength and sensation are intact  Assessment and Plan:  1. Chronic systolic dysfunction/ ICD system infection S/p BiV ICD system extraction for device pocket infection. Stitches removed today  Unfortunately, he is not checking his blood sugars.  I suspect his recent device infection stems from very  poorly controlled diabetes.  I have informed him that without proper compliance with medical therapy, including diabetes management that risks of device replacement outweigh benefits.  I will see back in 3 months.  Repeat echo at that time.  Hopefully, we can avoid additional devices in the future.  His risks of repeat infection are high.  2. afib He has been noncompliant with recommendations for anticoagulation  Follow-up for medical management of CHF with Dr [name] Return to see me in 3 months with an echo   I worry that he has poor insight into his clinical disease and risks associated with nonadherance to medical advise.    Hillis Range MD, [facility] 06/12/2016 10:46 AM

## 2016-06-12 NOTE — Patient Instructions (Addendum)
Medication Instructions:  Continue all current medications.  Labwork: none  Testing/Procedures:  Your physician has requested that you have an echocardiogram. Echocardiography is a painless test that uses sound waves to create images of your heart. It provides your doctor with information about the size and shape of your heart and how well your heart's chambers and valves are working. This procedure takes approximately one hour. There are no restrictions for this procedure. - Due just prior to next office visit with Dr. Johney Frame.   Office will contact with results via phone or letter.    Follow-Up: 3 months - Dr. Johney Frame. 4-6 weeks - Dr. [name]   Any Other Special Instructions Will Be Listed Below (If Applicable).  If you need a refill on your cardiac medications before your next appointment, please call your pharmacy.

## 2016-06-12 NOTE — Addendum Note (Signed)
Addended by: Lesle Chris on: 06/12/2016 10:55 AM   Modules accepted: Orders

## 2016-06-14 ENCOUNTER — Ambulatory Visit: Payer: Commercial Managed Care - HMO | Admitting: Internal Medicine

## 2016-06-22 ENCOUNTER — Ambulatory Visit: Payer: Commercial Managed Care - HMO | Admitting: Internal Medicine

## 2016-07-24 ENCOUNTER — Ambulatory Visit: Payer: Commercial Managed Care - HMO | Admitting: Cardiology

## 2016-08-23 ENCOUNTER — Ambulatory Visit: Payer: Commercial Managed Care - HMO | Admitting: Cardiology

## 2016-09-02 ENCOUNTER — Telehealth: Payer: Self-pay | Admitting: Internal Medicine

## 2016-09-02 NOTE — Telephone Encounter (Signed)
Pt aware to keep 2/1 appt with Dr. Johney Frame has echo scheduled for 1/29 - pt will address labs with Dr. Johney Frame on Friday

## 2016-09-02 NOTE — Telephone Encounter (Signed)
Would like to know if he still needs his appt on Sep 06, 2016 with Dr Johney Frame.  Also, asking about labs in refrerence to cause of his infection.  Stated he was never told what the cause was.

## 2016-09-04 ENCOUNTER — Ambulatory Visit (INDEPENDENT_AMBULATORY_CARE_PROVIDER_SITE_OTHER): Payer: Medicare HMO

## 2016-09-04 ENCOUNTER — Other Ambulatory Visit: Payer: Self-pay

## 2016-09-04 DIAGNOSIS — I5022 Chronic systolic (congestive) heart failure: Secondary | ICD-10-CM

## 2016-09-04 DIAGNOSIS — I420 Dilated cardiomyopathy: Secondary | ICD-10-CM

## 2016-09-04 DIAGNOSIS — I429 Cardiomyopathy, unspecified: Secondary | ICD-10-CM | POA: Diagnosis not present

## 2016-09-06 ENCOUNTER — Ambulatory Visit (INDEPENDENT_AMBULATORY_CARE_PROVIDER_SITE_OTHER): Payer: Medicare HMO | Admitting: Internal Medicine

## 2016-09-06 ENCOUNTER — Encounter: Payer: Self-pay | Admitting: Internal Medicine

## 2016-09-06 VITALS — BP 112/70 | HR 74 | Ht 72.0 in | Wt 184.0 lb

## 2016-09-06 DIAGNOSIS — I420 Dilated cardiomyopathy: Secondary | ICD-10-CM

## 2016-09-06 DIAGNOSIS — I429 Cardiomyopathy, unspecified: Secondary | ICD-10-CM | POA: Diagnosis not present

## 2016-09-06 DIAGNOSIS — I480 Paroxysmal atrial fibrillation: Secondary | ICD-10-CM | POA: Diagnosis not present

## 2016-09-06 DIAGNOSIS — I5022 Chronic systolic (congestive) heart failure: Secondary | ICD-10-CM | POA: Diagnosis not present

## 2016-09-06 NOTE — Progress Notes (Signed)
PCP:  [name], MD Primary Cardiologist:  [name]  The patient presents today for routine electrophysiology followup.  He is recovering nicely from recent BiV ICD system extraction.  Denies fevers, chills, CP, SOB, dizziness, syncope, or other concerns.  Remains active but is still not following Dr Dian Situ treatment for his diabetes.  Past Medical History:  Diagnosis Date  . AICD (automatic cardioverter/defibrillator) present   . Atrial fibrillation (HCC)    Declines anticoagulation  . CAD (coronary artery disease)    Minimal nonobstructive CAD at catheterization 2009  . Chronic systolic heart failure (HCC)   . Deafness in right ear   . Essential hypertension   . Hyperlipidemia   . LBBB (left bundle branch block)   . Myocardial infarction    "silent heart attack"  . Peripheral vascular disease (HCC)    "poor circulation"  . Pneumonia   . Secondary cardiomyopathy (HCC)    Nonischemic  . Type 2 diabetes mellitus (HCC)    Past Surgical History:  Procedure Laterality Date  . APPENDECTOMY    . CARDIAC DEFIBRILLATOR PLACEMENT  12/14/2007; 11/23/13   St. Jude Promote RF 3207 ICD placed by Dr. Graciela Husbands for CHF, nonischemic cardiomyopathy; gen change 11/2013 by Dr Johney Frame STJ  . CARDIAC DEFIBRILLATOR REMOVAL  06/04/2016  . CATARACT EXTRACTION W/PHACO Right 06/14/2013   Procedure: CATARACT EXTRACTION PHACO AND INTRAOCULAR LENS PLACEMENT (IOC);  Surgeon: Gemma Payor, MD;  Location: [facility];  Service: Ophthalmology;  Laterality: Right;  CDE:  21.74  . CATARACT EXTRACTION W/PHACO Left 07/08/2013   Procedure: CATARACT EXTRACTION PHACO AND INTRAOCULAR LENS PLACEMENT (IOC);  Surgeon: Gemma Payor, MD;  Location: [facility];  Service: Ophthalmology;  Laterality: Left;  CDE:24.66  . CHOLECYSTECTOMY    . Fiscula Repair    . Hearing Surgery    . HERNIA REPAIR    . ICD LEAD REMOVAL N/A 06/04/2016   Procedure: ICD LEAD REMOVAL;  Surgeon: Marinus Maw, MD;  Location: [facility] OR;  Service: Cardiovascular;   Laterality: N/A;  Owen to back up  . IMPLANTABLE CARDIOVERTER DEFIBRILLATOR GENERATOR CHANGE N/A 11/23/2013   Procedure: IMPLANTABLE CARDIOVERTER DEFIBRILLATOR GENERATOR CHANGE;  Surgeon: Gardiner Rhyme, MD;  Location: [facility] CATH LAB;  Service: Cardiovascular;  Laterality: N/A;  . TEE WITHOUT CARDIOVERSION N/A 06/04/2016   Procedure: TRANSESOPHAGEAL ECHOCARDIOGRAM (TEE);  Surgeon: Marinus Maw, MD;  Location: [facility] OR;  Service: Cardiovascular;  Laterality: N/A;    Current Outpatient Prescriptions  Medication Sig Dispense Refill  . carvedilol (COREG) 25 MG tablet TAKE 1 TABLET TWICE DAILY  WITH  MEALS (Patient taking differently: Take 25 mg by mouth twice daily) 180 tablet 3  . cholecalciferol (VITAMIN D) 1000 UNITS tablet Take 1,000 Units by mouth daily.    . Cinnamon 500 MG TABS Take 500 mg by mouth daily.     . digoxin (LANOXIN) 0.125 MG tablet TAKE 1 TABLET EVERY DAY (Patient taking differently: Take 0.125 mg by mouth daily) 90 tablet 3  . furosemide (LASIX) 40 MG tablet TAKE 1/2 TABLET TWICE DAILY (Patient taking differently: Take 20 mg by mouth twice daily) 90 tablet 3  . glipiZIDE (GLUCOTROL XL) 10 MG 24 hr tablet Take 10 mg by mouth 2 (two) times daily.    Marland Kitchen lisinopril (PRINIVIL,ZESTRIL) 10 MG tablet TAKE 1 TABLET EVERY DAY (Patient taking differently: Take 10 mg by mouth every day) 90 tablet 3  . metFORMIN (GLUCOPHAGE) 500 MG tablet Take 500 mg by mouth every evening.    . simvastatin (ZOCOR) 40  MG tablet TAKE 1 TABLET AT BEDTIME (Patient taking differently: Take 40 mg by mouth at bedtime) 90 tablet 3   No current facility-administered medications for this visit.     Allergies  Allergen Reactions  . Penicillins Hives, Rash and Other (See Comments)    Has patient had a PCN reaction causing immediate rash, facial/tongue/throat swelling, SOB or lightheadedness with hypotension: Yes Has patient had a PCN reaction causing severe rash involving mucus membranes or skin necrosis: No Has  patient had a PCN reaction that required hospitalization No Has patient had a PCN reaction occurring within the last 10 years: No If all of the above answers are "NO", then may proceed with Cephalosporin use.     Social History   Social History  . Marital status: Married    Spouse name: N/A  . Number of children: N/A  . Years of education: N/A   Occupational History  . Retired    Social History Main Topics  . Smoking status: Former Smoker    Packs/day: 0.30    Years: 5.00    Types: Cigarettes, Cigars    Start date: 02/15/1959    Quit date: 08/05/1965  . Smokeless tobacco: Never Used  . Alcohol use No  . Drug use: No  . Sexual activity: Yes    Birth control/ protection: None   Other Topics Concern  . Not on file   Social History Narrative  . No narrative on file    Family History  Problem Relation Age of Onset  . Cancer    . Coronary artery disease    . Diabetes        Physical Exam: Vitals:   09/06/16 1159  BP: 112/70  Pulse: 74  SpO2: 95%  Weight: 184 lb (83.5 kg)  Height: 6' (1.829 m)    GEN- The patient is well appearing, alert and oriented x 3 today.   Head- normocephalic, atraumatic Eyes-  Sclera clear, conjunctiva pink Ears- hearing intact Oropharynx- clear Neck- supple,  Lungs- Clear to ausculation bilaterally, normal work of breathing Chest- ICD pocket is healing nicely s/p extraction,  No drainage, redness, warmth,  Stitches removed today Heart- Regular rate and rhythm, no murmurs, rubs or gallops, PMI not laterally displaced GI- soft, NT, ND, + BS Extremities- no clubbing, cyanosis, or edema Neuro- strength and sensation are intact  Echo 06/04/17 reveals EF 40-45%  Assessment and Plan:  1. Chronic systolic dysfunction/ ICD system infection S/p BiV ICD system extraction for device pocket infection. EF has recovered to 40-45%.  I would therefore NOT recommend device reimplantation at this time. Aggressive lifestyle modification,  treatment of diabetes, and regular activity are encouraged  Medical therapy by Dr [name] going forward  2. afib He has been noncompliant with recommendations for anticoagulation  Follow-up for medical management of CHF and afib with Dr [name]   I will see as needed going forward  Hillis Range MD, [facility] 09/06/2016 12:17 PM

## 2016-09-06 NOTE — Patient Instructions (Signed)
Medication Instructions:  Continue all current medications.  Labwork: none  Testing/Procedures: none  Follow-Up: As needed with Dr. Johney Frame.  Any Other Special Instructions Will Be Listed Below (If Applicable). Next available with Dr. [name].   If you need a refill on your cardiac medications before your next appointment, please call your pharmacy.

## 2016-09-12 ENCOUNTER — Other Ambulatory Visit: Payer: Self-pay | Admitting: Cardiology

## 2016-10-11 ENCOUNTER — Encounter: Payer: Self-pay | Admitting: *Deleted

## 2016-10-14 ENCOUNTER — Ambulatory Visit (INDEPENDENT_AMBULATORY_CARE_PROVIDER_SITE_OTHER): Payer: Medicare HMO | Admitting: Cardiology

## 2016-10-14 ENCOUNTER — Encounter: Payer: Self-pay | Admitting: Cardiology

## 2016-10-14 VITALS — BP 92/62 | HR 92 | Ht 72.0 in | Wt 181.0 lb

## 2016-10-14 DIAGNOSIS — Z95810 Presence of automatic (implantable) cardiac defibrillator: Secondary | ICD-10-CM

## 2016-10-14 DIAGNOSIS — I428 Other cardiomyopathies: Secondary | ICD-10-CM | POA: Diagnosis not present

## 2016-10-14 DIAGNOSIS — I10 Essential (primary) hypertension: Secondary | ICD-10-CM | POA: Diagnosis not present

## 2016-10-14 DIAGNOSIS — I480 Paroxysmal atrial fibrillation: Secondary | ICD-10-CM | POA: Diagnosis not present

## 2016-10-14 NOTE — Patient Instructions (Signed)

## 2016-10-14 NOTE — Progress Notes (Signed)
Cardiology Office Note  Date: 10/14/2016   ID: [name], DOB 24-Mar-1946, MRN [id number]  PCP: [name], MD  Primary Cardiologist: Nona Dell, MD   Chief Complaint  Patient presents with  . Cardiomyopathy    History of Present Illness: [name] is a 71 y.o. male that I have not seen in the office since November 2016. He has had interval follow-up through the device clinic with Dr. Johney Frame. He had a St. Jude biventricular ICD in place which ultimately became infected and associated with pocket erosion. This device was extracted by Dr. Ladona Ridgel in November 2017. Since that time he had a follow-up echocardiogram in January of this year that revealed LVEF 40-45% range. No device reimplantation has been planned at this point by Dr. Johney Frame.  He comes in today with multiple somatic complaints, somewhat unusual affect. States that he has not seen his PCP Dr. Neita Carp recently. He reports generalized fatigue, tingling in his legs at times, difficulty hearing with sinus congestion, trouble sleeping, left-sided inguinal hernia. No fevers or chills.  I reviewed his medications which are outlined below.  Past Medical History:  Diagnosis Date  . Atrial fibrillation (HCC)    Declines anticoagulation  . CAD (coronary artery disease)    Minimal nonobstructive CAD at catheterization 2009  . Chronic systolic heart failure (HCC)   . Deafness in right ear   . Essential hypertension   . Hyperlipidemia   . Infection involving implantable cardioverter-defibrillator (ICD) [facility])    St. Jude biventricular ICD, device extraction November 2017 - Dr. Ladona Ridgel  . LBBB (left bundle branch block)   . Nonischemic cardiomyopathy (HCC)   . Pneumonia   . Type 2 diabetes mellitus (HCC)     Past Surgical History:  Procedure Laterality Date  . APPENDECTOMY    . CARDIAC DEFIBRILLATOR PLACEMENT  12/14/2007; 11/23/13   St. Jude Promote RF 3207 ICD placed by Dr. Graciela Husbands for CHF, nonischemic  cardiomyopathy; gen change 11/2013 by Dr Johney Frame STJ  . CARDIAC DEFIBRILLATOR REMOVAL  06/04/2016  . CATARACT EXTRACTION W/PHACO Right 06/14/2013   Procedure: CATARACT EXTRACTION PHACO AND INTRAOCULAR LENS PLACEMENT (IOC);  Surgeon: Gemma Payor, MD;  Location: [facility];  Service: Ophthalmology;  Laterality: Right;  CDE:  21.74  . CATARACT EXTRACTION W/PHACO Left 07/08/2013   Procedure: CATARACT EXTRACTION PHACO AND INTRAOCULAR LENS PLACEMENT (IOC);  Surgeon: Gemma Payor, MD;  Location: [facility];  Service: Ophthalmology;  Laterality: Left;  CDE:24.66  . CHOLECYSTECTOMY    . Fiscula Repair    . Hearing Surgery    . HERNIA REPAIR    . ICD LEAD REMOVAL N/A 06/04/2016   Procedure: ICD LEAD REMOVAL;  Surgeon: Marinus Maw, MD;  Location: [facility] OR;  Service: Cardiovascular;  Laterality: N/A;  Owen to back up  . IMPLANTABLE CARDIOVERTER DEFIBRILLATOR GENERATOR CHANGE N/A 11/23/2013   Procedure: IMPLANTABLE CARDIOVERTER DEFIBRILLATOR GENERATOR CHANGE;  Surgeon: Gardiner Rhyme, MD;  Location: [facility] CATH LAB;  Service: Cardiovascular;  Laterality: N/A;  . TEE WITHOUT CARDIOVERSION N/A 06/04/2016   Procedure: TRANSESOPHAGEAL ECHOCARDIOGRAM (TEE);  Surgeon: Marinus Maw, MD;  Location: [facility] OR;  Service: Cardiovascular;  Laterality: N/A;    Current Outpatient Prescriptions  Medication Sig Dispense Refill  . carvedilol (COREG) 25 MG tablet TAKE 1 TABLET TWICE DAILY  WITH  MEALS 180 tablet 3  . cholecalciferol (VITAMIN D) 1000 UNITS tablet Take 1,000 Units by mouth daily.    . Cinnamon 500 MG TABS Take 500 mg by mouth daily.     Marland Kitchen  digoxin (LANOXIN) 0.125 MG tablet TAKE 1 TABLET EVERY DAY 90 tablet 3  . furosemide (LASIX) 40 MG tablet TAKE 1/2 TABLET TWICE DAILY 90 tablet 3  . glipiZIDE (GLUCOTROL XL) 10 MG 24 hr tablet Take 10 mg by mouth 2 (two) times daily.    Marland Kitchen lisinopril (PRINIVIL,ZESTRIL) 10 MG tablet TAKE 1 TABLET EVERY DAY 90 tablet 3  . metFORMIN (GLUCOPHAGE) 500 MG tablet Take 500 mg by mouth every evening.     . simvastatin (ZOCOR) 40 MG tablet TAKE 1 TABLET AT BEDTIME 90 tablet 3   No current facility-administered medications for this visit.    Allergies:  Penicillins   Social History: The patient  reports that he quit smoking about 51 years ago. His smoking use included Cigarettes and Cigars. He started smoking about 57 years ago. He has a 1.50 pack-year smoking history. He has never used smokeless tobacco. He reports that he does not drink alcohol or use drugs.   ROS:  Please see the history of present illness. Otherwise, complete review of systems is positive for none.  All other systems are reviewed and negative.   Physical Exam: VS:  BP 92/62   Pulse 92   Ht 6' (1.829 m)   Wt 181 lb (82.1 kg)   SpO2 96%   BMI 24.55 kg/m , BMI Body mass index is 24.55 kg/m.  Wt Readings from Last 3 Encounters:  10/14/16 181 lb (82.1 kg)  09/06/16 184 lb (83.5 kg)  06/12/16 182 lb 3.2 oz (82.6 kg)    General: Elderly male, appears comfortable at rest. HEENT: Conjunctiva and lids normal, oropharynx clear. Neck: Supple, no elevated JVP or carotid bruits, no thyromegaly. Lungs: Clear to auscultation, nonlabored breathing at rest. Cardiac: Regular rate and rhythm, no S3, soft systolic murmur, no pericardial rub. Abdomen: Soft, nontender, bowel sounds present. Extremities: No pitting edema, distal pulses 2+. Skin: Warm and dry. Musculoskeletal: No kyphosis. Neuropsychiatric: Alert and oriented x3, diminished hearing.  ECG: I personally reviewed the tracing from 06/05/2016 which showed sinus rhythm with left bundle-branch block and PVCs.  Recent Labwork: 06/04/2016: BUN 24; Creatinine, Ser 0.83; Hemoglobin 15.0; Platelets 188; Potassium 4.3; Sodium 136   Other Studies Reviewed Today:  Echocardiogram 09/04/2016: Study Conclusions  - Left ventricle: The cavity size was normal. Wall thickness was   normal. Systolic function was mildly to moderately reduced. The   estimated ejection fraction was  in the range of 40% to 45%.   Doppler parameters are consistent with abnormal left ventricular   relaxation (grade 1 diastolic dysfunction). - Aortic valve: There was mild regurgitation. Valve area (VTI):   2.39 cm^2. Valve area (Vmax): 2.29 cm^2. Valve area (Vmean): 2.1   cm^2. - Mitral valve: There was mild regurgitation. - Technically adequate study.  Assessment and Plan:  1. Nonischemic cardiopathy with LVEF 40-45%. Somewhat difficult to get a good sense of his functional capacity as he has multiple somatic complaints. Current regimen includes Coreg, Lanoxin, Lasix, and lisinopril. Blood pressure limits further up titration. Would continue medical therapy. I recommended that he follow-up within the next 4 months and that we eventually consider an echocardiogram to make sure LVEF is stable. He states that he does not want to have frequent visits and will be seen in a year.  2. History of paroxysmal atrial fibrillation. He declines anticoagulation.  3. History of St. Jude biventricular ICD with infection and pocket erosion, status post extraction by Dr. Ladona Ridgel in November 2017.  4. History of hypertension, blood  pressure low normal today.  Current medicines were reviewed with the patient today.  Disposition: Follow-up in one year at patient request.  Signed, Jonelle Sidle, MD, [facility] 10/14/2016 1:57 PM    [facility] [address] [place], [place], [place] [address] Phone: [phone]; Fax: [phone]

## 2017-07-24 ENCOUNTER — Other Ambulatory Visit: Payer: Self-pay | Admitting: Cardiology

## 2017-10-28 ENCOUNTER — Ambulatory Visit: Payer: Medicare HMO | Admitting: Cardiology

## 2018-07-24 ENCOUNTER — Encounter (HOSPITAL_COMMUNITY): Payer: Self-pay | Admitting: Emergency Medicine

## 2018-07-24 ENCOUNTER — Other Ambulatory Visit: Payer: Self-pay

## 2018-07-24 ENCOUNTER — Inpatient Hospital Stay (HOSPITAL_COMMUNITY)
Admission: EM | Admit: 2018-07-24 | Discharge: 2018-08-05 | DRG: 291 | Disposition: E | Payer: Medicare HMO | Attending: Internal Medicine | Admitting: Internal Medicine

## 2018-07-24 ENCOUNTER — Emergency Department (HOSPITAL_COMMUNITY): Payer: Medicare HMO

## 2018-07-24 DIAGNOSIS — E785 Hyperlipidemia, unspecified: Secondary | ICD-10-CM | POA: Diagnosis present

## 2018-07-24 DIAGNOSIS — I130 Hypertensive heart and chronic kidney disease with heart failure and stage 1 through stage 4 chronic kidney disease, or unspecified chronic kidney disease: Secondary | ICD-10-CM | POA: Diagnosis present

## 2018-07-24 DIAGNOSIS — D649 Anemia, unspecified: Secondary | ICD-10-CM | POA: Diagnosis not present

## 2018-07-24 DIAGNOSIS — R112 Nausea with vomiting, unspecified: Secondary | ICD-10-CM | POA: Diagnosis present

## 2018-07-24 DIAGNOSIS — R945 Abnormal results of liver function studies: Secondary | ICD-10-CM

## 2018-07-24 DIAGNOSIS — R570 Cardiogenic shock: Principal | ICD-10-CM

## 2018-07-24 DIAGNOSIS — R011 Cardiac murmur, unspecified: Secondary | ICD-10-CM | POA: Diagnosis present

## 2018-07-24 DIAGNOSIS — Z79899 Other long term (current) drug therapy: Secondary | ICD-10-CM

## 2018-07-24 DIAGNOSIS — N179 Acute kidney failure, unspecified: Secondary | ICD-10-CM | POA: Diagnosis not present

## 2018-07-24 DIAGNOSIS — H9191 Unspecified hearing loss, right ear: Secondary | ICD-10-CM | POA: Diagnosis present

## 2018-07-24 DIAGNOSIS — L8990 Pressure ulcer of unspecified site, unspecified stage: Secondary | ICD-10-CM

## 2018-07-24 DIAGNOSIS — D631 Anemia in chronic kidney disease: Secondary | ICD-10-CM | POA: Diagnosis present

## 2018-07-24 DIAGNOSIS — Z515 Encounter for palliative care: Secondary | ICD-10-CM | POA: Diagnosis not present

## 2018-07-24 DIAGNOSIS — S4992XA Unspecified injury of left shoulder and upper arm, initial encounter: Secondary | ICD-10-CM | POA: Diagnosis not present

## 2018-07-24 DIAGNOSIS — I428 Other cardiomyopathies: Secondary | ICD-10-CM | POA: Diagnosis not present

## 2018-07-24 DIAGNOSIS — Z8249 Family history of ischemic heart disease and other diseases of the circulatory system: Secondary | ICD-10-CM

## 2018-07-24 DIAGNOSIS — I5022 Chronic systolic (congestive) heart failure: Secondary | ICD-10-CM | POA: Diagnosis present

## 2018-07-24 DIAGNOSIS — I5043 Acute on chronic combined systolic (congestive) and diastolic (congestive) heart failure: Secondary | ICD-10-CM | POA: Diagnosis present

## 2018-07-24 DIAGNOSIS — K4020 Bilateral inguinal hernia, without obstruction or gangrene, not specified as recurrent: Secondary | ICD-10-CM | POA: Diagnosis present

## 2018-07-24 DIAGNOSIS — Z7189 Other specified counseling: Secondary | ICD-10-CM | POA: Diagnosis not present

## 2018-07-24 DIAGNOSIS — E1122 Type 2 diabetes mellitus with diabetic chronic kidney disease: Secondary | ICD-10-CM | POA: Diagnosis present

## 2018-07-24 DIAGNOSIS — I2510 Atherosclerotic heart disease of native coronary artery without angina pectoris: Secondary | ICD-10-CM | POA: Diagnosis present

## 2018-07-24 DIAGNOSIS — E875 Hyperkalemia: Secondary | ICD-10-CM | POA: Diagnosis not present

## 2018-07-24 DIAGNOSIS — J9811 Atelectasis: Secondary | ICD-10-CM | POA: Diagnosis present

## 2018-07-24 DIAGNOSIS — I420 Dilated cardiomyopathy: Secondary | ICD-10-CM | POA: Diagnosis present

## 2018-07-24 DIAGNOSIS — E860 Dehydration: Secondary | ICD-10-CM | POA: Diagnosis present

## 2018-07-24 DIAGNOSIS — R55 Syncope and collapse: Secondary | ICD-10-CM | POA: Diagnosis present

## 2018-07-24 DIAGNOSIS — N184 Chronic kidney disease, stage 4 (severe): Secondary | ICD-10-CM | POA: Diagnosis present

## 2018-07-24 DIAGNOSIS — I5082 Biventricular heart failure: Secondary | ICD-10-CM | POA: Diagnosis present

## 2018-07-24 DIAGNOSIS — R601 Generalized edema: Secondary | ICD-10-CM

## 2018-07-24 DIAGNOSIS — R000 Tachycardia, unspecified: Secondary | ICD-10-CM | POA: Diagnosis not present

## 2018-07-24 DIAGNOSIS — R52 Pain, unspecified: Secondary | ICD-10-CM | POA: Diagnosis not present

## 2018-07-24 DIAGNOSIS — R824 Acetonuria: Secondary | ICD-10-CM | POA: Diagnosis present

## 2018-07-24 DIAGNOSIS — I340 Nonrheumatic mitral (valve) insufficiency: Secondary | ICD-10-CM | POA: Diagnosis not present

## 2018-07-24 DIAGNOSIS — R7989 Other specified abnormal findings of blood chemistry: Secondary | ICD-10-CM | POA: Diagnosis not present

## 2018-07-24 DIAGNOSIS — I447 Left bundle-branch block, unspecified: Secondary | ICD-10-CM | POA: Diagnosis present

## 2018-07-24 DIAGNOSIS — Z87891 Personal history of nicotine dependence: Secondary | ICD-10-CM

## 2018-07-24 DIAGNOSIS — R0902 Hypoxemia: Secondary | ICD-10-CM | POA: Diagnosis not present

## 2018-07-24 DIAGNOSIS — M25512 Pain in left shoulder: Secondary | ICD-10-CM | POA: Diagnosis not present

## 2018-07-24 DIAGNOSIS — J90 Pleural effusion, not elsewhere classified: Secondary | ICD-10-CM | POA: Diagnosis not present

## 2018-07-24 DIAGNOSIS — N170 Acute kidney failure with tubular necrosis: Secondary | ICD-10-CM | POA: Diagnosis present

## 2018-07-24 DIAGNOSIS — Z6823 Body mass index (BMI) 23.0-23.9, adult: Secondary | ICD-10-CM

## 2018-07-24 DIAGNOSIS — I959 Hypotension, unspecified: Secondary | ICD-10-CM | POA: Diagnosis not present

## 2018-07-24 DIAGNOSIS — S299XXA Unspecified injury of thorax, initial encounter: Secondary | ICD-10-CM | POA: Diagnosis not present

## 2018-07-24 DIAGNOSIS — R188 Other ascites: Secondary | ICD-10-CM | POA: Diagnosis present

## 2018-07-24 DIAGNOSIS — R740 Nonspecific elevation of levels of transaminase and lactic acid dehydrogenase [LDH]: Secondary | ICD-10-CM | POA: Diagnosis present

## 2018-07-24 DIAGNOSIS — E872 Acidosis: Secondary | ICD-10-CM | POA: Diagnosis present

## 2018-07-24 DIAGNOSIS — Z7901 Long term (current) use of anticoagulants: Secondary | ICD-10-CM

## 2018-07-24 DIAGNOSIS — I129 Hypertensive chronic kidney disease with stage 1 through stage 4 chronic kidney disease, or unspecified chronic kidney disease: Secondary | ICD-10-CM | POA: Diagnosis not present

## 2018-07-24 DIAGNOSIS — R627 Adult failure to thrive: Secondary | ICD-10-CM | POA: Diagnosis present

## 2018-07-24 DIAGNOSIS — T464X5A Adverse effect of angiotensin-converting-enzyme inhibitors, initial encounter: Secondary | ICD-10-CM | POA: Diagnosis present

## 2018-07-24 DIAGNOSIS — T82538A Leakage of other cardiac and vascular devices and implants, initial encounter: Secondary | ICD-10-CM

## 2018-07-24 DIAGNOSIS — E871 Hypo-osmolality and hyponatremia: Secondary | ICD-10-CM | POA: Diagnosis present

## 2018-07-24 DIAGNOSIS — K7290 Hepatic failure, unspecified without coma: Secondary | ICD-10-CM | POA: Diagnosis present

## 2018-07-24 DIAGNOSIS — I480 Paroxysmal atrial fibrillation: Secondary | ICD-10-CM | POA: Diagnosis present

## 2018-07-24 DIAGNOSIS — K5730 Diverticulosis of large intestine without perforation or abscess without bleeding: Secondary | ICD-10-CM | POA: Diagnosis present

## 2018-07-24 DIAGNOSIS — Z9114 Patient's other noncompliance with medication regimen: Secondary | ICD-10-CM

## 2018-07-24 DIAGNOSIS — Z880 Allergy status to penicillin: Secondary | ICD-10-CM

## 2018-07-24 DIAGNOSIS — I2729 Other secondary pulmonary hypertension: Secondary | ICD-10-CM | POA: Diagnosis present

## 2018-07-24 DIAGNOSIS — R579 Shock, unspecified: Secondary | ICD-10-CM | POA: Diagnosis not present

## 2018-07-24 DIAGNOSIS — M545 Low back pain: Secondary | ICD-10-CM | POA: Diagnosis present

## 2018-07-24 DIAGNOSIS — Z95810 Presence of automatic (implantable) cardiac defibrillator: Secondary | ICD-10-CM

## 2018-07-24 DIAGNOSIS — I361 Nonrheumatic tricuspid (valve) insufficiency: Secondary | ICD-10-CM | POA: Diagnosis not present

## 2018-07-24 DIAGNOSIS — Z9049 Acquired absence of other specified parts of digestive tract: Secondary | ICD-10-CM

## 2018-07-24 DIAGNOSIS — I4892 Unspecified atrial flutter: Secondary | ICD-10-CM | POA: Diagnosis present

## 2018-07-24 DIAGNOSIS — I9589 Other hypotension: Secondary | ICD-10-CM | POA: Diagnosis not present

## 2018-07-24 DIAGNOSIS — D696 Thrombocytopenia, unspecified: Secondary | ICD-10-CM | POA: Diagnosis present

## 2018-07-24 DIAGNOSIS — G4733 Obstructive sleep apnea (adult) (pediatric): Secondary | ICD-10-CM | POA: Diagnosis present

## 2018-07-24 DIAGNOSIS — R0689 Other abnormalities of breathing: Secondary | ICD-10-CM | POA: Diagnosis not present

## 2018-07-24 DIAGNOSIS — R748 Abnormal levels of other serum enzymes: Secondary | ICD-10-CM

## 2018-07-24 DIAGNOSIS — I499 Cardiac arrhythmia, unspecified: Secondary | ICD-10-CM | POA: Diagnosis not present

## 2018-07-24 DIAGNOSIS — R0602 Shortness of breath: Secondary | ICD-10-CM

## 2018-07-24 DIAGNOSIS — S3992XA Unspecified injury of lower back, initial encounter: Secondary | ICD-10-CM | POA: Diagnosis not present

## 2018-07-24 DIAGNOSIS — Z833 Family history of diabetes mellitus: Secondary | ICD-10-CM

## 2018-07-24 DIAGNOSIS — N400 Enlarged prostate without lower urinary tract symptoms: Secondary | ICD-10-CM | POA: Diagnosis present

## 2018-07-24 DIAGNOSIS — Z9119 Patient's noncompliance with other medical treatment and regimen: Secondary | ICD-10-CM

## 2018-07-24 DIAGNOSIS — Z66 Do not resuscitate: Secondary | ICD-10-CM | POA: Diagnosis not present

## 2018-07-24 DIAGNOSIS — R069 Unspecified abnormalities of breathing: Secondary | ICD-10-CM

## 2018-07-24 LAB — CBC WITH DIFFERENTIAL/PLATELET
Abs Immature Granulocytes: 0.03 10*3/uL (ref 0.00–0.07)
Basophils Absolute: 0 10*3/uL (ref 0.0–0.1)
Basophils Relative: 0 %
Eosinophils Absolute: 0 10*3/uL (ref 0.0–0.5)
Eosinophils Relative: 0 %
HCT: 48 % (ref 39.0–52.0)
Hemoglobin: 15.5 g/dL (ref 13.0–17.0)
Immature Granulocytes: 0 %
Lymphocytes Relative: 9 %
Lymphs Abs: 0.9 10*3/uL (ref 0.7–4.0)
MCH: 31.1 pg (ref 26.0–34.0)
MCHC: 32.3 g/dL (ref 30.0–36.0)
MCV: 96.2 fL (ref 80.0–100.0)
Monocytes Absolute: 0.8 10*3/uL (ref 0.1–1.0)
Monocytes Relative: 8 %
Neutro Abs: 8 10*3/uL — ABNORMAL HIGH (ref 1.7–7.7)
Neutrophils Relative %: 83 %
Platelets: 141 10*3/uL — ABNORMAL LOW (ref 150–400)
RBC: 4.99 MIL/uL (ref 4.22–5.81)
RDW: 15.2 % (ref 11.5–15.5)
WBC: 9.7 10*3/uL (ref 4.0–10.5)
nRBC: 0 % (ref 0.0–0.2)

## 2018-07-24 LAB — COMPREHENSIVE METABOLIC PANEL
ALT: UNDETERMINED U/L (ref 0–44)
AST: 44 U/L — ABNORMAL HIGH (ref 15–41)
Albumin: 3.4 g/dL — ABNORMAL LOW (ref 3.5–5.0)
Alkaline Phosphatase: 271 U/L — ABNORMAL HIGH (ref 38–126)
Anion gap: 20 — ABNORMAL HIGH (ref 5–15)
BUN: 64 mg/dL — AB (ref 8–23)
CO2: 13 mmol/L — ABNORMAL LOW (ref 22–32)
Calcium: 8.7 mg/dL — ABNORMAL LOW (ref 8.9–10.3)
Chloride: 101 mmol/L (ref 98–111)
Creatinine, Ser: 2.29 mg/dL — ABNORMAL HIGH (ref 0.61–1.24)
GFR calc Af Amer: 31 mL/min — ABNORMAL LOW (ref >60)
GFR calc non Af Amer: 27 mL/min — ABNORMAL LOW (ref >60)
Glucose, Bld: 160 mg/dL — ABNORMAL HIGH (ref 70–99)
Potassium: 5 mmol/L (ref 3.5–5.1)
SODIUM: 134 mmol/L — AB (ref 135–145)
Total Bilirubin: 2.9 mg/dL — ABNORMAL HIGH (ref 0.3–1.2)
Total Protein: 5.7 g/dL — ABNORMAL LOW (ref 6.5–8.1)

## 2018-07-24 LAB — PROTIME-INR
INR: 1.6
Prothrombin Time: 18.8 seconds — ABNORMAL HIGH (ref 11.4–15.2)

## 2018-07-24 LAB — CBG MONITORING, ED: Glucose-Capillary: 117 mg/dL — ABNORMAL HIGH (ref 70–99)

## 2018-07-24 LAB — TROPONIN I: Troponin I: 0.06 ng/mL (ref <0.03)

## 2018-07-24 MED ORDER — SODIUM CHLORIDE 0.9 % IV BOLUS
1000.0000 mL | Freq: Once | INTRAVENOUS | Status: AC
  Administered 2018-07-25: 1000 mL via INTRAVENOUS

## 2018-07-24 MED ORDER — SODIUM CHLORIDE 0.9 % IV BOLUS
1000.0000 mL | Freq: Once | INTRAVENOUS | Status: AC
  Administered 2018-07-24: 1000 mL via INTRAVENOUS

## 2018-07-24 MED ORDER — SODIUM CHLORIDE 0.9 % IV SOLN: INTRAVENOUS | Status: DC

## 2018-07-24 NOTE — ED Triage Notes (Signed)
Pt to ED via [facility] after reported having a ? Syncopal episode.  Pt c/o pain in his left shoulder and lower back

## 2018-07-24 NOTE — ED Provider Notes (Signed)
[facility] EMERGENCY DEPARTMENT Provider Note   CSN: [id number] Arrival date & time: 2018-08-20  2028     History   Chief Complaint Chief Complaint  Patient presents with  . Fall  . Loss of Consciousness    HPI [name] is a 72 y.o. male.  Pt presents to the ED today with a syncopal episode.  The pt has a hx of multiple medical problems who has not taken any meds in almost a year.  The pt's family said he has not been eating or drinking much.  The pt's wife has tried to get him into seeing his doctor, but pt keeps cancelling his appointments.  Pt has fallen several times recently.  The pt was brushing his teeth tonight when he passed out.  The pt c/o LBP and left shoulder pain.       Past Medical History:  Diagnosis Date  . Atrial fibrillation (HCC)    Declines anticoagulation  . CAD (coronary artery disease)    Minimal nonobstructive CAD at catheterization 2009  . Chronic systolic heart failure (HCC)   . Deafness in right ear   . Essential hypertension   . Hyperlipidemia   . Infection involving implantable cardioverter-defibrillator (ICD) [facility])    St. Jude biventricular ICD, device extraction November 2017 - Dr. Ladona Ridgel  . LBBB (left bundle branch block)   . Nonischemic cardiomyopathy (HCC)   . Pneumonia   . Type 2 diabetes mellitus [facility])     Patient Active Problem List   Diagnosis Date Noted  . ICD (implantable cardioverter-defibrillator) infection, sequela 06/04/2016  . ICD (implantable cardioverter-defibrillator) infection (HCC) 04/22/2016  . Noncompliance 06/09/2012  . Biventricular implantable cardioverter-defibrillator in situ 06/09/2011  . DM 03/31/2009  . ATRIAL FIBRILLATION, PAROXYSMAL 03/31/2009  . CAD, NATIVE VESSEL 03/28/2009  . Nonischemic dilated cardiomyopathy (HCC) 03/28/2009  . SYSTOLIC HEART FAILURE, CHRONIC 03/28/2009    Past Surgical History:  Procedure Laterality Date  . APPENDECTOMY    . CARDIAC DEFIBRILLATOR  PLACEMENT  12/14/2007; 11/23/13   St. Jude Promote RF 3207 ICD placed by Dr. Graciela Husbands for CHF, nonischemic cardiomyopathy; gen change 11/2013 by Dr Johney Frame STJ  . CARDIAC DEFIBRILLATOR REMOVAL  06/04/2016  . CATARACT EXTRACTION W/PHACO Right 06/14/2013   Procedure: CATARACT EXTRACTION PHACO AND INTRAOCULAR LENS PLACEMENT (IOC);  Surgeon: Gemma Payor, MD;  Location: [facility];  Service: Ophthalmology;  Laterality: Right;  CDE:  21.74  . CATARACT EXTRACTION W/PHACO Left 07/08/2013   Procedure: CATARACT EXTRACTION PHACO AND INTRAOCULAR LENS PLACEMENT (IOC);  Surgeon: Gemma Payor, MD;  Location: [facility];  Service: Ophthalmology;  Laterality: Left;  CDE:24.66  . CHOLECYSTECTOMY    . Fiscula Repair    . Hearing Surgery    . HERNIA REPAIR    . ICD LEAD REMOVAL N/A 06/04/2016   Procedure: ICD LEAD REMOVAL;  Surgeon: Marinus Maw, MD;  Location: [facility] OR;  Service: Cardiovascular;  Laterality: N/A;  Owen to back up  . IMPLANTABLE CARDIOVERTER DEFIBRILLATOR GENERATOR CHANGE N/A 11/23/2013   Procedure: IMPLANTABLE CARDIOVERTER DEFIBRILLATOR GENERATOR CHANGE;  Surgeon: Gardiner Rhyme, MD;  Location: [facility] CATH LAB;  Service: Cardiovascular;  Laterality: N/A;  . TEE WITHOUT CARDIOVERSION N/A 06/04/2016   Procedure: TRANSESOPHAGEAL ECHOCARDIOGRAM (TEE);  Surgeon: Marinus Maw, MD;  Location: [facility] OR;  Service: Cardiovascular;  Laterality: N/A;        Home Medications    Prior to Admission medications   Medication Sig Start Date End Date Taking? Authorizing Provider  carvedilol (COREG)  25 MG tablet TAKE 1 TABLET TWICE DAILY  WITH  MEALS 07/24/17   Jonelle Sidle, MD  cholecalciferol (VITAMIN D) 1000 UNITS tablet Take 1,000 Units by mouth daily.    [provider]  Cinnamon 500 MG TABS Take 500 mg by mouth daily.     [provider]  digoxin (LANOXIN) 0.125 MG tablet TAKE 1 TABLET EVERY DAY 07/24/17   Jonelle Sidle, MD  furosemide (LASIX) 40 MG tablet TAKE 1/2 TABLET TWICE DAILY 07/24/17    Jonelle Sidle, MD  glipiZIDE (GLUCOTROL XL) 10 MG 24 hr tablet Take 10 mg by mouth 2 (two) times daily.    [provider]  lisinopril (PRINIVIL,ZESTRIL) 10 MG tablet TAKE 1 TABLET EVERY DAY 07/24/17   Jonelle Sidle, MD  metFORMIN (GLUCOPHAGE) 500 MG tablet Take 500 mg by mouth every evening.    [provider]  simvastatin (ZOCOR) 40 MG tablet TAKE 1 TABLET AT BEDTIME 07/24/17   Jonelle Sidle, MD    Family History Family History  Problem Relation Age of Onset  . Cancer Other   . Coronary artery disease Other   . Diabetes Other     Social History Social History   Tobacco Use  . Smoking status: Former Smoker    Packs/day: 0.30    Years: 5.00    Pack years: 1.50    Types: Cigarettes, Cigars    Start date: 02/15/1959    Last attempt to quit: 08/05/1965    Years since quitting: 53.0  . Smokeless tobacco: Never Used  Substance Use Topics  . Alcohol use: No    Alcohol/week: 0.0 standard drinks  . Drug use: No     Allergies   Penicillins   Review of Systems Review of Systems  Musculoskeletal: Positive for back pain.       Left shoulder pain  Neurological: Positive for syncope.  All other systems reviewed and are negative.    Physical Exam Updated Vital Signs BP 99/64   Pulse 74   Temp (!) 97.4 F (36.3 C) (Oral)   Resp (!) 27   Ht 6' (1.829 m)   Wt 79.4 kg   SpO2 100%   BMI 23.73 kg/m   Physical Exam Vitals signs and nursing note reviewed.  Constitutional:      Appearance: Normal appearance. He is obese.  HENT:     Head: Normocephalic and atraumatic.     Right Ear: External ear normal.     Left Ear: External ear normal.     Nose: Nose normal.     Mouth/Throat:     Pharynx: Oropharynx is clear.  Eyes:     Extraocular Movements: Extraocular movements intact.     Conjunctiva/sclera: Conjunctivae normal.     Pupils: Pupils are equal, round, and reactive to light.  Neck:     Musculoskeletal: Normal range of motion.    Cardiovascular:     Rate and Rhythm: Regular rhythm. Tachycardia present.     Pulses: Normal pulses.     Heart sounds: Normal heart sounds.  Pulmonary:     Effort: Pulmonary effort is normal.     Breath sounds: Normal breath sounds.  Abdominal:     General: Abdomen is flat. Bowel sounds are normal.     Palpations: Abdomen is soft.  Musculoskeletal:       Arms:     Comments: Generalized anasarca  Skin:    General: Skin is warm.     Capillary Refill: Capillary refill  takes less than 2 seconds.  Neurological:     General: No focal deficit present.     Mental Status: He is alert and oriented to person, place, and time.  Psychiatric:        Mood and Affect: Mood normal.        Behavior: Behavior normal.      ED Treatments / Results  Labs (all labs ordered are listed, but only abnormal results are displayed) Labs Reviewed  CBC WITH DIFFERENTIAL/PLATELET - Abnormal; Notable for the following components:      Result Value   Platelets 141 (*)    Neutro Abs 8.0 (*)    All other components within normal limits  COMPREHENSIVE METABOLIC PANEL - Abnormal; Notable for the following components:   Sodium 134 (*)    CO2 13 (*)    Glucose, Bld 160 (*)    BUN 64 (*)    Creatinine, Ser 2.29 (*)    Calcium 8.7 (*)    Total Protein 5.7 (*)    Albumin 3.4 (*)    AST 44 (*)    Alkaline Phosphatase 271 (*)    Total Bilirubin 2.9 (*)    GFR calc non Af Amer 27 (*)    GFR calc Af Amer 31 (*)    Anion gap 20 (*)    All other components within normal limits  TROPONIN I - Abnormal; Notable for the following components:   Troponin I 0.06 (*)    All other components within normal limits  PROTIME-INR - Abnormal; Notable for the following components:   Prothrombin Time 18.8 (*)    All other components within normal limits  CBG MONITORING, ED - Abnormal; Notable for the following components:   Glucose-Capillary 117 (*)    All other components within normal limits  URINALYSIS, ROUTINE W  REFLEX MICROSCOPIC  I-STAT CG4 LACTIC ACID, ED    EKG EKG Interpretation  Date/Time:  Friday 27-Jul-2018 20:43:14 EST Ventricular Rate:  119 PR Interval:    QRS Duration: 163 QT Interval:  382 QTC Calculation: 538 R Axis:   159 Text Interpretation:  Sinus or ectopic atrial tachycardia Right bundle branch block Anterolateral infarct, age indeterminate No significant change since last tracing Confirmed by Jacalyn Lefevre [phone]) on 07-27-18 8:57:47 PM   Radiology Dg Chest 2 View  Result Date: 2018/07/27 CLINICAL DATA:  Fall EXAM: CHEST - 2 VIEW COMPARISON:  06/05/2016 FINDINGS: Small right-sided pleural effusion. Hazy atelectasis or infiltrate at the right base. Cardiomegaly with vascular congestion. Aortic atherosclerosis. No pneumothorax. IMPRESSION: 1. Small right-sided pleural effusion with atelectasis or infiltrate at the right base 2. Cardiomegaly with mild central vascular congestion Electronically Signed   By: Jasmine Pang M.D.   On: 07-27-2018 22:21   Dg Lumbar Spine Complete  Result Date: 07/27/18 CLINICAL DATA:  Fall with low back pain EXAM: LUMBAR SPINE - COMPLETE 4+ VIEW COMPARISON:  None. FINDINGS: Lumbar alignment is within normal limits. Vertebral body heights are maintained. Aortic atherosclerosis. Mild degenerative change L1-L2, L2-L3. Mild degenerative change L5-S1. Posterior facet degenerative change L4 through S1. IMPRESSION: 1. No acute osseous abnormality. 2. Multiple level degenerative changes Electronically Signed   By: Jasmine Pang M.D.   On: 07/27/18 22:23   Dg Shoulder Left  Result Date: 07-27-18 CLINICAL DATA:  Fall with shoulder pain EXAM: LEFT SHOULDER - 2+ VIEW COMPARISON:  None. FINDINGS: There is no evidence of fracture or dislocation. There is no evidence of arthropathy or other focal bone abnormality. Soft  tissues are unremarkable. IMPRESSION: Negative. Electronically Signed   By: Jasmine PangKim  Fujinaga M.D.   On: 07/30/2018 22:23     Procedures Procedures (including critical care time)  Medications Ordered in ED Medications  sodium chloride 0.9 % bolus 1,000 mL (0 mLs Intravenous Stopped 07/06/2018 2202)    And  0.9 %  sodium chloride infusion (has no administration in time range)  sodium chloride 0.9 % bolus 1,000 mL (has no administration in time range)     Initial Impression / Assessment and Plan / ED Course  I have reviewed the triage vital signs and the nursing notes.  Pertinent labs & imaging results that were available during my care of the patient were reviewed by me and considered in my medical decision making (see chart for details).    Pt has AKI.  He will be given IVFs for that.  He is also acidotic.  Possibly from AKI.  No fever.  I added on a lactic acid.  Pt has anasarca, possibly from malnutrition.  BS is slightly elevated, but only 160.  So, I don't think he has DKA.  BP is low, so I will order another L of fluid.  Pt d/w Dr. Toniann FailKakrakandy (triad) for admission.  Final Clinical Impressions(s) / ED Diagnoses   Final diagnoses:  AKI (acute kidney injury) (HCC)  Anasarca  Syncope, unspecified syncope type  Noncompliance with medications    ED Discharge Orders    None       Jacalyn LefevreHaviland, Ruthellen Tippy, MD 07/27/2018 2356

## 2018-07-25 ENCOUNTER — Inpatient Hospital Stay (HOSPITAL_COMMUNITY): Payer: Medicare HMO

## 2018-07-25 ENCOUNTER — Encounter (HOSPITAL_COMMUNITY): Payer: Self-pay | Admitting: Internal Medicine

## 2018-07-25 DIAGNOSIS — N184 Chronic kidney disease, stage 4 (severe): Secondary | ICD-10-CM | POA: Diagnosis present

## 2018-07-25 DIAGNOSIS — R7989 Other specified abnormal findings of blood chemistry: Secondary | ICD-10-CM | POA: Diagnosis present

## 2018-07-25 DIAGNOSIS — R601 Generalized edema: Secondary | ICD-10-CM | POA: Diagnosis not present

## 2018-07-25 DIAGNOSIS — I428 Other cardiomyopathies: Secondary | ICD-10-CM | POA: Diagnosis not present

## 2018-07-25 DIAGNOSIS — N179 Acute kidney failure, unspecified: Secondary | ICD-10-CM | POA: Diagnosis not present

## 2018-07-25 DIAGNOSIS — R55 Syncope and collapse: Secondary | ICD-10-CM | POA: Diagnosis present

## 2018-07-25 DIAGNOSIS — M545 Low back pain: Secondary | ICD-10-CM | POA: Diagnosis present

## 2018-07-25 DIAGNOSIS — I9589 Other hypotension: Secondary | ICD-10-CM | POA: Diagnosis not present

## 2018-07-25 DIAGNOSIS — I130 Hypertensive heart and chronic kidney disease with heart failure and stage 1 through stage 4 chronic kidney disease, or unspecified chronic kidney disease: Secondary | ICD-10-CM | POA: Diagnosis present

## 2018-07-25 DIAGNOSIS — N170 Acute kidney failure with tubular necrosis: Secondary | ICD-10-CM | POA: Diagnosis present

## 2018-07-25 DIAGNOSIS — E1122 Type 2 diabetes mellitus with diabetic chronic kidney disease: Secondary | ICD-10-CM | POA: Diagnosis present

## 2018-07-25 DIAGNOSIS — D631 Anemia in chronic kidney disease: Secondary | ICD-10-CM | POA: Diagnosis present

## 2018-07-25 DIAGNOSIS — I480 Paroxysmal atrial fibrillation: Secondary | ICD-10-CM | POA: Diagnosis present

## 2018-07-25 DIAGNOSIS — I959 Hypotension, unspecified: Secondary | ICD-10-CM | POA: Diagnosis not present

## 2018-07-25 DIAGNOSIS — L8990 Pressure ulcer of unspecified site, unspecified stage: Secondary | ICD-10-CM

## 2018-07-25 DIAGNOSIS — E871 Hypo-osmolality and hyponatremia: Secondary | ICD-10-CM | POA: Diagnosis present

## 2018-07-25 DIAGNOSIS — R570 Cardiogenic shock: Secondary | ICD-10-CM | POA: Diagnosis present

## 2018-07-25 DIAGNOSIS — R112 Nausea with vomiting, unspecified: Secondary | ICD-10-CM | POA: Diagnosis present

## 2018-07-25 DIAGNOSIS — R579 Shock, unspecified: Secondary | ICD-10-CM | POA: Diagnosis not present

## 2018-07-25 DIAGNOSIS — H9191 Unspecified hearing loss, right ear: Secondary | ICD-10-CM | POA: Diagnosis present

## 2018-07-25 DIAGNOSIS — R945 Abnormal results of liver function studies: Secondary | ICD-10-CM

## 2018-07-25 DIAGNOSIS — E860 Dehydration: Secondary | ICD-10-CM | POA: Diagnosis present

## 2018-07-25 DIAGNOSIS — Z515 Encounter for palliative care: Secondary | ICD-10-CM | POA: Diagnosis not present

## 2018-07-25 DIAGNOSIS — I5022 Chronic systolic (congestive) heart failure: Secondary | ICD-10-CM | POA: Diagnosis not present

## 2018-07-25 DIAGNOSIS — J9811 Atelectasis: Secondary | ICD-10-CM | POA: Diagnosis present

## 2018-07-25 DIAGNOSIS — E872 Acidosis: Secondary | ICD-10-CM | POA: Diagnosis present

## 2018-07-25 DIAGNOSIS — I420 Dilated cardiomyopathy: Secondary | ICD-10-CM | POA: Diagnosis present

## 2018-07-25 DIAGNOSIS — Z9114 Patient's other noncompliance with medication regimen: Secondary | ICD-10-CM

## 2018-07-25 DIAGNOSIS — Z6823 Body mass index (BMI) 23.0-23.9, adult: Secondary | ICD-10-CM | POA: Diagnosis not present

## 2018-07-25 DIAGNOSIS — I5043 Acute on chronic combined systolic (congestive) and diastolic (congestive) heart failure: Secondary | ICD-10-CM | POA: Diagnosis present

## 2018-07-25 DIAGNOSIS — Z7189 Other specified counseling: Secondary | ICD-10-CM | POA: Diagnosis not present

## 2018-07-25 DIAGNOSIS — D696 Thrombocytopenia, unspecified: Secondary | ICD-10-CM | POA: Diagnosis present

## 2018-07-25 DIAGNOSIS — R188 Other ascites: Secondary | ICD-10-CM | POA: Diagnosis present

## 2018-07-25 DIAGNOSIS — R627 Adult failure to thrive: Secondary | ICD-10-CM | POA: Diagnosis present

## 2018-07-25 DIAGNOSIS — I340 Nonrheumatic mitral (valve) insufficiency: Secondary | ICD-10-CM | POA: Diagnosis not present

## 2018-07-25 DIAGNOSIS — I2510 Atherosclerotic heart disease of native coronary artery without angina pectoris: Secondary | ICD-10-CM | POA: Diagnosis present

## 2018-07-25 DIAGNOSIS — I361 Nonrheumatic tricuspid (valve) insufficiency: Secondary | ICD-10-CM | POA: Diagnosis not present

## 2018-07-25 DIAGNOSIS — K4020 Bilateral inguinal hernia, without obstruction or gangrene, not specified as recurrent: Secondary | ICD-10-CM | POA: Diagnosis present

## 2018-07-25 DIAGNOSIS — E785 Hyperlipidemia, unspecified: Secondary | ICD-10-CM | POA: Diagnosis present

## 2018-07-25 DIAGNOSIS — I4892 Unspecified atrial flutter: Secondary | ICD-10-CM | POA: Diagnosis present

## 2018-07-25 DIAGNOSIS — I447 Left bundle-branch block, unspecified: Secondary | ICD-10-CM | POA: Diagnosis present

## 2018-07-25 LAB — TYPE AND SCREEN
ABO/RH(D): A POS
ANTIBODY SCREEN: NEGATIVE

## 2018-07-25 LAB — LACTIC ACID, PLASMA
Lactic Acid, Venous: 2.8 mmol/L (ref 0.5–1.9)
Lactic Acid, Venous: 2.6 mmol/L (ref 0.5–1.9)

## 2018-07-25 LAB — URINALYSIS, ROUTINE W REFLEX MICROSCOPIC
Glucose, UA: NEGATIVE mg/dL
Hgb urine dipstick: NEGATIVE
Ketones, ur: 15 mg/dL — AB
Leukocytes, UA: NEGATIVE
NITRITE: NEGATIVE
Protein, ur: NEGATIVE mg/dL
Specific Gravity, Urine: 1.025 (ref 1.005–1.030)
pH: 5 (ref 5.0–8.0)

## 2018-07-25 LAB — TROPONIN I
Troponin I: 0.08 ng/mL (ref <0.03)
Troponin I: 0.07 ng/mL (ref <0.03)
Troponin I: 0.08 ng/mL (ref <0.03)

## 2018-07-25 LAB — CBC WITH DIFFERENTIAL/PLATELET
Abs Immature Granulocytes: 0.03 10*3/uL (ref 0.00–0.07)
BASOS PCT: 0 %
Basophils Absolute: 0 10*3/uL (ref 0.0–0.1)
Eosinophils Absolute: 0.1 10*3/uL (ref 0.0–0.5)
Eosinophils Relative: 1 %
HCT: 43.8 % (ref 39.0–52.0)
Hemoglobin: 14.7 g/dL (ref 13.0–17.0)
Immature Granulocytes: 0 %
Lymphocytes Relative: 11 %
Lymphs Abs: 0.9 10*3/uL (ref 0.7–4.0)
MCH: 31.7 pg (ref 26.0–34.0)
MCHC: 33.6 g/dL (ref 30.0–36.0)
MCV: 94.4 fL (ref 80.0–100.0)
Monocytes Absolute: 0.7 10*3/uL (ref 0.1–1.0)
Monocytes Relative: 8 %
Neutro Abs: 6.7 10*3/uL (ref 1.7–7.7)
Neutrophils Relative %: 80 %
Platelets: 145 10*3/uL — ABNORMAL LOW (ref 150–400)
RBC: 4.64 MIL/uL (ref 4.22–5.81)
RDW: 15.2 % (ref 11.5–15.5)
WBC: 8.5 10*3/uL (ref 4.0–10.5)
nRBC: 0 % (ref 0.0–0.2)

## 2018-07-25 LAB — BASIC METABOLIC PANEL
Anion gap: 15 (ref 5–15)
BUN: 61 mg/dL — ABNORMAL HIGH (ref 8–23)
CO2: 19 mmol/L — ABNORMAL LOW (ref 22–32)
CREATININE: 2.28 mg/dL — AB (ref 0.61–1.24)
Calcium: 8.4 mg/dL — ABNORMAL LOW (ref 8.9–10.3)
Chloride: 101 mmol/L (ref 98–111)
GFR calc non Af Amer: 27 mL/min — ABNORMAL LOW (ref >60)
GFR, EST AFRICAN AMERICAN: 32 mL/min — AB (ref >60)
Glucose, Bld: 154 mg/dL — ABNORMAL HIGH (ref 70–99)
Potassium: 4.1 mmol/L (ref 3.5–5.1)
Sodium: 135 mmol/L (ref 135–145)

## 2018-07-25 LAB — CORTISOL: Cortisol, Plasma: 22.9 ug/dL

## 2018-07-25 LAB — TSH: TSH: 3.791 u[IU]/mL (ref 0.350–4.500)

## 2018-07-25 LAB — BRAIN NATRIURETIC PEPTIDE: B Natriuretic Peptide: 1351.7 pg/mL — ABNORMAL HIGH (ref 0.0–100.0)

## 2018-07-25 LAB — GLUCOSE, CAPILLARY
Glucose-Capillary: 143 mg/dL — ABNORMAL HIGH (ref 70–99)
Glucose-Capillary: 145 mg/dL — ABNORMAL HIGH (ref 70–99)
Glucose-Capillary: 103 mg/dL — ABNORMAL HIGH (ref 70–99)

## 2018-07-25 LAB — HEPATIC FUNCTION PANEL
ALT: 98 U/L — ABNORMAL HIGH (ref 0–44)
AST: 33 U/L (ref 15–41)
Albumin: 3 g/dL — ABNORMAL LOW (ref 3.5–5.0)
Alkaline Phosphatase: 263 U/L — ABNORMAL HIGH (ref 38–126)
BILIRUBIN DIRECT: 1.1 mg/dL — AB (ref 0.0–0.2)
Indirect Bilirubin: 1.4 mg/dL — ABNORMAL HIGH (ref 0.3–0.9)
Total Bilirubin: 2.5 mg/dL — ABNORMAL HIGH (ref 0.3–1.2)
Total Protein: 5.2 g/dL — ABNORMAL LOW (ref 6.5–8.1)

## 2018-07-25 LAB — HEMOGLOBIN A1C
HEMOGLOBIN A1C: 7.6 % — AB (ref 4.8–5.6)
Mean Plasma Glucose: 171.42 mg/dL

## 2018-07-25 LAB — PROCALCITONIN: Procalcitonin: 0.21 ng/mL

## 2018-07-25 LAB — LACTATE DEHYDROGENASE: LDH: 173 U/L (ref 98–192)

## 2018-07-25 MED ORDER — ASPIRIN 81 MG PO CHEW
81.0000 mg | CHEWABLE_TABLET | Freq: Every day | ORAL | Status: DC
  Administered 2018-07-25 – 2018-07-29 (×5): 81 mg via ORAL
  Filled 2018-07-25 (×6): qty 1

## 2018-07-25 MED ORDER — HEPARIN SODIUM (PORCINE) 5000 UNIT/ML IJ SOLN
5000.0000 [IU] | Freq: Three times a day (TID) | INTRAMUSCULAR | Status: DC
  Administered 2018-07-25 (×3): 5000 [IU] via SUBCUTANEOUS
  Filled 2018-07-25 (×3): qty 1

## 2018-07-25 MED ORDER — LACTATED RINGERS IV SOLN
INTRAVENOUS | Status: DC
  Administered 2018-07-25: 11:00:00 via INTRAVENOUS

## 2018-07-25 MED ORDER — SODIUM CHLORIDE 0.9 % IV BOLUS
500.0000 mL | Freq: Once | INTRAVENOUS | Status: AC
  Administered 2018-07-25: 500 mL via INTRAVENOUS

## 2018-07-25 MED ORDER — ATORVASTATIN CALCIUM 40 MG PO TABS
40.0000 mg | ORAL_TABLET | Freq: Every day | ORAL | Status: DC
  Administered 2018-07-25 – 2018-07-27 (×3): 40 mg via ORAL
  Filled 2018-07-25 (×3): qty 1

## 2018-07-25 MED ORDER — ONDANSETRON HCL 4 MG/2ML IJ SOLN: 4.0000 mg | Freq: Four times a day (QID) | INTRAMUSCULAR | Status: DC | PRN

## 2018-07-25 MED ORDER — SODIUM CHLORIDE 0.9 % IV SOLN
INTRAVENOUS | Status: DC
  Administered 2018-07-25: 06:00:00 via INTRAVENOUS

## 2018-07-25 MED ORDER — AMIODARONE HCL IN DEXTROSE 360-4.14 MG/200ML-% IV SOLN
60.0000 mg/h | INTRAVENOUS | Status: DC
  Administered 2018-07-25 (×2): 60 mg/h via INTRAVENOUS
  Filled 2018-07-25: qty 200

## 2018-07-25 MED ORDER — LACTATED RINGERS IV SOLN
INTRAVENOUS | Status: DC
  Administered 2018-07-25 (×3): via INTRAVENOUS

## 2018-07-25 MED ORDER — AMIODARONE HCL IN DEXTROSE 360-4.14 MG/200ML-% IV SOLN
30.0000 mg/h | INTRAVENOUS | Status: DC
  Administered 2018-07-26 – 2018-07-28 (×6): 30 mg/h via INTRAVENOUS
  Filled 2018-07-25 (×6): qty 200

## 2018-07-25 MED ORDER — MAGNESIUM SULFATE IN D5W 1-5 GM/100ML-% IV SOLN
1.0000 g | Freq: Once | INTRAVENOUS | Status: AC
  Administered 2018-07-25: 1 g via INTRAVENOUS
  Filled 2018-07-25: qty 100

## 2018-07-25 MED ORDER — INSULIN ASPART 100 UNIT/ML ~~LOC~~ SOLN
0.0000 [IU] | Freq: Three times a day (TID) | SUBCUTANEOUS | Status: DC
  Administered 2018-07-25 (×2): 1 [IU] via SUBCUTANEOUS

## 2018-07-25 MED ORDER — ONDANSETRON HCL 4 MG PO TABS: 4.0000 mg | ORAL_TABLET | Freq: Four times a day (QID) | ORAL | Status: DC | PRN

## 2018-07-25 MED ORDER — ACETAMINOPHEN 325 MG PO TABS
650.0000 mg | ORAL_TABLET | Freq: Four times a day (QID) | ORAL | Status: DC | PRN
  Administered 2018-07-26: 650 mg via ORAL
  Filled 2018-07-25 (×2): qty 2

## 2018-07-25 MED ORDER — DIGOXIN 0.25 MG/ML IJ SOLN
0.1250 mg | Freq: Four times a day (QID) | INTRAMUSCULAR | Status: AC
  Administered 2018-07-25 (×2): 0.125 mg via INTRAVENOUS
  Filled 2018-07-25 (×2): qty 2

## 2018-07-25 MED ORDER — LACTATED RINGERS IV SOLN
INTRAVENOUS | Status: DC
  Administered 2018-07-25: 09:00:00 via INTRAVENOUS

## 2018-07-25 MED ORDER — ACETAMINOPHEN 650 MG RE SUPP: 650.0000 mg | Freq: Four times a day (QID) | RECTAL | Status: DC | PRN

## 2018-07-25 MED ORDER — AMIODARONE LOAD VIA INFUSION
150.0000 mg | Freq: Once | INTRAVENOUS | Status: AC
  Administered 2018-07-25: 150 mg via INTRAVENOUS
  Filled 2018-07-25: qty 83.34

## 2018-07-25 NOTE — Progress Notes (Signed)
Received report from Brian,RN in the ED.

## 2018-07-25 NOTE — Progress Notes (Addendum)
PROGRESS NOTE                                                                                                                                                                                                             Patient Demographics:    [name], is a 72 y.o. male, DOB - 06-24-1946, UJW:[id number]  Admit date - 07/13/2018   Admitting Physician Eduard Clos, MD  Outpatient Primary MD for the patient is Sasser, Clarene Critchley, MD  LOS - 0  Chief Complaint  Patient presents with  . Fall  . Loss of Consciousness       Brief Narrative  [name] is a 72 y.o. male with history of nonischemic cardiomyopathy last EF measured was 40 to 45% in January 2018 has had previous ICD placement which was removed due to infection, paroxysmal atrial fibrillation has refused anticoagulation previously, diabetes mellitus type 2 who has not been taking his medications for last 1 year and has not followed up with his physicians was brought to the ER by patient's family as patient has been feeling weak and has had at least 2 falls over the last 2 days and patient states he also has been having nausea vomiting last 1 week. NO Chest-Abd pain, no SOB,.   In the ER he was hypotensive with labs showing acute renal failure with creatinine of 2.2 which is increased from 1.82 years ago LFTs were mildly elevated EKG was showing sinus tachycardia with LBBB chest x-ray was unremarkable UA also was not showing any signs of infection.  Patient was given fluid bolus for acute renal failure and hypotension following which blood pressure is improving but still in the 90s with tachycardia.  On exam patient is not in distress or hypoxic.  Still tachycardic.  Has bilateral lower extremity edema 3+ up to the thighs.  Patient also has a left inguinal hernia.   Subjective:    [name] today has, No headache, No chest pain, No abdominal pain - No Nausea, No new weakness tingling or numbness, No  Cough - SOB. Feels weak +++   Assessment  & Plan :     1.  Generalized weakness, falls and hypotension with hypotensive shock.  Likely all due to nausea vomiting induced dehydration along with evidence of right heart failure and third spacing.  He will be aggressively hydrated, nausea vomiting has resolved, no abdominal or chest pain.  No shortness of breath.  Does not look like he has left-sided  heart failure.  CT chest abdomen pelvis has been ordered upon admission which will be completed for diagnostic purposes.  Continue aggressive hydration, TSH is stable will check baseline cortisol. Chronic SBP in 90s, goal SBP 90.   2.  History of chronic systolic heart failure EF 40%.  Had AICD which was removed for infection.  Noncompliant with medications.  No follow-up with physicians or medications in the last 1 year.  Counseled on compliance.  Currently dehydrated.  Evidence of right-sided heart failure.  Repeat echo pending.  3.  ARF on CKD stage IV.  Baseline creatinine around 1.2.  ARF due to hypotension and dehydration.  Hydrate and monitor.  4.  DM type II.  Noncompliant.  Does not take medications.  Currently on sliding scale will monitor and adjust.  Counseled.  Lab Results  Component Value Date   HGBA1C 7.6 (H) 07/25/2018   CBG (last 3)  Recent Labs    07/21/2018 2250 07/25/18 0812  GLUCAP 117* 143*    5.  Elevated LFTs.  Likely due to shock liver.  Trend is stable, no abdominal pain, hydrate and trend.  6. Paroxysmal A.Fib - advised to score of at least 3.  Noncompliant with medications.  Currently between sinus and paroxysmal A. Fib, 2 doses of IV digoxin for rate control ( not longterm), hydrate and monitor, TTE pending Cards following.  7.  Dyslipidemia.  Resume home dose simvastatin.  8.  Borderline lactate metabolic acidosis.  Due to dehydration.  Hydrate and monitor.  Signs of infection or sepsis.  9.  Mild thrombocytopenia.  Likely due to hypotensive shock.  Trend is  stable.  Will monitor.  10.  Mild borderline elevated troponin and non-ACS pattern.  No chest pain, likely again due to hypotensive shock and right-sided heart failure, no acute issue.  Check echo.  Will place on 81 of aspirin along with statin for now.  11.  Fall, syncope, back pain.  All due to hypotension, hydrate, PT eval, lumbar x-ray stable will monitor.  12.  Anasarca.  Likely due to right-sided heart failure, UA does not show any protein, serum albumin levels were acceptable, check echo, TED stockings, for now needs to be hydrated.  Diurese once blood pressure is stable.     Family Communication  :  None  Code Status :  Full  Disposition Plan  :  TBD  Consults  :  Cards  Procedures  :    TTE  CT Chest-Abd Pelvis -    DVT Prophylaxis  : Heparin  Lab Results  Component Value Date   PLT 145 (L) 07/25/2018    Diet :  Diet Order            Diet clear liquid Room service appropriate? Yes; Fluid consistency: Thin  Diet effective now               Inpatient Medications Scheduled Meds: . digoxin  0.125 mg Intravenous Q6H  . heparin  5,000 Units Subcutaneous Q8H  . insulin aspart  0-9 Units Subcutaneous TID WC   Continuous Infusions: . lactated ringers    . magnesium sulfate 1 - 4 g bolus IVPB     PRN Meds:.acetaminophen **OR** acetaminophen, ondansetron **OR** ondansetron (ZOFRAN) IV  Antibiotics  :   Anti-infectives (From admission, onward)   None          Objective:   Vitals:   07/25/18 0615 07/25/18 0618 07/25/18 0619 07/25/18 0759  BP: (!) 77/63 93/64 106/71 106/78  Pulse:      Resp:      Temp:    (!) 97.3 F (36.3 C)  TempSrc:    Oral  SpO2:      Weight:      Height:        Wt Readings from Last 3 Encounters:  26-Jul-2018 79.4 kg  10/14/16 82.1 kg  09/06/16 83.5 kg     Intake/Output Summary (Last 24 hours) at 07/25/2018 1006 Last data filed at 07/25/2018 0759 Gross per 24 hour  Intake 1653.24 ml  Output 300 ml  Net 1353.24 ml       Physical Exam  Awake Alert, Oriented X 3, No new F.N deficits, Normal affect [place].AT,PERRAL Supple Neck,No JVD, No cervical lymphadenopathy appriciated.  Symmetrical Chest wall movement, Good air movement bilaterally, CTAB RRR,No Gallops,Rubs or new Murmurs, No Parasternal Heave +ve B.Sounds, Abd Soft, No tenderness, No organomegaly appriciated, No rebound - guarding or rigidity. No Cyanosis, Clubbing, 2+ leg edema, No new Rash or bruise      Data Review:    CBC Recent Labs  Lab 07/26/2018 2210 07/25/18 0426  WBC 9.7 8.5  HGB 15.5 14.7  HCT 48.0 43.8  PLT 141* 145*  MCV 96.2 94.4  MCH 31.1 31.7  MCHC 32.3 33.6  RDW 15.2 15.2  LYMPHSABS 0.9 0.9  MONOABS 0.8 0.7  EOSABS 0.0 0.1  BASOSABS 0.0 0.0    Chemistries  Recent Labs  Lab July 26, 2018 2210 07/25/18 0426  NA 134* 135  K 5.0 4.1  CL 101 101  CO2 13* 19*  GLUCOSE 160* 154*  BUN 64* 61*  CREATININE 2.29* 2.28*  CALCIUM 8.7* 8.4*  AST 44* 33  ALT QUANTITY NOT SUFFICIENT, UNABLE TO PERFORM TEST 98*  ALKPHOS 271* 263*  BILITOT 2.9* 2.5*   ------------------------------------------------------------------------------------------------------------------ No results for input(s): CHOL, HDL, LDLCALC, TRIG, CHOLHDL, LDLDIRECT in the last 72 hours.  Lab Results  Component Value Date   HGBA1C 7.6 (H) 07/25/2018   ------------------------------------------------------------------------------------------------------------------ Recent Labs    07/25/18 0426  TSH 3.791   ------------------------------------------------------------------------------------------------------------------ No results for input(s): VITAMINB12, FOLATE, FERRITIN, TIBC, IRON, RETICCTPCT in the last 72 hours.  Coagulation profile Recent Labs  Lab Jul 26, 2018 2210  INR 1.60    No results for input(s): DDIMER in the last 72 hours.  Cardiac Enzymes Recent Labs  Lab 2018/07/26 2210 07/25/18 0426  TROPONINI 0.06* 0.08*    ------------------------------------------------------------------------------------------------------------------    Component Value Date/Time   BNP 1,351.7 (H) 07/25/2018 0426   CBG (last 3)  Recent Labs    26-Jul-2018 2250 07/25/18 0812  GLUCAP 117* 143*   Micro Results No results found for this or any previous visit (from the past 240 hour(s)).  Radiology Reports  Dg Chest 2 View  Result Date: 26-Jul-2018 CLINICAL DATA:  Fall EXAM: CHEST - 2 VIEW COMPARISON:  06/05/2016 FINDINGS: Small right-sided pleural effusion. Hazy atelectasis or infiltrate at the right base. Cardiomegaly with vascular congestion. Aortic atherosclerosis. No pneumothorax. IMPRESSION: 1. Small right-sided pleural effusion with atelectasis or infiltrate at the right base 2. Cardiomegaly with mild central vascular congestion Electronically Signed   By: Jasmine Pang M.D.   On: Jul 26, 2018 22:21   Dg Lumbar Spine Complete  Result Date: 2018-07-26 CLINICAL DATA:  Fall with low back pain EXAM: LUMBAR SPINE - COMPLETE 4+ VIEW COMPARISON:  None. FINDINGS: Lumbar alignment is within normal limits. Vertebral body heights are maintained. Aortic atherosclerosis. Mild degenerative change L1-L2, L2-L3. Mild degenerative change L5-S1. Posterior facet degenerative change L4 through S1.  IMPRESSION: 1. No acute osseous abnormality. 2. Multiple level degenerative changes Electronically Signed   By: Jasmine Pang M.D.   On: August 05, 2018 22:23   Dg Shoulder Left  Result Date: 05-Aug-2018 CLINICAL DATA:  Fall with shoulder pain EXAM: LEFT SHOULDER - 2+ VIEW COMPARISON:  None. FINDINGS: There is no evidence of fracture or dislocation. There is no evidence of arthropathy or other focal bone abnormality. Soft tissues are unremarkable. IMPRESSION: Negative. Electronically Signed   By: Jasmine Pang M.D.   On: 08-05-18 22:23    Time Spent in minutes  30   Susa Raring M.D on 07/25/2018 at 10:06 AM  To page go to [email or web address] -  password [facility]

## 2018-07-25 NOTE — H&P (Addendum)
History and Physical    Alan BothWilliam A Wyer ION:[id number] DOB: [birth year] DOA: 07/17/2018  PCP: Estanislado PandySasser, Paul W, MD  Patient coming from: Home.  Chief Complaint: Weakness and falls.  HPI: [name] is a 72 y.o. male with history of nonischemic cardiomyopathy last EF measured was 40 to 45% in January 2018 has had previous ICD placement which was removed due to infection, paroxysmal atrial fibrillation has refused anticoagulation previously, diabetes mellitus type 2 who has not been taking his medications for last 1 year and has not followed up with his physicians was brought to the ER by patient's family as patient has been feeling weak and has had at least 2 falls over the last 2 days and patient states he also has been having nausea vomiting last 1 week.  Denies any shortness of breath or chest pain.  He did not lose consciousness with the fall.  But did hit his back and is hurting.  Finding it difficult to walk after the fall because of the back pain.  ED Course: In the ER patient was hypotensive with labs showing acute renal failure with creatinine of 2.2 which is increased from 1.82 years ago LFTs were mildly elevated EKG was showing sinus tachycardia with LBBB chest x-ray was unremarkable UA also was not showing any signs of infection.  Patient was given fluid bolus for acute renal failure and hypotension following which blood pressure is improving but still in the 90s with tachycardia.  On exam patient is not in distress or hypoxic.  Still tachycardic.  Has bilateral lower extremity edema 3+ up to the thighs.  Patient also has a left inguinal hernia.  Review of Systems: As per HPI, rest all negative.   Past Medical History:  Diagnosis Date  . Atrial fibrillation (HCC)    Declines anticoagulation  . CAD (coronary artery disease)    Minimal nonobstructive CAD at catheterization 2009  . Chronic systolic heart failure (HCC)   . Deafness in right ear   . Essential hypertension     . Hyperlipidemia   . Infection involving implantable cardioverter-defibrillator (ICD) [facility](HCC)    St. Jude biventricular ICD, device extraction November 2017 - Dr. Ladona Ridgelaylor  . LBBB (left bundle branch block)   . Nonischemic cardiomyopathy (HCC)   . Pneumonia   . Type 2 diabetes mellitus (HCC)     Past Surgical History:  Procedure Laterality Date  . APPENDECTOMY    . CARDIAC DEFIBRILLATOR PLACEMENT  12/14/2007; 11/23/13   St. Jude Promote RF 3207 ICD placed by Dr. Graciela HusbandsKlein for CHF, nonischemic cardiomyopathy; gen change 11/2013 by Dr Johney FrameAllred STJ  . CARDIAC DEFIBRILLATOR REMOVAL  06/04/2016  . CATARACT EXTRACTION W/PHACO Right 06/14/2013   Procedure: CATARACT EXTRACTION PHACO AND INTRAOCULAR LENS PLACEMENT (IOC);  Surgeon: Gemma PayorKerry Hunt, MD;  Location: [facility];  Service: Ophthalmology;  Laterality: Right;  CDE:  21.74  . CATARACT EXTRACTION W/PHACO Left 07/08/2013   Procedure: CATARACT EXTRACTION PHACO AND INTRAOCULAR LENS PLACEMENT (IOC);  Surgeon: Gemma PayorKerry Hunt, MD;  Location: [facility];  Service: Ophthalmology;  Laterality: Left;  CDE:24.66  . CHOLECYSTECTOMY    . Fiscula Repair    . Hearing Surgery    . HERNIA REPAIR    . ICD LEAD REMOVAL N/A 06/04/2016   Procedure: ICD LEAD REMOVAL;  Surgeon: Marinus MawGregg W Taylor, MD;  [facility];  Service: Cardiovascular;  Laterality: N/A;  [place] to back up  . IMPLANTABLE CARDIOVERTER DEFIBRILLATOR GENERATOR CHANGE N/A 11/23/2013   Procedure: IMPLANTABLE CARDIOVERTER DEFIBRILLATOR GENERATOR  CHANGE;  Surgeon: Gardiner Rhyme, MD;  Location: [facility] CATH LAB;  Service: Cardiovascular;  Laterality: N/A;  . TEE WITHOUT CARDIOVERSION N/A 06/04/2016   Procedure: TRANSESOPHAGEAL ECHOCARDIOGRAM (TEE);  Surgeon: Marinus Maw, MD;  Location: [facility] OR;  Service: Cardiovascular;  Laterality: N/A;     reports that he quit smoking about 53 years ago. His smoking use included cigarettes and cigars. He started smoking about 59 years ago. He has a 1.50 pack-year smoking history. He has never  used smokeless tobacco. He reports that he does not drink alcohol or use drugs.  Allergies  Allergen Reactions  . Penicillins Hives, Rash and Other (See Comments)    Has patient had a PCN reaction causing immediate rash, facial/tongue/throat swelling, SOB or lightheadedness with hypotension: Yes Has patient had a PCN reaction causing severe rash involving mucus membranes or skin necrosis: No Has patient had a PCN reaction that required hospitalization No Has patient had a PCN reaction occurring within the last 10 years: No If all of the above answers are "NO", then may proceed with Cephalosporin use.     Family History  Problem Relation Age of Onset  . Cancer Other   . Coronary artery disease Other   . Diabetes Other     Prior to Admission medications   Medication Sig Start Date End Date Taking? Authorizing Provider  carvedilol (COREG) 25 MG tablet TAKE 1 TABLET TWICE DAILY  WITH  MEALS Patient not taking: Reported on 08-15-18 07/24/17   Jonelle Sidle, MD  digoxin (LANOXIN) 0.125 MG tablet TAKE 1 TABLET EVERY DAY Patient not taking: Reported on 2018/08/15 07/24/17   Jonelle Sidle, MD  furosemide (LASIX) 40 MG tablet TAKE 1/2 TABLET TWICE DAILY Patient not taking: Reported on 08-15-2018 07/24/17   Jonelle Sidle, MD  lisinopril (PRINIVIL,ZESTRIL) 10 MG tablet TAKE 1 TABLET EVERY DAY Patient not taking: Reported on 08-15-18 07/24/17   Jonelle Sidle, MD  simvastatin (ZOCOR) 40 MG tablet TAKE 1 TABLET AT BEDTIME Patient not taking: Reported on August 15, 2018 07/24/17   Jonelle Sidle, MD    Physical Exam: Vitals:   07/25/18 0000 07/25/18 0030 07/25/18 0045 07/25/18 0208  BP: 93/68 98/71  94/76  Pulse:   (!) 218 (!) 119  Resp: (!) 23 (!) 21 (!) 32 18  Temp:    97.7 F (36.5 C)  TempSrc:      SpO2:   100% 99%  Weight:      Height:          Constitutional: Moderately built and nourished. Vitals:   07/25/18 0000 07/25/18 0030 07/25/18 0045 07/25/18  0208  BP: 93/68 98/71  94/76  Pulse:   (!) 218 (!) 119  Resp: (!) 23 (!) 21 (!) 32 18  Temp:    97.7 F (36.5 C)  TempSrc:      SpO2:   100% 99%  Weight:      Height:       Eyes: Anicteric no pallor. ENMT: No discharge from the ears eyes nose or mouth. Neck: No mass felt.  No neck rigidity.  No JVD appreciated. Respiratory: No rhonchi or crepitations. Cardiovascular: S1-S2 heard. Abdomen: Left inguinal hernia seen.  No definite signs of obstruction.  Bowel sounds present.  No rigidity no rebound tenderness. Musculoskeletal: Bilateral lower extremity edema present. Skin: No rash. Neurologic: Alert awake oriented to time place and person.  Moves all extremities.  Has difficulty moving lower extremities due to pain. Psychiatric: Appears normal.  Labs on Admission: I have personally reviewed following labs and imaging studies  CBC: Recent Labs  Lab 07/30/2018 2210  WBC 9.7  NEUTROABS 8.0*  HGB 15.5  HCT 48.0  MCV 96.2  PLT 141*   Basic Metabolic Panel: Recent Labs  Lab 08/01/2018 2210  NA 134*  K 5.0  CL 101  CO2 13*  GLUCOSE 160*  BUN 64*  CREATININE 2.29*  CALCIUM 8.7*   GFR: Estimated Creatinine Clearance: 31.1 mL/min (A) (by C-G formula based on SCr of 2.29 mg/dL (H)). Liver Function Tests: Recent Labs  Lab 07/26/2018 2210  AST 44*  ALT QUANTITY NOT SUFFICIENT, UNABLE TO PERFORM TEST  ALKPHOS 271*  BILITOT 2.9*  PROT 5.7*  ALBUMIN 3.4*   No results for input(s): LIPASE, AMYLASE in the last 168 hours. No results for input(s): AMMONIA in the last 168 hours. Coagulation Profile: Recent Labs  Lab 07/07/2018 2210  INR 1.60   Cardiac Enzymes: Recent Labs  Lab 07/30/2018 2210  TROPONINI 0.06*   BNP (last 3 results) No results for input(s): PROBNP in the last 8760 hours. HbA1C: No results for input(s): HGBA1C in the last 72 hours. CBG: Recent Labs  Lab 07/15/2018 2250  GLUCAP 117*   Lipid Profile: No results for input(s): CHOL, HDL, LDLCALC, TRIG,  CHOLHDL, LDLDIRECT in the last 72 hours. Thyroid Function Tests: No results for input(s): TSH, T4TOTAL, FREET4, T3FREE, THYROIDAB in the last 72 hours. Anemia Panel: No results for input(s): VITAMINB12, FOLATE, FERRITIN, TIBC, IRON, RETICCTPCT in the last 72 hours. Urine analysis:    Component Value Date/Time   COLORURINE YELLOW 07/25/2018 0100   APPEARANCEUR CLEAR 07/25/2018 0100   LABSPEC 1.025 07/25/2018 0100   PHURINE 5.0 07/25/2018 0100   GLUCOSEU NEGATIVE 07/25/2018 0100   HGBUR NEGATIVE 07/25/2018 0100   BILIRUBINUR MODERATE (A) 07/25/2018 0100   KETONESUR 15 (A) 07/25/2018 0100   PROTEINUR NEGATIVE 07/25/2018 0100   NITRITE NEGATIVE 07/25/2018 0100   LEUKOCYTESUR NEGATIVE 07/25/2018 0100   Sepsis Labs: @LABRCNTIP (procalcitonin:4,lacticidven:4) )No results found for this or any previous visit (from the past 240 hour(s)).   Radiological Exams on Admission: Dg Chest 2 View  Result Date: 07/05/2018 CLINICAL DATA:  Fall EXAM: CHEST - 2 VIEW COMPARISON:  06/05/2016 FINDINGS: Small right-sided pleural effusion. Hazy atelectasis or infiltrate at the right base. Cardiomegaly with vascular congestion. Aortic atherosclerosis. No pneumothorax. IMPRESSION: 1. Small right-sided pleural effusion with atelectasis or infiltrate at the right base 2. Cardiomegaly with mild central vascular congestion Electronically Signed   By: Jasmine Pang M.D.   On: 08/04/2018 22:21   Dg Lumbar Spine Complete  Result Date: 08/01/2018 CLINICAL DATA:  Fall with low back pain EXAM: LUMBAR SPINE - COMPLETE 4+ VIEW COMPARISON:  None. FINDINGS: Lumbar alignment is within normal limits. Vertebral body heights are maintained. Aortic atherosclerosis. Mild degenerative change L1-L2, L2-L3. Mild degenerative change L5-S1. Posterior facet degenerative change L4 through S1. IMPRESSION: 1. No acute osseous abnormality. 2. Multiple level degenerative changes Electronically Signed   By: Jasmine Pang M.D.   On: 07/20/2018  22:23   Dg Shoulder Left  Result Date: 08/01/2018 CLINICAL DATA:  Fall with shoulder pain EXAM: LEFT SHOULDER - 2+ VIEW COMPARISON:  None. FINDINGS: There is no evidence of fracture or dislocation. There is no evidence of arthropathy or other focal bone abnormality. Soft tissues are unremarkable. IMPRESSION: Negative. Electronically Signed   By: Jasmine Pang M.D.   On: 07/30/2018 22:23    EKG: Independently reviewed.  Sinus tachycardia with LBBB.  Assessment/Plan Principal Problem:   Hypotension Active Problems:   Nonischemic dilated cardiomyopathy (HCC)   SYSTOLIC HEART FAILURE, CHRONIC   Near syncope   Anasarca   AKI (acute kidney injury) (HCC)   Nausea & vomiting   Elevated LFTs    1. Hypotension -primary concern is either patient is dehydrated or possible developing shock from cardiogenic cause or hypovolemic.  Discussed with both pulmonary critical care and cardiology.  At this time they requested to continue hydration recheck 2D echo cycle cardiac markers.  Patient does not look septic.  Will repeat labs including lactate CBC metabolic panel troponin BNP check 2D echo. 2. Nausea vomiting and left inguinal hernia for which I have ordered CT abdomen pelvis with p.o. contrast. 3. Acute renal failure could be from dehydration or developing shock.  UA unremarkable.  Continue hydration follow metabolic panel. 4. Elevated LFTs -repeat LFTs.  Could be from hypotension./Acute hepatitis panel.  Follow CAT scan. 5. Thrombocytopenia -check LDH for any hemolytic process.  Check smear review follow CBC with differential. 6. Anasarca with nonischemic cardiomyopathy see #1.  If patient becomes hypoxic or if 2D echo shows worsening EF may need vasopressors and hold fluids. 7. Falls with low back pain near syncope likely from hypotension and dehydration.  Will eventually need physical therapy consult. 8. Diabetes mellitus type 2 we will keep patient on sliding scale coverage. 9. History of atrial  fibrillation has refused anticoagulation previously.  Presently in the low normal blood so no rate limiting medications were added.  I have discussed this case with on-call cardiologist and pulmonary critical care. Repeat labs are pending including complete metabolic panel troponin BNP CBC LDH smear review CT abdomen pelvis CT head. Patient has difficulty moving his lower extremities which I think is from pain in the low back edema, will also check CT Abdomen Pelvis and Head.  Since patient's blood pressure remains hypotensive in the 90s I have consulted critical care since patient may need vasopressors and may not be able recommended more fluids given the elevated BNP and peripheral edema.   DVT prophylaxis: Heparin. Code Status: Full code. Family Communication: No family at the bedside. Disposition Plan: To be determined. Consults called: Cardiology. Admission status: Inpatient.   Eduard Clos MD [facility] Pager [phone].  If 7PM-7AM, please contact night-coverage [email or web address] Password [facility]  07/25/2018, 4:15 AM

## 2018-07-25 NOTE — Progress Notes (Signed)
Notified Kakrakandy,MD of patient's BP 84/58 with a MAP of 67. Kakrakandy,MD gave verbal order for patient to receive a 500 mL bolus and increase his maintenance fluids from 75 to 100 mL/hr. Will continue to monitor and treat per MD orders.

## 2018-07-25 NOTE — Consult Note (Signed)
NAME:  [name], MRN:  [id number], DOB:  May 04, 1946, LOS: 0 ADMISSION DATE:  07/22/2018, CONSULTATION DATE: 07/25/2018 REFERRING MD: 07/25/2018, CHIEF COMPLAINT: Fall  Brief History   72 year old admitted with nausea vomiting hypotension in the setting of nonischemic cardiomyopathy.  History of present illness   [name] is a 72 year old male with extensive past medical history of that includes but not exclusive of nonischemic cardiomyopathy with reported ejection fraction 40%, had an I CD that was removed in 2018 secondary to infection.  He also has a history of paroxysmal atrial fibrillation for which she refuses anticoagulation.  Also history of diabetes mellitus with poor compliance.  He was in his usual state of poor health until approximately 1 week ago which time he noticed nausea and vomiting and general discomfort.  He continued to take his medications that included ACE inhibitor's, antihypertensive, and diuretics.  He presented to [facility] after 2 falls.  Was hypotensive but with the institution of IV fluids systolic blood pressure is now greater than 90 and heart rate is slowed down to 113 is awake and in no acute distress.  Pulmonary critical care was asked to evaluate due to hypotension.  This problem currently has been resolved with fluids.  Cardiology is now following him.  Pulmonary critical care will be available as needed.  Past Medical History  Nonischemic cardiomyopathy with EF noted to be 40% ICD removed in 2018 secondary infection Diabetes mellitus with poor compliance Paroxysmal atrial fibrillation Coronary artery disease Reported obstructive sleep apnea Reported full compliance with medications Refused anticoagulation for atrial fibrillation  Significant Hospital Events   07/11/2018 hypotension that resolved with IV fluids  Consults:  07/29/2018 cardiology 07/25/2018 pulmonary critical care  Procedures:    Significant Diagnostic Tests:    1220 one 2D echo>>  Micro Data:    Antimicrobials:     Interim history/subjective:  Initially hypotensive but received 500 cc of IV fluids and now with systolic blood pressures greater than 90  Objective   Blood pressure (!) 85/69, pulse (!) 116, temperature (!) 96.5 F (35.8 C), temperature source Axillary, resp. rate (!) 27, height 6' (1.829 m), weight 79.4 kg, SpO2 98 %.        Intake/Output Summary (Last 24 hours) at 07/25/2018 1505 Last data filed at 07/25/2018 1400 Gross per 24 hour  Intake 1653.24 ml  Output 600 ml  Net 1053.24 ml   Filed Weights   07/22/2018 2047  Weight: 79.4 kg    Examination: General: Lethargic disheveled male HENT: No JVD or lymphadenopathy is appreciated Lungs: Decreased air movement to bases Cardiovascular: Heart sounds are distant Abdomen: Obese, soft, faint bowel sounds Extremities: 3+ edema to the thighs Neuro: Lethargic but follows commands moves all extremities GU: Difficulty voiding  Resolved Hospital Problem list    Assessment & Plan:  Hypotension in the setting of congestive heart failure with EF of 40% with a previous ICD that was removed for infection in 2018, hypertension, antihypertensives and noted to have nausea and vomiting 1 week prior to admission.  Most likely hemodynamically unstable secondary to dehydration and with renal insufficiency secondary to ACE inhibitor and dehydration. -Agree with gentle rehydration -Agree with holding anti-antihypertensives -Currently with systolic blood pressure greater than 90 -No overt evidence of infection is noted with normal white count and procalcitonin   Nonischemic dilated cardiomyopathy, history of paroxysmal atrial fibrillation with noncompliance with anticoagulation, EF last noted to be 40%, history of ICD that was removed secondary to infection 2018. -  Cardiology is been consulted -2D echo is pending -Continue to hold ACE inhibitors and all antihypertensives at this  time  Diabetes mellitus with poor compliance -Sliding scale insulin per primary  Recent fall with reported 2 instances prior to admission without reported injury -Cardiology work-up for syncope -Most likely secondary to nausea vomiting and dehydration  Lethargy -Most likely secondary to current dehydration and general malaise.  Negative head CT -Note he carries a diagnosis of obstructive sleep apnea and does not appear to be utilized in CPAP machine.  There is a possibility is that 3 may be secondary to hypoxia/hypercapnia. -Continue oxygen -Consider nocturnal CPAP   Discussion: Agree with current interventions.  He was sick for a week prior to admission.  Noted to have nausea and vomiting.  Continue to take his antihypertensive and diuretics most likely was severely dehydrated.  Gentle hydration.  Cardiology is involved.  Pulmonary critical care will be available as needed.  He does not appear to need ICU or further critical interventions at this time.  Best practice:  Diet: Clear liquids Pain/Anxiety/Delirium protocol (if indicated): None indicated VAP protocol (if indicated): None indicated DVT prophylaxis: Subcu heparin GI prophylaxis: None Glucose control: Sliding scale insulin Mobility: Currently on bedrest Code Status: Full Family Communication: 07/25/2018 patient updated no family at bedside Disposition: floor  Labs   CBC: Recent Labs  Lab 2018-07-29 2210 07/25/18 0426  WBC 9.7 8.5  NEUTROABS 8.0* 6.7  HGB 15.5 14.7  HCT 48.0 43.8  MCV 96.2 94.4  PLT 141* 145*    Basic Metabolic Panel: Recent Labs  Lab 07/29/18 2210 07/25/18 0426  NA 134* 135  K 5.0 4.1  CL 101 101  CO2 13* 19*  GLUCOSE 160* 154*  BUN 64* 61*  CREATININE 2.29* 2.28*  CALCIUM 8.7* 8.4*   GFR: Estimated Creatinine Clearance: 31.2 mL/min (A) (by C-G formula based on SCr of 2.28 mg/dL (H)). Recent Labs  Lab Jul 29, 2018 2210 07/25/18 0426 07/25/18 0434 07/25/18 0659  PROCALCITON  --    --  0.21  --   WBC 9.7 8.5  --   --   LATICACIDVEN  --  2.8*  --  2.6*    Liver Function Tests: Recent Labs  Lab 07/29/2018 2210 07/25/18 0426  AST 44* 33  ALT QUANTITY NOT SUFFICIENT, UNABLE TO PERFORM TEST 98*  ALKPHOS 271* 263*  BILITOT 2.9* 2.5*  PROT 5.7* 5.2*  ALBUMIN 3.4* 3.0*   No results for input(s): LIPASE, AMYLASE in the last 168 hours. No results for input(s): AMMONIA in the last 168 hours.  ABG    Component Value Date/Time   PHART 7.518 (H) 08/07/2007 1442   PCO2ART 33.5 (L) 08/07/2007 1442   PO2ART 70.0 (L) 08/07/2007 1442   HCO3 27.2 (H) 08/07/2007 1442   TCO2 28 08/07/2007 1442   O2SAT 96.0 08/07/2007 1442     Coagulation Profile: Recent Labs  Lab 07/29/18 2210  INR 1.60    Cardiac Enzymes: Recent Labs  Lab 07-29-18 2210 07/25/18 0426 07/25/18 0905  TROPONINI 0.06* 0.08* 0.07*    HbA1C: Hgb A1c MFr Bld  Date/Time Value Ref Range Status  07/25/2018 04:26 AM 7.6 (H) 4.8 - 5.6 % Final    Comment:    (NOTE) Pre diabetes:          5.7%-6.4% Diabetes:              >6.4% Glycemic control for   <7.0% adults with diabetes     CBG: Recent Labs  Lab  08-23-2018 2250 07/25/18 0812 07/25/18 1229  GLUCAP 117* 143* 145*    Review of Systems:   10 point review of system taken, please see HPI for positives and negatives.   Past Medical History  He,  has a past medical history of Atrial fibrillation (HCC), CAD (coronary artery disease), Chronic systolic heart failure (HCC), Deafness in right ear, Essential hypertension, Hyperlipidemia, Infection involving implantable cardioverter-defibrillator (ICD) (HCC), LBBB (left bundle branch block), Nonischemic cardiomyopathy (HCC), Pneumonia, and Type 2 diabetes mellitus (HCC).   Surgical History    Past Surgical History:  Procedure Laterality Date  . APPENDECTOMY    . CARDIAC DEFIBRILLATOR PLACEMENT  12/14/2007; 11/23/13   [employer] Jude Promote RF 3207 ICD placed by Dr. Graciela Husbands for CHF, nonischemic  cardiomyopathy; gen change 11/2013 by Dr Johney Frame STJ  . CARDIAC DEFIBRILLATOR REMOVAL  06/04/2016  . CATARACT EXTRACTION W/PHACO Right 06/14/2013   Procedure: CATARACT EXTRACTION PHACO AND INTRAOCULAR LENS PLACEMENT (IOC);  Surgeon: Gemma Payor, MD;  Location: [facility];  Service: Ophthalmology;  Laterality: Right;  CDE:  21.74  . CATARACT EXTRACTION W/PHACO Left 07/08/2013   Procedure: CATARACT EXTRACTION PHACO AND INTRAOCULAR LENS PLACEMENT (IOC);  Surgeon: Gemma Payor, MD;  Location: [facility];  Service: Ophthalmology;  Laterality: Left;  CDE:24.66  . CHOLECYSTECTOMY    . Fiscula Repair    . Hearing Surgery    . HERNIA REPAIR    . ICD LEAD REMOVAL N/A 06/04/2016   Procedure: ICD LEAD REMOVAL;  Surgeon: Marinus Maw, MD;  Location: [facility];  Service: Cardiovascular;  Laterality: N/A;  [place]wen to back up  . IMPLANTABLE CARDIOVERTER DEFIBRILLATOR GENERATOR CHANGE N/A 11/23/2013   Procedure: IMPLANTABLE CARDIOVERTER DEFIBRILLATOR GENERATOR CHANGE;  Surgeon: Gardiner Rhyme, MD;  Location: [facility] C[facility] LAB;  Service: Cardiovascular;  Laterality: N/A;  . TEE WITHOUT CARDIOVERSION N/A 06/04/2016   Procedure: TRANSESOPHAGEAL ECHOCARDIOGRAM (TEE);  Surgeon: Marinus Maw, MD;  [facility];  Service: Cardiovascular;  Laterality: N/A;     Social History   reports that he quit smoking about 53 years ago. His smoking use included cigarettes and cigars. He started smoking about 59 years ago. He has a 1.50 pack-year smoking history. He has never used smokeless tobacco. He reports that he does not drink alcohol or use drugs.   Family History   His family history includes Cancer in an other family member; Coronary artery disease in an other family member; Diabetes in an other family member.   Allergies Allergies  Allergen Reactions  . Penicillins Hives, Rash and Other (See Comments)    Has patient had a PCN reaction causing immediate rash, facial/tongue/throat swelling, SOB or lightheadedness with hypotension:  Yes Has patient had a PCN reaction causing severe rash involving mucus membranes or skin necrosis: No Has patient had a PCN reaction that required hospitalization No Has patient had a PCN reaction occurring within the last 10 years: No If all of the above answers are "NO", then may proceed with Cephalosporin use.      Home Medications  Prior to Admission medications   Medication Sig Start Date End Date Taking? Authorizing Provider  carvedilol (COREG) 25 MG tablet TAKE 1 TABLET TWICE DAILY  WITH  MEALS Patient not taking: Reported on 2018-08-23 07/24/17   Jonelle Sidle, MD  digoxin (LANOXIN) 0.125 MG tablet TAKE 1 TABLET EVERY DAY Patient not taking: Reported on 08/23/18 07/24/17   Jonelle Sidle, MD  furosemide (LASIX) 40 MG tablet TAKE 1/2 TABLET TWICE DAILY Patient not taking:  Reported on Mar 07, 2018 07/24/17   Jonelle SidleMcDowell, Samuel G, MD  lisinopril (PRINIVIL,ZESTRIL) 10 MG tablet TAKE 1 TABLET EVERY DAY Patient not taking: Reported on Mar 07, 2018 07/24/17   Jonelle SidleMcDowell, Samuel G, MD  simvastatin (ZOCOR) 40 MG tablet TAKE 1 TABLET AT BEDTIME Patient not taking: Reported on Mar 07, 2018 07/24/17   Jonelle SidleMcDowell, Samuel G, MD         Brett CanalesSteve Braylynn Lewing ACNP [facility] Pager [phone] till 1 pm If no answer page [phone] 07/25/2018, 3:05 PM

## 2018-07-25 NOTE — Progress Notes (Signed)
Patient complained of pressure like he had to urinate. Bladder scan only read 87 mL. Dr. Thedore Mins notified, said to go ahead and straight cath. 300 mL of urine removed. Will continue to monitor and repeat bladder scans q4 hours.

## 2018-07-25 NOTE — Progress Notes (Signed)
CRITICAL VALUE ALERT  Critical Value:  Lactic Acid 2.8  Date & Time Notied:  12/21 at 0615  Provider Notified: Julious Oka  Orders Received/Actions taken: No new orders at this time. Per Julious Oka pt has continuous fluids infusing and he is going to consult critical care at this time.

## 2018-07-25 NOTE — Progress Notes (Signed)
CRITICAL VALUE ALERT  Critical Value:  Lactic acid 2.6  Date & Time Notied:  07/25/18 at 0820  Provider Notified: Thedore Mins  Orders Received/Actions taken: Fluid bolus

## 2018-07-25 NOTE — Progress Notes (Signed)
PT Cancellation Note  Patient Details Name: [name] MRN: [id number] DOB: 1946/01/21   Cancelled Treatment:    Reason Eval/Treat Not Completed: Fatigue/lethargy limiting ability to participate  Attempted to see the patient for initial evaluation. Patient initially reported he needed an in/out cath. Therapy could not find nursing. When therapy returned to his room he refused stating he had too many tests and things already today. He agreed to work with therapy tomorrow 12/22.   Dessie Coma 07/25/2018, 1:41 PM

## 2018-07-25 NOTE — Progress Notes (Signed)
Patient was retaining greater than 200 mL of urine in his bladder per bladder scanner. Kakrakandy,MD gave a continuous verbal order for RN to perform an in and out cath on patient if he was retaining more than . RN performed an in & out cath and 300 mL of amber urine was drained from the patient's bladder. Kakrakandy,MD also gave verbal order for patient to be bladder scanned every 4 hours. Patient bladder scanned at 0530 and was only retaining 37 mL of urine. Will continue to monitor and treat per MD orders.

## 2018-07-25 NOTE — Consult Note (Signed)
Cardiology Consultation:   Patient ID: [name] MRN: [id number]; DOB: February 29, 1946  Admit date: 07/20/2018 Date of Consult: 07/25/2018  Primary Care Provider: [name], MD Primary Cardiologist Molli Hazard  Primary Electrophysiologist:  Ladona Ridgel   Patient Profile:   [name] is a 72 y.o. male with a hx of DCM, AICD, PAF who is being seen today for the evaluation of arrhythmia and ? CHF  at the request of hospitalist.  History of Present Illness:   [name] 72 y.o. with non ischemic DCM, Bi V AICD removed for infection 2017 Last EF by TTE 40-45% 08/2016 Also history of PAF declined anticoagulation. Does not f/u with MD;s and has not taken meds in over a year. Sits In a chair all day. Brought in by family with FTT and poor PO intake. He denies cardiac complaints. Lower back hurts No chest pain palpitations or dyspnea. He has refused anticoagulation in past and with falls not a candidate. Admitted With low BP and pre renal azotemia    Past Medical History:  Diagnosis Date  . Atrial fibrillation (HCC)    Declines anticoagulation  . CAD (coronary artery disease)    Minimal nonobstructive CAD at catheterization 2009  . Chronic systolic heart failure (HCC)   . Deafness in right ear   . Essential hypertension   . Hyperlipidemia   . Infection involving implantable cardioverter-defibrillator (ICD) [facility])    St. Jude biventricular ICD, device extraction November 2017 - Dr. Ladona Ridgel  . LBBB (left bundle branch block)   . Nonischemic cardiomyopathy (HCC)   . Pneumonia   . Type 2 diabetes mellitus (HCC)     Past Surgical History:  Procedure Laterality Date  . APPENDECTOMY    . CARDIAC DEFIBRILLATOR PLACEMENT  12/14/2007; 11/23/13   St. Jude Promote RF 3207 ICD placed by Dr. Graciela Husbands for CHF, nonischemic cardiomyopathy; gen change 11/2013 by Dr Johney Frame STJ  . CARDIAC DEFIBRILLATOR REMOVAL  06/04/2016  . CATARACT EXTRACTION W/PHACO Right 06/14/2013   Procedure:  CATARACT EXTRACTION PHACO AND INTRAOCULAR LENS PLACEMENT (IOC);  Surgeon: Gemma Payor, MD;  Location: [facility];  Service: Ophthalmology;  Laterality: Right;  CDE:  21.74  . CATARACT EXTRACTION W/PHACO Left 07/08/2013   Procedure: CATARACT EXTRACTION PHACO AND INTRAOCULAR LENS PLACEMENT (IOC);  Surgeon: Gemma Payor, MD;  Location: [facility];  Service: Ophthalmology;  Laterality: Left;  CDE:24.66  . CHOLECYSTECTOMY    . Fiscula Repair    . Hearing Surgery    . HERNIA REPAIR    . ICD LEAD REMOVAL N/A 06/04/2016   Procedure: ICD LEAD REMOVAL;  Surgeon: Marinus Maw, MD;  Location: [facility] OR;  Service: Cardiovascular;  Laterality: N/A;  [place]wen to back up  . IMPLANTABLE CARDIOVERTER DEFIBRILLATOR GENERATOR CHANGE N/A 11/23/2013   Procedure: IMPLANTABLE CARDIOVERTER DEFIBRILLATOR GENERATOR CHANGE;  Surgeon: Gardiner Rhyme, MD;  Location: [facility] CATH LAB;  Service: Cardiovascular;  Laterality: N/A;  . TEE WITHOUT CARDIOVERSION N/A 06/04/2016   Procedure: TRANSESOPHAGEAL ECHOCARDIOGRAM (TEE);  Surgeon: Marinus Maw, MD;  Location: [facility] OR;  Service: Cardiovascular;  Laterality: N/A;     Home Medications:  Prior to Admission medications   Medication Sig Start Date End Date Taking? Authorizing Provider  carvedilol (COREG) 25 MG tablet TAKE 1 TABLET TWICE DAILY  WITH  MEALS Patient not taking: Reported on 07/05/2018 07/24/17   Jonelle Sidle, MD  digoxin (LANOXIN) 0.125 MG tablet TAKE 1 TABLET EVERY DAY Patient not taking: Reported on 07/12/2018 07/24/17   Jonelle Sidle,  MD  furosemide (LASIX) 40 MG tablet TAKE 1/2 TABLET TWICE DAILY Patient not taking: Reported on 07/23/2018 07/24/17   Jonelle Sidle, MD  lisinopril (PRINIVIL,ZESTRIL) 10 MG tablet TAKE 1 TABLET EVERY DAY Patient not taking: Reported on 07/27/2018 07/24/17   Jonelle Sidle, MD  simvastatin (ZOCOR) 40 MG tablet TAKE 1 TABLET AT BEDTIME Patient not taking: Reported on 07/26/2018 07/24/17   Jonelle Sidle, MD    Inpatient  Medications: Scheduled Meds: . aspirin  81 mg Oral Daily  . atorvastatin  40 mg Oral q1800  . digoxin  0.125 mg Intravenous Q6H  . heparin  5,000 Units Subcutaneous Q8H  . insulin aspart  0-9 Units Subcutaneous TID WC   Continuous Infusions: . lactated ringers 75 mL/hr at 07/25/18 1016   PRN Meds: acetaminophen **OR** acetaminophen, ondansetron **OR** ondansetron (ZOFRAN) IV  Allergies:    Allergies  Allergen Reactions  . Penicillins Hives, Rash and Other (See Comments)    Has patient had a PCN reaction causing immediate rash, facial/tongue/throat swelling, SOB or lightheadedness with hypotension: Yes Has patient had a PCN reaction causing severe rash involving mucus membranes or skin necrosis: No Has patient had a PCN reaction that required hospitalization No Has patient had a PCN reaction occurring within the last 10 years: No If all of the above answers are "NO", then may proceed with Cephalosporin use.     Social History:   Social History   Socioeconomic History  . Marital status: Married    Spouse name: Not on file  . Number of children: Not on file  . Years of education: Not on file  . Highest education level: Not on file  Occupational History  . Occupation: Retired  Engineer, production  . Financial resource strain: Not on file  . Food insecurity:    Worry: Not on file    Inability: Not on file  . Transportation needs:    Medical: Not on file    Non-medical: Not on file  Tobacco Use  . Smoking status: Former Smoker    Packs/day: 0.30    Years: 5.00    Pack years: 1.50    Types: Cigarettes, Cigars    Start date: 02/15/1959    Last attempt to quit: 08/05/1965    Years since quitting: 53.0  . Smokeless tobacco: Never Used  Substance and Sexual Activity  . Alcohol use: No    Alcohol/week: 0.0 standard drinks  . Drug use: No  . Sexual activity: Yes    Birth control/protection: None  Lifestyle  . Physical activity:    Days per week: Not on file    Minutes per  session: Not on file  . Stress: Not on file  Relationships  . Social connections:    Talks on phone: Not on file    Gets together: Not on file    Attends religious service: Not on file    Active member of club or organization: Not on file    Attends meetings of clubs or organizations: Not on file    Relationship status: Not on file  . Intimate partner violence:    Fear of current or ex partner: Not on file    Emotionally abused: Not on file    Physically abused: Not on file    Forced sexual activity: Not on file  Other Topics Concern  . Not on file  Social History Narrative  . Not on file    Family History:    Family History  Problem  Relation Age of Onset  . Cancer Other   . Coronary artery disease Other   . Diabetes Other      ROS:  Please see the history of present illness.   All other ROS reviewed and negative.     Physical Exam/Data:   Vitals:   07/25/18 0618 07/25/18 0619 07/25/18 0759 07/25/18 1116  BP: 93/64 106/71 106/78 (!) 85/69  Pulse:    (!) 116  Resp:    (!) 27  Temp:   (!) 97.3 F (36.3 C)   TempSrc:   Oral   SpO2:    98%  Weight:      Height:        Intake/Output Summary (Last 24 hours) at 07/25/2018 1215 Last data filed at 07/25/2018 0759 Gross per 24 hour  Intake 1653.24 ml  Output 300 ml  Net 1353.24 ml   Filed Weights   07/16/2018 2047  Weight: 79.4 kg   Body mass index is 23.73 kg/m.  Chronically ill male  HEENT: normal Lymph: no adenopathy Neck: no JVD Endocrine:  No thryomegaly Vascular: No carotid bruits; FA pulses 2+ bilaterally without bruits  Cardiac:  normal S1, S2; RRR; no murmur  Scar below left clavicle from AICD removal  Lungs:  clear to auscultation bilaterally, no wheezing, rhonchi or rales  Abd: soft, nontender, no hepatomegaly  Ext: plus one bilateral edema  Musculoskeletal:  No deformities, BUE and BLE strength normal and equal Skin: warm and dry  Neuro:  CNs 2-12 intact, no focal abnormalities noted Psych:   Normal affect   EKG:  The EKG was personally reviewed and demonstrates:  Afib/fluter rate 118 Telemetry:  Telemetry was personally reviewed and demonstrates:  Afib with PVC;s   Relevant CV Studies: TTE pending   Laboratory Data:  Chemistry Recent Labs  Lab 07/20/2018 2210 07/25/18 0426  NA 134* 135  K 5.0 4.1  CL 101 101  CO2 13* 19*  GLUCOSE 160* 154*  BUN 64* 61*  CREATININE 2.29* 2.28*  CALCIUM 8.7* 8.4*  GFRNONAA 27* 27*  GFRAA 31* 32*  ANIONGAP 20* 15    Recent Labs  Lab 07/22/2018 2210 07/25/18 0426  PROT 5.7* 5.2*  ALBUMIN 3.4* 3.0*  AST 44* 33  ALT QUANTITY NOT SUFFICIENT, UNABLE TO PERFORM TEST 98*  ALKPHOS 271* 263*  BILITOT 2.9* 2.5*   Hematology Recent Labs  Lab 07/13/2018 2210 07/25/18 0426  WBC 9.7 8.5  RBC 4.99 4.64  HGB 15.5 14.7  HCT 48.0 43.8  MCV 96.2 94.4  MCH 31.1 31.7  MCHC 32.3 33.6  RDW 15.2 15.2  PLT 141* 145*   Cardiac Enzymes Recent Labs  Lab 07/19/2018 2210 07/25/18 0426 07/25/18 0905  TROPONINI 0.06* 0.08* 0.07*   No results for input(s): TROPIPOC in the last 168 hours.  BNP Recent Labs  Lab 07/25/18 0426  BNP 1,351.7*    DDimer No results for input(s): DDIMER in the last 168 hours.  Radiology/Studies:  Dg Chest 2 View  Result Date: 07/21/2018 CLINICAL DATA:  Fall EXAM: CHEST - 2 VIEW COMPARISON:  06/05/2016 FINDINGS: Small right-sided pleural effusion. Hazy atelectasis or infiltrate at the right base. Cardiomegaly with vascular congestion. Aortic atherosclerosis. No pneumothorax. IMPRESSION: 1. Small right-sided pleural effusion with atelectasis or infiltrate at the right base 2. Cardiomegaly with mild central vascular congestion Electronically Signed   By: Jasmine PangKim  Fujinaga M.D.   On: 07/06/2018 22:21   Dg Lumbar Spine Complete  Result Date: 07/27/2018 CLINICAL DATA:  Fall with low back  pain EXAM: LUMBAR SPINE - COMPLETE 4+ VIEW COMPARISON:  None. FINDINGS: Lumbar alignment is within normal limits. Vertebral body heights  are maintained. Aortic atherosclerosis. Mild degenerative change L1-L2, L2-L3. Mild degenerative change L5-S1. Posterior facet degenerative change L4 through S1. IMPRESSION: 1. No acute osseous abnormality. 2. Multiple level degenerative changes Electronically Signed   By: Jasmine Pang M.D.   On: 07/15/2018 22:23   Dg Shoulder Left  Result Date: 07/11/2018 CLINICAL DATA:  Fall with shoulder pain EXAM: LEFT SHOULDER - 2+ VIEW COMPARISON:  None. FINDINGS: There is no evidence of fracture or dislocation. There is no evidence of arthropathy or other focal bone abnormality. Soft tissues are unremarkable. IMPRESSION: Negative. Electronically Signed   By: Jasmine Pang M.D.   On: 07/22/2018 22:23    Assessment and Plan:   1. Afib/Flutter:  Not a candidate for anticoagulation due to non compliance and falls. Would use [place] heparin Start Low dose coreg and consider amiodarone if BP does not improve with hydration. TTE pending  2. CHF:  Small right pleural effusion on CXR  BNP 1351 but needs hydration clinically no respiratory distress Hopefully  TTE will not show worsening EF 3. CRF:  Family indicates limited PO intake and FTT needs home health and goals of care discussion CT abdomen And pelvis pending follow labs       For questions or updates, please contact [facility] Please consult [email or web address] for contact info under     Signed, Charlton Haws, MD  07/25/2018 12:15 PM

## 2018-07-26 ENCOUNTER — Inpatient Hospital Stay (HOSPITAL_COMMUNITY): Payer: Medicare HMO

## 2018-07-26 DIAGNOSIS — R579 Shock, unspecified: Secondary | ICD-10-CM

## 2018-07-26 DIAGNOSIS — R570 Cardiogenic shock: Principal | ICD-10-CM

## 2018-07-26 DIAGNOSIS — I959 Hypotension, unspecified: Secondary | ICD-10-CM

## 2018-07-26 DIAGNOSIS — I9589 Other hypotension: Secondary | ICD-10-CM

## 2018-07-26 LAB — CBC
HCT: 46.2 % (ref 39.0–52.0)
Hemoglobin: 15.3 g/dL (ref 13.0–17.0)
MCH: 31.3 pg (ref 26.0–34.0)
MCHC: 33.1 g/dL (ref 30.0–36.0)
MCV: 94.5 fL (ref 80.0–100.0)
PLATELETS: 150 10*3/uL (ref 150–400)
RBC: 4.89 MIL/uL (ref 4.22–5.81)
RDW: 15.1 % (ref 11.5–15.5)
WBC: 9.6 10*3/uL (ref 4.0–10.5)
nRBC: 0.2 % (ref 0.0–0.2)

## 2018-07-26 LAB — COOXEMETRY PANEL
Carboxyhemoglobin: 1.2 % (ref 0.5–1.5)
METHEMOGLOBIN: 1.4 % (ref 0.0–1.5)
O2 Saturation: 66.6 %
Total hemoglobin: 14.4 g/dL (ref 12.0–16.0)

## 2018-07-26 LAB — COMPREHENSIVE METABOLIC PANEL
ALT: 142 U/L — ABNORMAL HIGH (ref 0–44)
AST: 107 U/L — ABNORMAL HIGH (ref 15–41)
Albumin: 2.9 g/dL — ABNORMAL LOW (ref 3.5–5.0)
Alkaline Phosphatase: 360 U/L — ABNORMAL HIGH (ref 38–126)
Anion gap: 15 (ref 5–15)
BUN: 58 mg/dL — ABNORMAL HIGH (ref 8–23)
CO2: 19 mmol/L — AB (ref 22–32)
Calcium: 8.4 mg/dL — ABNORMAL LOW (ref 8.9–10.3)
Chloride: 99 mmol/L (ref 98–111)
Creatinine, Ser: 2.51 mg/dL — ABNORMAL HIGH (ref 0.61–1.24)
GFR calc Af Amer: 28 mL/min — ABNORMAL LOW (ref >60)
GFR calc non Af Amer: 24 mL/min — ABNORMAL LOW (ref >60)
Glucose, Bld: 133 mg/dL — ABNORMAL HIGH (ref 70–99)
Potassium: 4.3 mmol/L (ref 3.5–5.1)
SODIUM: 133 mmol/L — AB (ref 135–145)
Total Bilirubin: 3.3 mg/dL — ABNORMAL HIGH (ref 0.3–1.2)
Total Protein: 5 g/dL — ABNORMAL LOW (ref 6.5–8.1)

## 2018-07-26 LAB — GLUCOSE, CAPILLARY
GLUCOSE-CAPILLARY: 127 mg/dL — AB (ref 70–99)
GLUCOSE-CAPILLARY: 137 mg/dL — AB (ref 70–99)
Glucose-Capillary: 116 mg/dL — ABNORMAL HIGH (ref 70–99)
Glucose-Capillary: 155 mg/dL — ABNORMAL HIGH (ref 70–99)
Glucose-Capillary: 118 mg/dL — ABNORMAL HIGH (ref 70–99)

## 2018-07-26 LAB — LACTIC ACID, PLASMA: LACTIC ACID, VENOUS: 2.4 mmol/L — AB (ref 0.5–1.9)

## 2018-07-26 LAB — HEPATITIS PANEL, ACUTE
HCV Ab: <0.1 s/co ratio (ref 0.0–0.9)
HEP B C IGM: NEGATIVE
Hep A IgM: NEGATIVE
Hepatitis B Surface Ag: NEGATIVE

## 2018-07-26 LAB — MAGNESIUM: Magnesium: 2.5 mg/dL — ABNORMAL HIGH (ref 1.7–2.4)

## 2018-07-26 LAB — MRSA PCR SCREENING: MRSA by PCR: NEGATIVE

## 2018-07-26 MED ORDER — INSULIN ASPART 100 UNIT/ML ~~LOC~~ SOLN
1.0000 [IU] | SUBCUTANEOUS | Status: DC
  Administered 2018-07-26: 1 [IU] via SUBCUTANEOUS
  Administered 2018-07-26 – 2018-07-27 (×4): 2 [IU] via SUBCUTANEOUS
  Administered 2018-07-27 – 2018-07-28 (×2): 1 [IU] via SUBCUTANEOUS
  Administered 2018-07-28 (×2): 2 [IU] via SUBCUTANEOUS
  Administered 2018-07-28 (×3): 1 [IU] via SUBCUTANEOUS
  Administered 2018-07-29: 2 [IU] via SUBCUTANEOUS
  Administered 2018-07-29 – 2018-07-30 (×3): 1 [IU] via SUBCUTANEOUS

## 2018-07-26 MED ORDER — NOREPINEPHRINE BITARTRATE 1 MG/ML IV SOLN
0.0000 ug/min | INTRAVENOUS | Status: DC
  Administered 2018-07-26: 2 ug/min via INTRAVENOUS
  Administered 2018-07-27 (×2): 4 ug/min via INTRAVENOUS
  Administered 2018-07-28: 7 ug/min via INTRAVENOUS
  Administered 2018-07-29: 10 ug/min via INTRAVENOUS
  Filled 2018-07-26 (×7): qty 4

## 2018-07-26 MED ORDER — SODIUM CHLORIDE 0.9 % IV BOLUS
500.0000 mL | Freq: Once | INTRAVENOUS | Status: AC
  Administered 2018-07-26: 500 mL via INTRAVENOUS

## 2018-07-26 MED ORDER — ALBUMIN HUMAN 25 % IV SOLN
12.5000 g | Freq: Once | INTRAVENOUS | Status: AC
  Administered 2018-07-26: 12.5 g via INTRAVENOUS
  Filled 2018-07-26: qty 50

## 2018-07-26 MED ORDER — HEPARIN SODIUM (PORCINE) 5000 UNIT/ML IJ SOLN
5000.0000 [IU] | Freq: Three times a day (TID) | INTRAMUSCULAR | Status: DC
  Administered 2018-07-26 – 2018-07-30 (×12): 5000 [IU] via SUBCUTANEOUS
  Filled 2018-07-26 (×12): qty 1

## 2018-07-26 MED ORDER — "THROMBI-PAD 3""X3"" EX PADS"
1.0000 | MEDICATED_PAD | Freq: Once | CUTANEOUS | Status: DC
  Filled 2018-07-26: qty 1

## 2018-07-26 NOTE — Progress Notes (Signed)
Paged regarding pts hypotension with SBP in the 70's. Pt has a history of CHF with last EF of 40%. He has receieved approximately 3L of IV fluids and 12.5gm of Albumin. Pts BP has steadily dropped throughout the night despite intervention. Pt is Alert and Oriented x3 and is asymptomatic. He does however appear to be third spacing with +3 BLE pitting edema and +1-2 BUE edema. Spoke with cardiology who recommended a MAP greater than 60 and to continue Amiodarone gtt. Spoke with PCCM regarding pts hypotension and they recommended that the pt be transferred to an ICU possible placed on pressors. Transfer ordered was placed. Appreciate PCCM's help and recommendations.  Stevie Kern AGPCNP-BC, AGNP-C [facility] Pager [phone]

## 2018-07-26 NOTE — Progress Notes (Deleted)
NAME:  [name], MRN:  [id number], DOB:  23-Jan-1946, LOS: 1 ADMISSION DATE:  07/09/2018, CONSULTATION DATE:  07/25/2018 REFERRING MD:  [facility], CHIEF COMPLAINT:  hypotension  History of present illness   72 year old man with nonischemic cardiomyopathy, HFrEF (EF 40-45% on 09/04/2016), ICD removed in 2017 2/2 infection, paroxysmal atrial fibrillation not on anticoag, HTN, DM2 admitted on 07/27/2018 with N/V x 1 week and falls x 2. Was found to be hypotensive which responded to IV fluids. On morning of 12/22 was found to be hypotensive again and had received 3L of IV fluids and 12.5g albumin IV. Patient was transferred to ICU and started on levo gtt.   Past Medical History  Nonischemic cardiomyopathy ICD removed in 2018 secondary infection Diabetes mellitus Paroxysmal atrial fibrillation Coronary artery disease  Significant Hospital Events   12/20 admission 12/22 transfer to ICU  Consults:  Cardiology  Procedures:  12/22 RIJ CVC  Significant Diagnostic Tests:  CT abd/pelvis w/o contrast on 12/21: Bibasilar effusions and atelectasis greater on RIGHT. Diffuse soft tissue edema. Small volume ascites. Nonobstructed segment of sigmoid colon within a LEFT inguinal Hernia. Sigmoid diverticulosis with questionable wall thickening. Prostatic enlargement.  CT head w/o contrast on 12/21: no acute abnormalities, mild chronic small vessel changes  CXR on 12/20: Small right-sided pleural effusion with atelectasis or infiltrate at the right base, cardiomegaly with mild central vascular congestion  Micro Data:  None  Antimicrobials:  None  Interim history/subjective:  Reports lower back pain and some dyspnea. Also reports feeling drowsy. No new complaints otherwise.  Objective   Blood pressure (!) 71/48, pulse 97, temperature (!) 97.4 F (36.3 C), temperature source Rectal, resp. rate 16, height 6' (1.829 m), weight 79.4 kg, SpO2 95 %.        Intake/Output Summary  (Last 24 hours) at 07/26/2018 0516 Last data filed at 07/26/2018 0426 Gross per 24 hour  Intake 2632.8 ml  Output 300 ml  Net 2332.8 ml   Filed Weights   07/11/2018 2047  Weight: 79.4 kg   Examination: General: Mild distress, lying on side in bed HENT: Normocephalic, no scleral icterus Lungs: Clear to auscultation, no wheezes noted Cardiovascular: Regular rate and rhythm Abdomen: Soft, mild diffuse tenderness, nondistended Extremities: 3+ pitting edema to thighs bilaterally Neuro: Lethargic but follows commands. Oriented x 3. Moves all extremities  Assessment & Plan:  72 year old man with nonischemic cardiomyopathy, HFrEF (EF 40-45% on 09/04/2016), ICD removed in 2017 2/2 infection, paroxysmal atrial fibrillation not on anticoag, HTN, DM2 admitted on 12/20 with N/V x 1 week and falls x 2, found to be hypotensive now unresponsive to IV fluids. Transferred to ICU for pressor support.  Shock, possible cardiogenic, NICM, HFrEF: Unlikely to be septic given no leukocytosis and afebrile. Procal 0.21. UA neg. Hgb normal. Lactic acid yesterday 2.8>2.6. Possible component of hypovolemia but not responding to IV fluids now. TSH normal. Cortisol normal on 12/21. Troponin plateau at 0.07-0.08 on 12/21. BNP 1351.7. -Levo gtt titrate for goal MAP > 65 -Cardiology following -Obtain Co-ox -Follow up TTE -Home carvedilol, digoxin, Lasix, lisinopril held  AKI, Metabolic acidosis with mildly increased anion gap, mild hyponatremia: Creatinine 2.5, bicarb 19 and anion gap 15. Na 133. CT abd/pelvis w/o hydronephrosis. Probably pre-renal or ATN. Probably related to above. -Continue to follow -Holding lisinopril  Transaminitis: Alk phos 360, total bilirubin 3.3, AST 107 and ALT 142 on 12/22. Possible congestive hepatopathy.  -Follow up acute hepatitis panel  -Obtain ultrasound to evaluate for biliary obstruction  Atrial  fibrillation: rate controlled -Continue amio gtt  DM: A1c 7.6 -SSI  Best  practice:  Diet: NPO Pain/Anxiety/Delirium protocol (if indicated): Delirium precautions VAP protocol (if indicated): Not indicated DVT prophylaxis: subq hep GI prophylaxis: not indicated Glucose control: SSI Mobility: OOB Code Status: FULL Family Communication: None at bedside Disposition: ICU  Labs   CBC: Recent Labs  Lab 07/21/2018 2210 07/25/18 0426 07/26/18 0333  WBC 9.7 8.5 9.6  NEUTROABS 8.0* 6.7  --   HGB 15.5 14.7 15.3  HCT 48.0 43.8 46.2  MCV 96.2 94.4 94.5  PLT 141* 145* 935    Basic Metabolic Panel: Recent Labs  Lab 07/25/2018 2210 07/25/18 0426 07/26/18 0333  NA 134* 135 133*  K 5.0 4.1 4.3  CL 101 101 99  CO2 13* 19* 19*  GLUCOSE 160* 154* 133*  BUN 64* 61* 58*  CREATININE 2.29* 2.28* 2.51*  CALCIUM 8.7* 8.4* 8.4*  MG  --   --  2.5*   GFR: Estimated Creatinine Clearance: 28.3 mL/min (A) (by C-G formula based on SCr of 2.51 mg/dL (H)). Recent Labs  Lab 08/04/2018 2210 07/25/18 0426 07/25/18 0434 07/25/18 0659 07/26/18 0333  PROCALCITON  --   --  0.21  --   --   WBC 9.7 8.5  --   --  9.6  LATICACIDVEN  --  2.8*  --  2.6*  --     Liver Function Tests: Recent Labs  Lab 07/12/2018 2210 07/25/18 0426 07/26/18 0333  AST 44* 33 107*  ALT QUANTITY NOT SUFFICIENT, UNABLE TO PERFORM TEST 98* 142*  ALKPHOS 271* 263* 360*  BILITOT 2.9* 2.5* 3.3*  PROT 5.7* 5.2* 5.0*  ALBUMIN 3.4* 3.0* 2.9*   ABG    Component Value Date/Time   PHART 7.518 (H) 08/07/2007 1442   PCO2ART 33.5 (L) 08/07/2007 1442   PO2ART 70.0 (L) 08/07/2007 1442   HCO3 27.2 (H) 08/07/2007 1442   TCO2 28 08/07/2007 1442   O2SAT 96.0 08/07/2007 1442     Coagulation Profile: Recent Labs  Lab 07/06/2018 2210  INR 1.60    Cardiac Enzymes: Recent Labs  Lab 07/09/2018 2210 07/25/18 0426 07/25/18 0905 07/25/18 1525  TROPONINI 0.06* 0.08* 0.07* 0.08*    HbA1C: Hgb A1c MFr Bld  Date/Time Value Ref Range Status  07/25/2018 04:26 AM 7.6 (H) 4.8 - 5.6 % Final    Comment:     (NOTE) Pre diabetes:          5.7%-6.4% Diabetes:              >6.4% Glycemic control for   <7.0% adults with diabetes     CBG: Recent Labs  Lab 07/29/2018 2250 07/25/18 0812 07/25/18 1229 07/25/18 1631 07/26/18 0008  GLUCAP 117* 143* 145* 103* 127*    Review of Systems:   10 point review of system negative except as noted in HPI  Past Medical History  He,  has a past medical history of Atrial fibrillation ([place]), CAD (coronary artery disease), Chronic systolic heart failure ([place]), Deafness in right ear, Essential hypertension, Hyperlipidemia, Infection involving implantable cardioverter-defibrillator (ICD) ([place]), LBBB (left bundle branch block), Nonischemic cardiomyopathy ([place]), Pneumonia, and Type 2 diabetes mellitus ([place]).   Surgical History    Past Surgical History:  Procedure Laterality Date  . APPENDECTOMY    . CARDIAC DEFIBRILLATOR PLACEMENT  12/14/2007; 11/23/13   St. Jude Promote RF 3207 ICD placed by Dr. Caryl Comes for CHF, nonischemic cardiomyopathy; gen change 11/2013 by Dr Rayann Heman STJ  . CARDIAC DEFIBRILLATOR REMOVAL  06/04/2016  . CATARACT EXTRACTION W/PHACO Right 06/14/2013   Procedure: CATARACT EXTRACTION PHACO AND INTRAOCULAR LENS PLACEMENT ([place]);  Surgeon: Tonny Branch, MD;  Location: [facility];  Service: Ophthalmology;  Laterality: Right;  CDE:  21.74  . CATARACT EXTRACTION W/PHACO Left 07/08/2013   Procedure: CATARACT EXTRACTION PHACO AND INTRAOCULAR LENS PLACEMENT (IOC);  Surgeon: Tonny Branch, MD;  Location: [facility];  Service: Ophthalmology;  Laterality: Left;  CDE:24.66  . CHOLECYSTECTOMY    . Fiscula Repair    . Hearing Surgery    . HERNIA REPAIR    . ICD LEAD REMOVAL N/A 06/04/2016   Procedure: ICD LEAD REMOVAL;  Surgeon: Evans Lance, MD;  Location: [place];  Service: Cardiovascular;  Laterality: N/A;  Owen to back up  . IMPLANTABLE CARDIOVERTER DEFIBRILLATOR GENERATOR CHANGE N/A 11/23/2013   Procedure: IMPLANTABLE CARDIOVERTER DEFIBRILLATOR GENERATOR CHANGE;  Surgeon:  Coralyn Mark, MD;  Location: [facility] CATH LAB;  Service: Cardiovascular;  Laterality: N/A;  . TEE WITHOUT CARDIOVERSION N/A 06/04/2016   Procedure: TRANSESOPHAGEAL ECHOCARDIOGRAM (TEE);  Surgeon: Evans Lance, MD;  Location[facility] OR;  Service: Cardiovascular;  Laterality: N/A;     Social History   reports that he quit smoking about 53 years ago. His smoking use included cigarettes and cigars. He started smoking about 59 years ago. He has a 1.50 pack-year smoking history. He has never used smokeless tobacco. He reports that he does not drink alcohol or use drugs.   Family History   His family history includes Cancer in an other family member; Coronary artery disease in an other family member; Diabetes in an other family member.   Allergies Allergies  Allergen Reactions  . Penicillins Hives, Rash and Other (See Comments)    Has patient had a PCN reaction causing immediate rash, facial/tongue/throat swelling, SOB or lightheadedness with hypotension: Yes Has patient had a PCN reaction causing severe rash involving mucus membranes or skin necrosis: No Has patient had a PCN reaction that required hospitalization No Has patient had a PCN reaction occurring within the last 10 years: No If all of the above answers are "NO", then may proceed with Cephalosporin use.      Home Medications  Prior to Admission medications   Medication Sig Start Date End Date Taking? Authorizing Provider  carvedilol (COREG) 25 MG tablet TAKE 1 TABLET TWICE DAILY  WITH  MEALS Patient not taking: Reported on 07/12/2018 07/24/17   Satira Sark, MD  digoxin (LANOXIN) 0.125 MG tablet TAKE 1 TABLET EVERY DAY Patient not taking: Reported on 07/28/2018 07/24/17   Satira Sark, MD  furosemide (LASIX) 40 MG tablet TAKE 1/2 TABLET TWICE DAILY Patient not taking: Reported on 07/29/2018 07/24/17   Satira Sark, MD  lisinopril (PRINIVIL,ZESTRIL) 10 MG tablet TAKE 1 TABLET EVERY DAY Patient not taking: Reported on  07/05/2018 07/24/17   Satira Sark, MD  simvastatin (ZOCOR) 40 MG tablet TAKE 1 TABLET AT BEDTIME Patient not taking: Reported on 07/13/2018 07/24/17   Satira Sark, MD     Critical care time: The patient is critically ill with multiple organ systems failure and requires high complexity decision making for assessment and support, frequent evaluation and titration of therapies, application of advanced monitoring technologies and extensive interpretation of multiple databases.   Critical Care Time devoted to patient care services described in this note is  45 Minutes. This time reflects time of care of this signee. This critical care time does not reflect procedure time, or teaching time or  supervisory time of PA/NP/Med student/Med Resident etc but could involve care discussion time.  Jacques Earthly, M.D. [facility] Pulmonary/Critical Care Medicine After hours pager: [phone].

## 2018-07-26 NOTE — Progress Notes (Addendum)
Progress Note  Patient Name: [name] Date of Encounter: 07/26/2018  Primary Cardiologist: Dr Diona BrownerMcDowell  Subjective   Complains of dyspnea; no chest pain  Inpatient Medications    Scheduled Meds: . aspirin  81 mg Oral Daily  . atorvastatin  40 mg Oral q1800  . heparin  5,000 Units Subcutaneous Q8H  . insulin aspart  1-3 Units Subcutaneous Q4H  . THROMBI-PAD  1 each Topical Once   Continuous Infusions: . amiodarone 29.88 mg/hr (07/26/18 0800)  . norepinephrine (LEVOPHED) Adult infusion 2 mcg/min (07/26/18 0800)   PRN Meds: acetaminophen **OR** acetaminophen   Vital Signs    Vitals:   07/26/18 0715 07/26/18 0720 07/26/18 0725 07/26/18 0730  BP: (!) 85/61 (!) 81/64  93/67  Pulse: 97 97 97 98  Resp: 16 16 15  (!) 24  Temp:      TempSrc:      SpO2: 99% 98% 98% 99%  Weight:      Height:        Intake/Output Summary (Last 24 hours) at 07/26/2018 0831 Last data filed at 07/26/2018 0800 Gross per 24 hour  Intake 2243.3 ml  Output 500 ml  Net 1743.3 ml   Filed Weights   05/14/18 2047 07/26/18 0650  Weight: 79.4 kg 90.9 kg    Telemetry    Sinus vs ectopic atrial tachycardia- Personally Reviewed   Physical Exam   GEN: No acute distress.  Chronically ill appearing Neck: supple Cardiac: RRR, 2/6 systolic murmur Respiratory: Diminished BS RLL GI: Soft, NT MS: 2+ edema Neuro:  Moves all ext   Labs    Chemistry Recent Labs  Lab 05/14/18 2210 07/25/18 0426 07/26/18 0333  NA 134* 135 133*  K 5.0 4.1 4.3  CL 101 101 99  CO2 13* 19* 19*  GLUCOSE 160* 154* 133*  BUN 64* 61* 58*  CREATININE 2.29* 2.28* 2.51*  CALCIUM 8.7* 8.4* 8.4*  PROT 5.7* 5.2* 5.0*  ALBUMIN 3.4* 3.0* 2.9*  AST 44* 33 107*  ALT QUANTITY NOT SUFFICIENT, UNABLE TO PERFORM TEST 98* 142*  ALKPHOS 271* 263* 360*  BILITOT 2.9* 2.5* 3.3*  GFRNONAA 27* 27* 24*  GFRAA 31* 32* 28*  ANIONGAP 20* 15 15     Hematology Recent Labs  Lab 05/14/18 2210 07/25/18 0426  07/26/18 0333  WBC 9.7 8.5 9.6  RBC 4.99 4.64 4.89  HGB 15.5 14.7 15.3  HCT 48.0 43.8 46.2  MCV 96.2 94.4 94.5  MCH 31.1 31.7 31.3  MCHC 32.3 33.6 33.1  RDW 15.2 15.2 15.1  PLT 141* 145* 150    Cardiac Enzymes Recent Labs  Lab 05/14/18 2210 07/25/18 0426 07/25/18 0905 07/25/18 1525  TROPONINI 0.06* 0.08* 0.07* 0.08*    BNP Recent Labs  Lab 07/25/18 0426  BNP 1,351.7*       Radiology    Ct Abdomen Pelvis Wo Contrast  Result Date: 07/25/2018 CLINICAL DATA:  Hypotensive episode, near syncopal episode, nausea, vomiting, elevated LFTs, history atrial fibrillation, coronary artery disease, non ischemic cardiomyopathy, type II diabetes mellitus, arrhythmia., chronic systolic heart failure EXAM: CT ABDOMEN AND PELVIS WITHOUT CONTRAST TECHNIQUE: Multidetector CT imaging of the abdomen and pelvis was performed following the standard protocol without IV contrast. Sagittal and coronal MPR images reconstructed from axial data set. COMPARISON:  None FINDINGS: Lower chest: Bibasilar pleural effusions and compressive atelectasis greater on RIGHT. Enlargement of heart. Coronary arterial calcifications. Minimal pericardial effusion. Hepatobiliary: Gallbladder surgically absent. Liver small, unremarkable. Pancreas: Atrophic, unremarkable Spleen: Normal appearance Adrenals/Urinary Tract: Adrenal  glands normal appearance. No gross renal mass or hydronephrosis. Bladder unremarkable. Stomach/Bowel: Appendix surgically absent by history. Extension of a non obstructed segment of sigmoid colon into a LEFT inguinal hernia. Sigmoid diverticulosis. Long segment of questionable wall thickening versus artifact from underdistention at sigmoid colon, without adjacent focal inflammatory changes, can not exclude segment of colitis. Remaining bowel loops unremarkable. Stomach decompressed. Vascular/Lymphatic: Atherosclerotic calcifications aorta, iliac arteries, visceral branch vessels, coronary arteries. No  definite adenopathy. Reproductive: Prostatic enlargement, gland measuring 5.8 x 5.5 by question 4.1 cm. Other: Diffuse soft tissue edema within body wall and within abdominal and retroperitoneal tissue planes. Small volume ascites. No free air. No hernia. Musculoskeletal: Unremarkable IMPRESSION: Bibasilar effusions and atelectasis greater on RIGHT. Diffuse soft tissue edema. Small volume ascites. Nonobstructed segment of sigmoid colon within a LEFT inguinal hernia. Sigmoid diverticulosis with questionable wall thickening versus artifact at sigmoid colon cannot exclude segment of sigmoid colitis. Prostatic enlargement. Electronically Signed   By: Ulyses Southward M.D.   On: 07/25/2018 13:54   Dg Chest 2 View  Result Date: 2018/07/28 CLINICAL DATA:  Fall EXAM: CHEST - 2 VIEW COMPARISON:  06/05/2016 FINDINGS: Small right-sided pleural effusion. Hazy atelectasis or infiltrate at the right base. Cardiomegaly with vascular congestion. Aortic atherosclerosis. No pneumothorax. IMPRESSION: 1. Small right-sided pleural effusion with atelectasis or infiltrate at the right base 2. Cardiomegaly with mild central vascular congestion Electronically Signed   By: Jasmine Pang M.D.   On: 07/28/18 22:21   Dg Lumbar Spine Complete  Result Date: July 28, 2018 CLINICAL DATA:  Fall with low back pain EXAM: LUMBAR SPINE - COMPLETE 4+ VIEW COMPARISON:  None. FINDINGS: Lumbar alignment is within normal limits. Vertebral body heights are maintained. Aortic atherosclerosis. Mild degenerative change L1-L2, L2-L3. Mild degenerative change L5-S1. Posterior facet degenerative change L4 through S1. IMPRESSION: 1. No acute osseous abnormality. 2. Multiple level degenerative changes Electronically Signed   By: Jasmine Pang M.D.   On: 07/28/18 22:23   Ct Head Wo Contrast  Result Date: 07/25/2018 CLINICAL DATA:  Hypotension. Near syncope. EXAM: CT HEAD WITHOUT CONTRAST TECHNIQUE: Contiguous axial images were obtained from the base of the  skull through the vertex without intravenous contrast. COMPARISON:  04/27/2009 FINDINGS: Brain: The brain does not show accelerated atrophy. There are mild chronic small-vessel changes of the white matter. No sign of acute infarction, mass lesion, hemorrhage, hydrocephalus or extra-axial collection. Vascular: There is atherosclerotic calcification of the major vessels at the base of the brain. Skull: Negative Sinuses/Orbits: Previous functional endoscopic sinus surgery. There are some cysts and/or polyps in the sinuses presently. Other: None IMPRESSION: 1. No acute finding by CT. Mild chronic small-vessel change of the white matter. 2. Previous functional endoscopic sinus surgery. Some cysts and/or polyps in the sinuses presently. Electronically Signed   By: Paulina Fusi M.D.   On: 07/25/2018 13:45   Dg Chest Port 1 View  Result Date: 07/26/2018 CLINICAL DATA:  Central line infiltration, initial encounter. Pt was able to communicate and assist in movement EXAM: PORTABLE CHEST 1 VIEW COMPARISON:  Radiograph 07-28-2018 FINDINGS: New placement of a RIGHT central venous line tip in the distal SVC. No pneumothorax. Moderate RIGHT effusion. Effusion is increased from 2 days prior. There is a soft tissue / air interface across in the upper spine shadow which is felt to be external patient. LEFT lung is clear. IMPRESSION: 1. Central venous line on the RIGHT with no pneumothorax. 2. Increasing RIGHT effusion. 3. Densely crossing the spine over the upper mediastinum is favored  external to patient. Potentially skin fold. Electronically Signed   By: Genevive Bi M.D.   On: 07/26/2018 07:25   Dg Shoulder Left  Result Date: 08-16-2018 CLINICAL DATA:  Fall with shoulder pain EXAM: LEFT SHOULDER - 2+ VIEW COMPARISON:  None. FINDINGS: There is no evidence of fracture or dislocation. There is no evidence of arthropathy or other focal bone abnormality. Soft tissues are unremarkable. IMPRESSION: Negative. Electronically  Signed   By: Jasmine Pang M.D.   On: 08-16-18 22:23    Patient Profile     72 y.o. male with past medical history of nonischemic cardiomyopathy (last echocardiogram January 2018 with ejection fraction 40 to 45%), biventricular ICD previously removed for infection, paroxysmal atrial fibrillation, noncompliance admitted with failure to thrive, hypotension and worsening lower extremity edema.  Note patient has not been seen by a physician in 2 years because he refuses.  He also does not take any medications at home.  Assessment & Plan    1 hypotension/shock-etiology unclear.  Patient does not appear to be septic.  Possible cardiogenic.  However CVP 7 and coox 66.  Await echocardiogram.  Continue pressors for blood pressure support.  2 atrial fibrillation/flutter-patient in atrial flutter at time of admission.  Rhythm unclear this morning.  Will check electrocardiogram.  Continue IV amiodarone as blood pressure will not allow calcium blocker or beta-blocker.  Patient is not compliant with medications and therefore not a candidate for anticoagulation. Chadsvasc 4.  3 acute on chronic combined systolic/diastolic congestive heart failure-he appears to be volume overloaded on examination.  If ejection fraction decreased may need course of milrinone.  Blood pressure will not allow any other cardiac medications at this time.  4 acute renal failure-follow renal function closely.  5 noncompliance-discussed with patient's son.  He refuses all medications at home.  He refuses doctor visits.  His long-term prognosis will be poor regardless of what started in the hospital if this continues.  For questions or updates, please contact [facility] Please consult [email or web address] for contact info under    Addendum-rhythm appears to be ectopic atrial tach.    Signed, Olga Millers, MD  07/26/2018, 8:31 AM

## 2018-07-26 NOTE — Consult Note (Addendum)
NAME:  [name], MRN:  [id number], DOB:  05-22-1946, LOS: 1 ADMISSION DATE:  07/15/2018, CONSULTATION DATE:  07/25/2018 REFERRING MD:  [facility] hospitalist, CHIEF COMPLAINT:  hypotension  History of present illness   72 year old man with nonischemic cardiomyopathy, HFrEF (EF 40-45% on 09/04/2016), ICD removed in 2017 2/2 infection, paroxysmal atrial fibrillation not on anticoag, HTN, DM2 admitted on 07/25/2018 with N/V x 1 week and falls x 2. Was found to be hypotensive which responded to IV fluids. On morning of 12/22 was found to be hypotensive again and had received 3L of IV fluids and 12.5g albumin IV. Patient was transferred to ICU and started on levo gtt.   Past Medical History  Nonischemic cardiomyopathy ICD removed in 2018 secondary infection Diabetes mellitus Paroxysmal atrial fibrillation Coronary artery disease  Significant Hospital Events   12/20 admission 12/22 transfer to ICU  Consults:  Cardiology  Procedures:  12/22 RIJ CVC  Significant Diagnostic Tests:  CT abd/pelvis w/o contrast on 12/21: Bibasilar effusions and atelectasis greater on RIGHT. Diffuse soft tissue edema. Small volume ascites. Nonobstructed segment of sigmoid colon within a LEFT inguinal Hernia. Sigmoid diverticulosis with questionable wall thickening. Prostatic enlargement.  CT head w/o contrast on 12/21: no acute abnormalities, mild chronic small vessel changes  CXR on 12/20: Small right-sided pleural effusion with atelectasis or infiltrate at the right base, cardiomegaly with mild central vascular congestion  Micro Data:  None  Antimicrobials:  None  Interim history/subjective:  Reports lower back pain and some dyspnea. Also reports feeling drowsy. No new complaints otherwise.  Objective   Blood pressure (!) 77/65, pulse 96, temperature (!) 97.4 F (36.3 C), temperature source Rectal, resp. rate (!) 21, height 6' (1.829 m), weight 79.4 kg, SpO2 95 %.        Intake/Output  Summary (Last 24 hours) at 07/26/2018 0646 Last data filed at 07/26/2018 0426 Gross per 24 hour  Intake 2632.8 ml  Output 500 ml  Net 2132.8 ml   Filed Weights   07/29/2018 2047  Weight: 79.4 kg   Examination: General: Mild distress, lying on side in bed HENT: Normocephalic, no scleral icterus Lungs: Clear to auscultation, no wheezes noted Cardiovascular: Regular rate and rhythm Abdomen: Soft, mild diffuse tenderness, nondistended Extremities: 3+ pitting edema to thighs bilaterally Neuro: Lethargic but follows commands. Oriented x 3. Moves all extremities  Assessment & Plan:  72 year old man with nonischemic cardiomyopathy, HFrEF (EF 40-45% on 09/04/2016), ICD removed in 2017 2/2 infection, paroxysmal atrial fibrillation not on anticoag, HTN, DM2 admitted on 12/20 with N/V x 1 week and falls x 2, found to be hypotensive now unresponsive to IV fluids. Transferred to ICU for pressor support.  Shock, possible cardiogenic, NICM, HFrEF: Unlikely to be septic given no leukocytosis and afebrile. Procal 0.21. UA neg. Hgb normal. Lactic acid yesterday 2.8>2.6. Possible component of hypovolemia but not responding to IV fluids now. TSH normal. Cortisol normal on 12/21. Troponin plateau at 0.07-0.08 on 12/21. BNP 1351.7.  -Levo gtt titrate for goal MAP > 65 -Cardiology following -Obtain Co-ox -Follow up TTE -Home carvedilol, digoxin, Lasix, lisinopril held  AKI, Metabolic acidosis with mildly increased anion gap, mild hyponatremia: Creatinine 2.5, bicarb 19 and anion gap 15. Na 133. CT abd/pelvis w/o hydronephrosis. Probably pre-renal or ATN. Probably related to above. -Continue to follow -Holding lisinopril  Transaminitis: Alk phos 360, total bilirubin 3.3, AST 107 and ALT 142 on 12/22. Possible congestive hepatopathy.  -Follow up acute hepatitis panel  -Obtain ultrasound to evaluate for biliary obstruction  Atrial fibrillation: rate controlled -Continue amio gtt  DM: A1c 7.6 -SSI  Best  practice:  Diet: NPO Pain/Anxiety/Delirium protocol (if indicated): Delirium precautions VAP protocol (if indicated): Not indicated DVT prophylaxis: subq hep GI prophylaxis: not indicated Glucose control: SSI Mobility: OOB Code Status: FULL Family Communication: None at bedside Disposition: ICU  Labs   CBC: Recent Labs  Lab 07/08/2018 2210 07/25/18 0426 07/26/18 0333  WBC 9.7 8.5 9.6  NEUTROABS 8.0* 6.7  --   HGB 15.5 14.7 15.3  HCT 48.0 43.8 46.2  MCV 96.2 94.4 94.5  PLT 141* 145* 209    Basic Metabolic Panel: Recent Labs  Lab 07/23/2018 2210 07/25/18 0426 07/26/18 0333  NA 134* 135 133*  K 5.0 4.1 4.3  CL 101 101 99  CO2 13* 19* 19*  GLUCOSE 160* 154* 133*  BUN 64* 61* 58*  CREATININE 2.29* 2.28* 2.51*  CALCIUM 8.7* 8.4* 8.4*  MG  --   --  2.5*   GFR: Estimated Creatinine Clearance: 28.3 mL/min (A) (by C-G formula based on SCr of 2.51 mg/dL (H)). Recent Labs  Lab 08/04/2018 2210 07/25/18 0426 07/25/18 0434 07/25/18 0659 07/26/18 0333  PROCALCITON  --   --  0.21  --   --   WBC 9.7 8.5  --   --  9.6  LATICACIDVEN  --  2.8*  --  2.6*  --     Liver Function Tests: Recent Labs  Lab 07/22/2018 2210 07/25/18 0426 07/26/18 0333  AST 44* 33 107*  ALT QUANTITY NOT SUFFICIENT, UNABLE TO PERFORM TEST 98* 142*  ALKPHOS 271* 263* 360*  BILITOT 2.9* 2.5* 3.3*  PROT 5.7* 5.2* 5.0*  ALBUMIN 3.4* 3.0* 2.9*   ABG    Component Value Date/Time   PHART 7.518 (H) 08/07/2007 1442   PCO2ART 33.5 (L) 08/07/2007 1442   PO2ART 70.0 (L) 08/07/2007 1442   HCO3 27.2 (H) 08/07/2007 1442   TCO2 28 08/07/2007 1442   O2SAT 66.6 07/26/2018 0610     Coagulation Profile: Recent Labs  Lab 08/04/2018 2210  INR 1.60    Cardiac Enzymes: Recent Labs  Lab 07/13/2018 2210 07/25/18 0426 07/25/18 0905 07/25/18 1525  TROPONINI 0.06* 0.08* 0.07* 0.08*    HbA1C: Hgb A1c MFr Bld  Date/Time Value Ref Range Status  07/25/2018 04:26 AM 7.6 (H) 4.8 - 5.6 % Final    Comment:     (NOTE) Pre diabetes:          5.7%-6.4% Diabetes:              >6.4% Glycemic control for   <7.0% adults with diabetes     CBG: Recent Labs  Lab 07/29/2018 2250 07/25/18 0812 07/25/18 1229 07/25/18 1631 07/26/18 0008  GLUCAP 117* 143* 145* 103* 127*    Review of Systems:   10 point review of system negative except as noted in HPI  Past Medical History  He,  has a past medical history of Atrial fibrillation ([place]), CAD (coronary artery disease), Chronic systolic heart failure ([place]), Deafness in right ear, Essential hypertension, Hyperlipidemia, Infection involving implantable cardioverter-defibrillator (ICD) ([place]), LBBB (left bundle branch block), Nonischemic cardiomyopathy ([place]), Pneumonia, and Type 2 diabetes mellitus ([place]).   Surgical History    Past Surgical History:  Procedure Laterality Date  . APPENDECTOMY    . CARDIAC DEFIBRILLATOR PLACEMENT  12/14/2007; 11/23/13   [employer] Jude Promote RF 3207 ICD placed by Dr. Caryl Comes for CHF, nonischemic cardiomyopathy; gen change 11/2013 by Dr Rayann Heman STJ  . CARDIAC DEFIBRILLATOR REMOVAL  06/04/2016  . CATARACT EXTRACTION W/PHACO Right 06/14/2013   Procedure: CATARACT EXTRACTION PHACO AND INTRAOCULAR LENS PLACEMENT (Power);  Surgeon: Tonny Branch, MD;  Location: [facility];  Service: Ophthalmology;  Laterality: Right;  CDE:  21.74  . CATARACT EXTRACTION W/PHACO Left 07/08/2013   Procedure: CATARACT EXTRACTION PHACO AND INTRAOCULAR LENS PLACEMENT (IOC);  Surgeon: Tonny Branch, MD;  Location: [facility];  Service: Ophthalmology;  Laterality: Left;  CDE:24.66  . CHOLECYSTECTOMY    . Fiscula Repair    . Hearing Surgery    . HERNIA REPAIR    . ICD LEAD REMOVAL N/A 06/04/2016   Procedure: ICD LEAD REMOVAL;  Surgeon: Evans Lance, MD;  Location: [place];  Service: Cardiovascular;  Laterality: N/A;  [place] to back up  . IMPLANTABLE CARDIOVERTER DEFIBRILLATOR GENERATOR CHANGE N/A 11/23/2013   Procedure: IMPLANTABLE CARDIOVERTER DEFIBRILLATOR GENERATOR CHANGE;  Surgeon:  Coralyn Mark, MD;  Location: [facility] CATH LAB;  Service: Cardiovascular;  Laterality: N/A;  . TEE WITHOUT CARDIOVERSION N/A 06/04/2016   Procedure: TRANSESOPHAGEAL ECHOCARDIOGRAM (TEE);  Surgeon: Evans Lance, MD;  Location: [facility] OR;  Service: Cardiovascular;  Laterality: N/A;     Social History   reports that he quit smoking about 53 years ago. His smoking use included cigarettes and cigars. He started smoking about 59 years ago. He has a 1.50 pack-year smoking history. He has never used smokeless tobacco. He reports that he does not drink alcohol or use drugs.   Family History   His family history includes Cancer in an other family member; Coronary artery disease in an other family member; Diabetes in an other family member.   Allergies Allergies  Allergen Reactions  . Penicillins Hives, Rash and Other (See Comments)    Has patient had a PCN reaction causing immediate rash, facial/tongue/throat swelling, SOB or lightheadedness with hypotension: Yes Has patient had a PCN reaction causing severe rash involving mucus membranes or skin necrosis: No Has patient had a PCN reaction that required hospitalization No Has patient had a PCN reaction occurring within the last 10 years: No If all of the above answers are "NO", then may proceed with Cephalosporin use.      Home Medications  Prior to Admission medications   Medication Sig Start Date End Date Taking? Authorizing Provider  carvedilol (COREG) 25 MG tablet TAKE 1 TABLET TWICE DAILY  WITH  MEALS Patient not taking: Reported on 07/10/2018 07/24/17   Satira Sark, MD  digoxin (LANOXIN) 0.125 MG tablet TAKE 1 TABLET EVERY DAY Patient not taking: Reported on 08/04/2018 07/24/17   Satira Sark, MD  furosemide (LASIX) 40 MG tablet TAKE 1/2 TABLET TWICE DAILY Patient not taking: Reported on 07/26/2018 07/24/17   Satira Sark, MD  lisinopril (PRINIVIL,ZESTRIL) 10 MG tablet TAKE 1 TABLET EVERY DAY Patient not taking: Reported on  07/19/2018 07/24/17   Satira Sark, MD  simvastatin (ZOCOR) 40 MG tablet TAKE 1 TABLET AT BEDTIME Patient not taking: Reported on 07/27/2018 07/24/17   Satira Sark, MD     Critical care time: The patient is critically ill with multiple organ systems failure and requires high complexity decision making for assessment and support, frequent evaluation and titration of therapies, application of advanced monitoring technologies and extensive interpretation of multiple databases.   Critical Care Time devoted to patient care services described in this note is  45 Minutes. This time reflects time of care of this signee. This critical care time does not reflect procedure time, or teaching time or  supervisory time of PA/NP/Med student/Med Resident etc but could involve care discussion time.  Jacques Earthly, M.D. [facility] Pulmonary/Critical Care Medicine After hours pager: [phone].

## 2018-07-26 NOTE — Progress Notes (Signed)
Pt BP continues to be low regardless bolus and albumin. Rapid Response nurse consulted, NP on call aware of pt condition. Pt remains asymptomatic, still aler and oriented x 4, denied SOB, dizziness and chest pain. Per NP orders pt transferred to ICU, report given to ICU RN at the bedside.  Pt wife notified of pt being transferred to ICU and wife provided with unit and room number.

## 2018-07-26 NOTE — Progress Notes (Signed)
PT Cancellation Note  Patient Details Name: [name] MRN: [id number] DOB: 1946-08-03   Cancelled Treatment:    Reason Eval/Treat Not Completed: Patient declined, no reason specified. Pt refusing therapy evaluation despite encouragement due to fatigue.   Laurina Bustle, PT, DPT Acute Rehabilitation Services Pager [phone] Office [phone]    Vanetta Mulders 07/26/2018, 11:14 AM

## 2018-07-26 NOTE — Progress Notes (Signed)
CRITICAL VALUE ALERT  Critical Value: Lactic 2.4  Date & Time Notied:  0938  Provider Notified: Dr. Hyacinth Meeker   Orders Received/Actions taken: Will continue to monitor

## 2018-07-26 NOTE — Procedures (Signed)
Central Venous Catheter Insertion Procedure Note [name] [id number] [birth year]  Procedure: Insertion of Central Venous Catheter Indications: vasoactive infusion and assessment of intravascular volume  Procedure Details Consent: Unable to obtain consent because of emergent medical necessity. Time Out: Verified patient identification, verified procedure, site/side was marked, verified correct patient position, special equipment/implants available, medications/allergies/relevent history reviewed, required imaging and test results available.  Performed  Maximum sterile technique was used including antiseptics, cap, gloves, gown, hand hygiene, mask and sheet. Skin prep: Chlorhexidine; local anesthetic administered A antimicrobial bonded/coated triple lumen catheter was placed in the right internal jugular vein using the Seldinger technique.  Evaluation Blood flow good Complications: No apparent complications Patient did tolerate procedure well. Chest X-ray ordered to verify placement.  CXR: pending.  [name] 07/26/2018, 6:41 AM

## 2018-07-26 NOTE — Progress Notes (Signed)
eLink Physician-Brief Progress Note Patient Name: [name] DOB: [birth year] MRN: [id number]   Date of Service  07/26/2018  HPI/Events of Note  Oliguria - Bladder scan reveals only 190 mL of urine. Patient demands I/O cath.  eICU Interventions  Will order: 1. I/O Cath PRN.      Intervention Category Intermediate Interventions: Oliguria - evaluation and management  Hermann Dottavio Eugene 07/26/2018, 8:51 PM

## 2018-07-26 NOTE — Progress Notes (Signed)
Pt c/o discomfort and pressure related to Foley catheter. Explained to pt that little discomfort is normal. As advised by NP, Tylenol offered to pt and  Suggested to see if improvement will be noted. Pt refused, and requested Foley catheter to be removed. He was also informed that RN will continue to do in and out catheterization as needed. Pt agreeable.  Foley catheter removed without complication, no bleeding or discharge from site upon removal. Will continue to monitor and treat pt.

## 2018-07-26 NOTE — Significant Event (Addendum)
Rapid Response Event Note  Overview: Hypotension  Initial Focused Assessment: Received a call from bedside RN about patient's SBP being in the 70-80s. RN had paged [facility] NP and received orders for 500cc NS bolus. Per RN 250cc had be administered and 250cc were left. Patient is still neuro intact, follow commands, and is asymptomatic. I asked the RN to finish the bolus and recheck the VS. SBP did not improved after 500 cc. When I arrived, patient was pale in color and cool to touch, HR 90s AF, and SBP 70-80s, MAPs around 60s. Earlier in the shift, patient was started on an Amiodarone infusion, currently running at 30mg /hr. + #3 edema on all extremities.   I paged [facility] NP and updated him on patient's condition.   Interventions: - Albumin 12.5 g x 1 - If BP does not improved, NP will call PCCM. - BP did not improve, PCCM was called, I was called away to medical emergency, I coordinated with ICU staff and patient was transferred to CVICU, central line was placed, plan to start Levophed.   Plan of Care:  - [facility] NP calling CCM at [phone]   Event Summary:.  Call Time 0214 Arrival Time 0325 End Time 0635  [name], [name]

## 2018-07-26 NOTE — Progress Notes (Signed)
Pt unable to urinate, there is active order to bladder scan pt every 4 hr and perform in and out if greater than 200cc.  In and out catheterization completed x 2 since admission. NP on call consulted and order for Foley catheter placed. Foley placed at the bedside 5 west 02 by Christean Grief, RN for acute urinary retention. Morrie Sheldon, I NT 2 present. Perineal care completed, pt educated, and Foley placed w/o difficulties maintaining sterile technique. Catheter secured, and 200 ml of clear dark yellow drained from bladder. Foley care performed.  Pt verbalized some discomfort, and pressure.  Will continue to monitor pt.

## 2018-07-26 NOTE — Progress Notes (Signed)
Pt BP 73/56, HR 99, RR ranges from 18-27, SpO2 96 on 2L nasal cannula, pt remains alert and oriented x 4, no signs of acute respiratory distress. NP on call notified. Bolus of NS ordered at rate of 277ml/hr. Pt lungs clear on auscultation.  Will continue to monitor and treat pt per MD orders.

## 2018-07-27 LAB — PROCALCITONIN: Procalcitonin: 0.55 ng/mL

## 2018-07-27 LAB — BASIC METABOLIC PANEL
Anion gap: 14 (ref 5–15)
Anion gap: 6 (ref 5–15)
BUN: 62 mg/dL — ABNORMAL HIGH (ref 8–23)
BUN: 40 mg/dL — ABNORMAL HIGH (ref 8–23)
CALCIUM: 4 mg/dL — AB (ref 8.9–10.3)
CHLORIDE: 100 mmol/L (ref 98–111)
CO2: 18 mmol/L — ABNORMAL LOW (ref 22–32)
CO2: 12 mmol/L — AB (ref 22–32)
Calcium: 7.8 mg/dL — ABNORMAL LOW (ref 8.9–10.3)
Chloride: 123 mmol/L — ABNORMAL HIGH (ref 98–111)
Creatinine, Ser: 2.79 mg/dL — ABNORMAL HIGH (ref 0.61–1.24)
Creatinine, Ser: 1.38 mg/dL — ABNORMAL HIGH (ref 0.61–1.24)
GFR calc Af Amer: 25 mL/min — ABNORMAL LOW (ref >60)
GFR calc Af Amer: 58 mL/min — ABNORMAL LOW (ref >60)
GFR calc non Af Amer: 21 mL/min — ABNORMAL LOW (ref >60)
GFR calc non Af Amer: 50 mL/min — ABNORMAL LOW (ref >60)
Glucose, Bld: 157 mg/dL — ABNORMAL HIGH (ref 70–99)
Glucose, Bld: 113 mg/dL — ABNORMAL HIGH (ref 70–99)
Potassium: 4.2 mmol/L (ref 3.5–5.1)
Potassium: 2 mmol/L — CL (ref 3.5–5.1)
Sodium: 132 mmol/L — ABNORMAL LOW (ref 135–145)
Sodium: 141 mmol/L (ref 135–145)

## 2018-07-27 LAB — COMPREHENSIVE METABOLIC PANEL
ALBUMIN: 3 g/dL — AB (ref 3.5–5.0)
ALT: 241 U/L — ABNORMAL HIGH (ref 0–44)
AST: 188 U/L — AB (ref 15–41)
Alkaline Phosphatase: 391 U/L — ABNORMAL HIGH (ref 38–126)
Anion gap: 13 (ref 5–15)
BILIRUBIN TOTAL: 4.2 mg/dL — AB (ref 0.3–1.2)
BUN: 64 mg/dL — AB (ref 8–23)
CO2: 20 mmol/L — ABNORMAL LOW (ref 22–32)
Calcium: 8.3 mg/dL — ABNORMAL LOW (ref 8.9–10.3)
Chloride: 96 mmol/L — ABNORMAL LOW (ref 98–111)
Creatinine, Ser: 2.96 mg/dL — ABNORMAL HIGH (ref 0.61–1.24)
GFR calc Af Amer: 23 mL/min — ABNORMAL LOW (ref >60)
GFR, EST NON AFRICAN AMERICAN: 20 mL/min — AB (ref >60)
GLUCOSE: 152 mg/dL — AB (ref 70–99)
Potassium: 4.3 mmol/L (ref 3.5–5.1)
Sodium: 129 mmol/L — ABNORMAL LOW (ref 135–145)
Total Protein: 4.7 g/dL — ABNORMAL LOW (ref 6.5–8.1)

## 2018-07-27 LAB — LACTIC ACID, PLASMA
LACTIC ACID, VENOUS: 2.7 mmol/L — AB (ref 0.5–1.9)
Lactic Acid, Venous: 3.7 mmol/L (ref 0.5–1.9)

## 2018-07-27 LAB — PHOSPHORUS: Phosphorus: 4.7 mg/dL — ABNORMAL HIGH (ref 2.5–4.6)

## 2018-07-27 LAB — CBC
HCT: 46.1 % (ref 39.0–52.0)
Hemoglobin: 15.1 g/dL (ref 13.0–17.0)
MCH: 30.9 pg (ref 26.0–34.0)
MCHC: 32.8 g/dL (ref 30.0–36.0)
MCV: 94.5 fL (ref 80.0–100.0)
Platelets: 143 10*3/uL — ABNORMAL LOW (ref 150–400)
RBC: 4.88 MIL/uL (ref 4.22–5.81)
RDW: 15.1 % (ref 11.5–15.5)
WBC: 11 10*3/uL — ABNORMAL HIGH (ref 4.0–10.5)
nRBC: 0.4 % — ABNORMAL HIGH (ref 0.0–0.2)

## 2018-07-27 LAB — GLUCOSE, CAPILLARY
Glucose-Capillary: 109 mg/dL — ABNORMAL HIGH (ref 70–99)
Glucose-Capillary: 118 mg/dL — ABNORMAL HIGH (ref 70–99)
Glucose-Capillary: 141 mg/dL — ABNORMAL HIGH (ref 70–99)
Glucose-Capillary: 152 mg/dL — ABNORMAL HIGH (ref 70–99)
Glucose-Capillary: 153 mg/dL — ABNORMAL HIGH (ref 70–99)
Glucose-Capillary: 156 mg/dL — ABNORMAL HIGH (ref 70–99)

## 2018-07-27 LAB — MAGNESIUM: Magnesium: 2.7 mg/dL — ABNORMAL HIGH (ref 1.7–2.4)

## 2018-07-27 LAB — PATHOLOGIST SMEAR REVIEW

## 2018-07-27 MED ORDER — LACTATED RINGERS IV BOLUS
500.0000 mL | Freq: Once | INTRAVENOUS | Status: AC
  Administered 2018-07-27: 500 mL via INTRAVENOUS

## 2018-07-27 MED ORDER — FUROSEMIDE 10 MG/ML IJ SOLN
80.0000 mg | Freq: Once | INTRAMUSCULAR | Status: AC
  Administered 2018-07-27: 80 mg via INTRAVENOUS
  Filled 2018-07-27: qty 8

## 2018-07-27 MED ORDER — SODIUM CHLORIDE 0.9 % IV SOLN
INTRAVENOUS | Status: DC | PRN
  Administered 2018-07-27: 20:00:00 via INTRAVENOUS

## 2018-07-27 MED ORDER — HYDROCORTISONE NA SUCCINATE PF 100 MG IJ SOLR
100.0000 mg | Freq: Once | INTRAMUSCULAR | Status: AC
  Administered 2018-07-27: 100 mg via INTRAVENOUS
  Filled 2018-07-27: qty 2

## 2018-07-27 MED ORDER — POTASSIUM CHLORIDE CRYS ER 20 MEQ PO TBCR
40.0000 meq | EXTENDED_RELEASE_TABLET | Freq: Once | ORAL | Status: AC
  Administered 2018-07-27: 40 meq via ORAL
  Filled 2018-07-27: qty 2

## 2018-07-27 MED ORDER — POTASSIUM CHLORIDE 10 MEQ/100ML IV SOLN
10.0000 meq | INTRAVENOUS | Status: AC
  Administered 2018-07-27 – 2018-07-28 (×4): 10 meq via INTRAVENOUS
  Filled 2018-07-27: qty 100

## 2018-07-27 NOTE — Progress Notes (Signed)
eLink Physician-Brief Progress Note Patient Name: [name] DOB: [birth year] MRN: [id number]   Date of Service  07/27/2018  HPI/Events of Note    eICU Interventions  Hypokalemia -repleted      Intervention Category Intermediate Interventions: Electrolyte abnormality - evaluation and management  Alvon Nygaard V. Petrona Wyeth 07/27/2018, 7:55 PM

## 2018-07-27 NOTE — Evaluation (Signed)
Physical Therapy Evaluation Patient Details Name: [name] MRN: [id number] DOB: 07-15-1946 Today's Date: 07/27/2018   History of Present Illness  72 year old man with nonischemic cardiomyopathy, HFrEF (EF 40-45% on 09/04/2016), ICD removed in 2017 2/2 infection, paroxysmal atrial fibrillation not on anticoag, HTN, DM2 admitted on 12/20 with N/V x 1 week and falls x 2, found to have hypertension and shock  Clinical Impression  Orders received for PT evaluation. Patient demonstrates deficits in functional mobility as indicated below. Will benefit from continued skilled PT to address deficits and maximize function. Will see as indicated and progress as tolerated.  At this time, patient requires significant physical assist for basic standing activities. Feel patient is unsafe for d/c home, will need SNF.    Follow Up Recommendations SNF;Supervision for mobility/OOB    Equipment Recommendations  (TBD)    Recommendations for Other Services       Precautions / Restrictions Precautions Precautions: Fall      Mobility  Bed Mobility Overal bed mobility: Needs Assistance Bed Mobility: Rolling;Sidelying to Sit;Sit to Supine Rolling: Mod assist Sidelying to sit: Max assist   Sit to supine: Mod assist;+2 for physical assistance   General bed mobility comments: Increased time and effort to perform, assist for transitional movements to upright and from control and movement of LEs in both positions  Transfers Overall transfer level: Needs assistance Equipment used: 2 person hand held assist Transfers: Sit to/from Stand Sit to Stand: Max assist;+2 physical assistance         General transfer comment: Significant time to reach upright postion with max assist of 2. VCs and tactile cues for posture and positioning  Ambulation/Gait Ambulation/Gait assistance: Max assist;+2 physical assistance Gait Distance (Feet): 2 Feet         General Gait Details: side steps to  [facility]  Stairs            Wheelchair Mobility    Modified Rankin (Stroke Patients Only)       Balance Overall balance assessment: History of Falls                                           Pertinent Vitals/Pain Pain Assessment: Faces Faces Pain Scale: Hurts whole lot Pain Location: buttocks, back and legs Pain Descriptors / Indicators: Grimacing;Guarding;Discomfort;Moaning Pain Intervention(s): Monitored during session;Repositioned;Limited activity within patient's tolerance    Home Living Family/patient expects to be discharged to:: Private residence Living Arrangements: Spouse/significant other;Children Available Help at Discharge: Family Type of Home: House Home Access: Stairs to enter;Ramped entrance     Home Layout: One level Home Equipment: Environmental consultant - 2 wheels;Walker - 4 wheels;Wheelchair - manual Additional Comments: patient questionable historian    Prior Function           Comments: unclear, patient with contradictions to activity levels     Hand Dominance   Dominant Hand: Right    Extremity/Trunk Assessment   Upper Extremity Assessment Upper Extremity Assessment: Generalized weakness    Lower Extremity Assessment Lower Extremity Assessment: Generalized weakness(history of neuropathy)       Communication      Cognition Arousal/Alertness: Awake/alert Behavior During Therapy: Anxious Overall Cognitive Status: Impaired/Different from baseline Area of Impairment: Orientation;Memory;Following commands;Safety/judgement;Awareness;Problem solving                 Orientation Level: Disoriented to;Situation   Memory: Decreased short-term memory Following Commands: Follows one  step commands with increased time Safety/Judgement: Decreased awareness of safety;Decreased awareness of deficits Awareness: Emergent Problem Solving: Slow processing;Requires verbal cues;Requires tactile cues        General Comments       Exercises     Assessment/Plan    PT Assessment Patient needs continued PT services  PT Problem List Decreased strength;Decreased balance;Decreased activity tolerance;Decreased mobility;Decreased cognition;Decreased coordination;Decreased safety awareness;Pain       PT Treatment Interventions DME instruction;Gait training;Functional mobility training;Therapeutic activities;Therapeutic exercise;Balance training;Cognitive remediation;Patient/family education    PT Goals (Current goals can be found in the Care Plan section)  Acute Rehab PT Goals Patient Stated Goal: none stated PT Goal Formulation: With patient Time For Goal Achievement: 08/10/18 Potential to Achieve Goals: Good    Frequency Min 2X/week   Barriers to discharge Decreased caregiver support      Co-evaluation               AM-PAC PT "6 Clicks" Mobility  Outcome Measure Help needed turning from your back to your side while in a flat bed without using bedrails?: A Lot Help needed moving from lying on your back to sitting on the side of a flat bed without using bedrails?: A Lot Help needed moving to and from a bed to a chair (including a wheelchair)?: A Lot Help needed standing up from a chair using your arms (e.g., wheelchair or bedside chair)?: A Lot Help needed to walk in hospital room?: A Lot Help needed climbing 3-5 steps with a railing? : Total 6 Click Score: 11    End of Session Equipment Utilized During Treatment: Oxygen Activity Tolerance: Patient limited by fatigue Patient left: in bed;with call bell/phone within reach Nurse Communication: Mobility status PT Visit Diagnosis: Unsteadiness on feet (R26.81);History of falling (Z91.81);Muscle weakness (generalized) (M62.81);Difficulty in walking, not elsewhere classified (R26.2);Other symptoms and signs involving the nervous system (R29.898);Pain;Repeated falls (R29.6);Other abnormalities of gait and mobility (R26.89)    Time: [id number] PT Time  Calculation (min) (ACUTE ONLY): 19 min   Charges:   PT Evaluation $PT Eval Moderate Complexity: 1 Mod          Charlotte Crumbevon Johnothan Bascomb, PT DPT  Board Certified Neurologic Specialist Acute Rehabilitation Services Pager [phone] Office [phone]   Fabio AsaDevon J Senya Hinzman 07/27/2018, 1:53 PM

## 2018-07-27 NOTE — Progress Notes (Signed)
CRITICAL VALUE ALERT  Critical Value:  Potassium 2.0   Calcium 4.0  Date & Time Notied: 12/23 1854    Provider Notified: Dr. Mickel Crow  Orders Received/Actions taken: Doctor currently in a Code will follow up with orders.

## 2018-07-27 NOTE — Progress Notes (Signed)
   Poor Ur OP Refusing foley Limited bladder volume per RN Poor reposnse to lasix or fluids though creat and lctate some better with fluids  Recent Labs  Lab 07/25/18 0434  07/26/18 0820 07/27/18 1134 07/27/18 1410  LATICACIDVEN  --    < > 2.4* 3.7* 2.7*  PROCALCITON 0.21  --   --  0.55  --    < > = values in this interval not displayed.     Recent Labs  Lab 07/25/18 0426 07/26/18 0333 07/27/18 0345  HGB 14.7 15.3 15.1  HCT 43.8 46.2 46.1  WBC 8.5 9.6 11.0*  PLT 145* 150 143*    Recent Labs  Lab 07/06/2018 2210 07/25/18 0426 07/26/18 0333 07/27/18 0345 07/27/18 1134  CREATININE 2.29* 2.28* 2.51* 2.96* 2.79*   Plan Repeat BMET     SIGNATURE    Dr. Kalman Shan, M.D., F.C.C.P,  Pulmonary and Critical Care Medicine Staff Physician, [facility] Director - Interstitial Lung Disease  Program  Pulmonary Fibrosis [facility] at [facility] [place], [place], [address]  Pager: [phone], If no answer or between  15:00h - 7:00h: call [phone] Telephone: [phone]  6:07 PM 07/27/2018

## 2018-07-27 NOTE — Progress Notes (Signed)
This RN performed bladder scan d/t decreased UOP throughout the day. Scan showed 160-194cc in bladder. Patient initially stated he did not feel any distention or discomfort, but that he wanted his "fluid drained" before he would drink anything or go to sleep. This RN explained to patient the risk for infection associated with in and out cath and that it would not be prudent to cath at this time given the small amount in his bladder. Patient became agitated. Charge RN to bedside and also explained the risks of infection and the appropriate timing for catheterization. Patient now states that his "bladder feels bloated" and that as the patient, he demands that a catheterization be performed. ELink contacted. This RN spoke with Dr. Arsenio Loader who put in order for PRN I&O catheterization.  Sterile I&O catheterization performed and 100cc of clear, yellow urine returned. Patient states he is unsure if bloating sensation is improved. Patient positioned for comfort with call bell in reach. Will continue to monitor closely.

## 2018-07-27 NOTE — Progress Notes (Signed)
Pt. Unable to void. Bladder scanned at 1100 found 320. I&O removed 150. Pt. Received 2 Bolus of LR between 1100 and 1400. Bladder scan at 1600 showed 151 mL remaining in bladder. Ramaswamy MD notified. Ordered stat BMP.

## 2018-07-27 NOTE — Progress Notes (Signed)
CRITICAL VALUE ALERT  Critical Value:Lactated acid 2.7  Date & Time Notied: 12/23 1523  Provider Notified: expected value  Orders Received/Actions taken: Continue to monitor. Expected value.

## 2018-07-27 NOTE — Progress Notes (Signed)
CRITICAL VALUE ALERT  Critical Value:  Lactic Acid 3.7  Date & Time Notied:12/23 1245  Provider Notified: Marchelle Gearing, MD  Orders Received/Actions taken: LR bolus

## 2018-07-27 NOTE — Progress Notes (Signed)
eLink Physician-Brief Progress Note Patient Name: [name] DOB: 06/12/1946 MRN: [id number]   Date of Service  07/27/2018  HPI/Events of Note  Hyponatremia - Na+ = 135 --> 133 --> 129. CVP = 15 and Creatinine = 2.96.   eICU Interventions  Will order: 1. Lasix 80 mg IV now.  2. Repeat BMP at 12 noon.      Intervention Category Major Interventions: Electrolyte abnormality - evaluation and management  [name] 07/27/2018, 5:21 AM

## 2018-07-27 NOTE — Progress Notes (Signed)
Progress Note  Patient Name: [name] Date of Encounter: 07/27/2018  Primary Cardiologist: Diona BrownerMcDowell / Ladona Ridgelaylor   Subjective   72 year old gentleman with a history of dilated cardiomyopathy, AICD, paroxysmal atrial fibrillation who we are asked to see by the hospitalist for further evaluation and management of possible congestive heart failure.  The patient was admitted with hypotension and shock. Echocardiogram is being done today.   Inpatient Medications    Scheduled Meds: . aspirin  81 mg Oral Daily  . atorvastatin  40 mg Oral q1800  . heparin  5,000 Units Subcutaneous Q8H  . insulin aspart  1-3 Units Subcutaneous Q4H  . THROMBI-PAD  1 each Topical Once   Continuous Infusions: . amiodarone 30 mg/hr (07/27/18 0900)  . norepinephrine (LEVOPHED) Adult infusion 4 mcg/min (07/27/18 0900)   PRN Meds: acetaminophen **OR** acetaminophen   Vital Signs    Vitals:   07/27/18 0700 07/27/18 0747 07/27/18 0800 07/27/18 0900  BP: 90/61  (!) 105/92 (!) 94/58  Pulse: 96  97 95  Resp:   18   Temp:  (!) 96.9 F (36.1 C)    TempSrc:  Axillary    SpO2: 99%  99% 97%  Weight:      Height:        Intake/Output Summary (Last 24 hours) at 07/27/2018 0943 Last data filed at 07/27/2018 0900 Gross per 24 hour  Intake 928.83 ml  Output 260 ml  Net 668.83 ml   Filed Weights   07/29/2018 2047 07/26/18 0650 07/27/18 0600  Weight: 79.4 kg 90.9 kg 92.2 kg    Telemetry     likely NSR - Personally Reviewed  ECG       Physical Exam   GEN: Elderly gentleman.  He would not answer questions except for in a yes or no manner.  He was moaning. Neck: No JVD Cardiac: RRR, no murmurs, rubs, or gallops.  Respiratory: Clear to auscultation bilaterally. GI: Soft, nontender, non-distended  MS:  Trace leg edema Neuro:  minimally cooperative  Psych:   Unable to assess.   Labs    Chemistry Recent Labs  Lab 07/25/18 0426 07/26/18 0333 07/27/18 0345  NA 135 133* 129*  K 4.1  4.3 4.3  CL 101 99 96*  CO2 19* 19* 20*  GLUCOSE 154* 133* 152*  BUN 61* 58* 64*  CREATININE 2.28* 2.51* 2.96*  CALCIUM 8.4* 8.4* 8.3*  PROT 5.2* 5.0* 4.7*  ALBUMIN 3.0* 2.9* 3.0*  AST 33 107* 188*  ALT 98* 142* 241*  ALKPHOS 263* 360* 391*  BILITOT 2.5* 3.3* 4.2*  GFRNONAA 27* 24* 20*  GFRAA 32* 28* 23*  ANIONGAP 15 15 13      Hematology Recent Labs  Lab 07/25/18 0426 07/26/18 0333 07/27/18 0345  WBC 8.5 9.6 11.0*  RBC 4.64 4.89 4.88  HGB 14.7 15.3 15.1  HCT 43.8 46.2 46.1  MCV 94.4 94.5 94.5  MCH 31.7 31.3 30.9  MCHC 33.6 33.1 32.8  RDW 15.2 15.1 15.1  PLT 145* 150 143*    Cardiac Enzymes Recent Labs  Lab 07/25/2018 2210 07/25/18 0426 07/25/18 0905 07/25/18 1525  TROPONINI 0.06* 0.08* 0.07* 0.08*   No results for input(s): TROPIPOC in the last 168 hours.   BNP Recent Labs  Lab 07/25/18 0426  BNP 1,351.7*     DDimer No results for input(s): DDIMER in the last 168 hours.   Radiology    Ct Abdomen Pelvis Wo Contrast  Result Date: 07/25/2018 CLINICAL DATA:  Hypotensive episode, near syncopal  episode, nausea, vomiting, elevated LFTs, history atrial fibrillation, coronary artery disease, non ischemic cardiomyopathy, type II diabetes mellitus, arrhythmia., chronic systolic heart failure EXAM: CT ABDOMEN AND PELVIS WITHOUT CONTRAST TECHNIQUE: Multidetector CT imaging of the abdomen and pelvis was performed following the standard protocol without IV contrast. Sagittal and coronal MPR images reconstructed from axial data set. COMPARISON:  None FINDINGS: Lower chest: Bibasilar pleural effusions and compressive atelectasis greater on RIGHT. Enlargement of heart. Coronary arterial calcifications. Minimal pericardial effusion. Hepatobiliary: Gallbladder surgically absent. Liver small, unremarkable. Pancreas: Atrophic, unremarkable Spleen: Normal appearance Adrenals/Urinary Tract: Adrenal glands normal appearance. No gross renal mass or hydronephrosis. Bladder unremarkable.  Stomach/Bowel: Appendix surgically absent by history. Extension of a non obstructed segment of sigmoid colon into a LEFT inguinal hernia. Sigmoid diverticulosis. Long segment of questionable wall thickening versus artifact from underdistention at sigmoid colon, without adjacent focal inflammatory changes, can not exclude segment of colitis. Remaining bowel loops unremarkable. Stomach decompressed. Vascular/Lymphatic: Atherosclerotic calcifications aorta, iliac arteries, visceral branch vessels, coronary arteries. No definite adenopathy. Reproductive: Prostatic enlargement, gland measuring 5.8 x 5.5 by question 4.1 cm. Other: Diffuse soft tissue edema within body wall and within abdominal and retroperitoneal tissue planes. Small volume ascites. No free air. No hernia. Musculoskeletal: Unremarkable IMPRESSION: Bibasilar effusions and atelectasis greater on RIGHT. Diffuse soft tissue edema. Small volume ascites. Nonobstructed segment of sigmoid colon within a LEFT inguinal hernia. Sigmoid diverticulosis with questionable wall thickening versus artifact at sigmoid colon cannot exclude segment of sigmoid colitis. Prostatic enlargement. Electronically Signed   By: Ulyses Southward M.D.   On: 07/25/2018 13:54   Ct Head Wo Contrast  Result Date: 07/25/2018 CLINICAL DATA:  Hypotension. Near syncope. EXAM: CT HEAD WITHOUT CONTRAST TECHNIQUE: Contiguous axial images were obtained from the base of the skull through the vertex without intravenous contrast. COMPARISON:  04/27/2009 FINDINGS: Brain: The brain does not show accelerated atrophy. There are mild chronic small-vessel changes of the white matter. No sign of acute infarction, mass lesion, hemorrhage, hydrocephalus or extra-axial collection. Vascular: There is atherosclerotic calcification of the major vessels at the base of the brain. Skull: Negative Sinuses/Orbits: Previous functional endoscopic sinus surgery. There are some cysts and/or polyps in the sinuses presently.  Other: None IMPRESSION: 1. No acute finding by CT. Mild chronic small-vessel change of the white matter. 2. Previous functional endoscopic sinus surgery. Some cysts and/or polyps in the sinuses presently. Electronically Signed   By: Paulina Fusi M.D.   On: 07/25/2018 13:45   US Abdomen Complete  Result Date: 07/26/2018 CLINICAL DATA:  Elevated LFTs, previous cholecystectomy, diabetes and hypertension EXAM: ABDOMEN ULTRASOUND COMPLETE COMPARISON:  07/25/2018 CT without contrast FINDINGS: Gallbladder: Surgically absent Common bile duct: Diameter: 4.8 mm Liver: No focal lesion identified. Within normal limits in parenchymal echogenicity. Portal vein is patent on color Doppler imaging with normal direction of blood flow towards the liver. IVC: No abnormality visualized. Pancreas: Visualized portion unremarkable. Spleen: 6.6 cm.  Normal in size.  Splenic granulomata noted. Right Kidney: Length: 11.4 cm. Echogenicity within normal limits. No mass or hydronephrosis visualized. Left Kidney: Length: 10.3 cm. Echogenicity within normal limits. No mass or hydronephrosis visualized. Abdominal aorta: Aortic atherosclerosis.  No significant aneurysm. Other findings: Small amount of abdominal ascites. Pleural effusions present bilaterally. IMPRESSION: No acute finding by abdominal ultrasound Remote cholecystectomy Aortic atherosclerosis without aneurysm Small amount of abdominal ascites and bilateral pleural effusions Electronically Signed   By: Judie Petit.  Shick M.D.   On: 07/26/2018 09:27   Dg Chest [facility] [address]  Result Date: 07/26/2018 CLINICAL DATA:  Central line infiltration, initial encounter. Pt was able to communicate and assist in movement EXAM: PORTABLE CHEST 1 VIEW COMPARISON:  Radiograph 07/28/2018 FINDINGS: New placement of a RIGHT central venous line tip in the distal SVC. No pneumothorax. Moderate RIGHT effusion. Effusion is increased from 2 days prior. There is a soft tissue / air interface across in the upper  spine shadow which is felt to be external patient. LEFT lung is clear. IMPRESSION: 1. Central venous line on the RIGHT with no pneumothorax. 2. Increasing RIGHT effusion. 3. Densely crossing the spine over the upper mediastinum is favored external to patient. Potentially skin fold. Electronically Signed   By: Genevive Bi M.D.   On: 07/26/2018 07:25    Cardiac Studies     Patient Profile     72 y.o. male who presents with hypotension and shock.  Echocardiogram to be done today.  Assessment & Plan    1.  Hypotension/shock: We will check an echocardiogram today.  2.  Acute renal insufficiency: The patient has an acute renal failure.  His creatinine in October, 2017 was 0.83.  Today's creatinine is 2.96.  Further plan and management per the critical care team.  3.  Troponin elevation: This is likely due to his hypotension.  No evidence of myocardial infarction at this time.  4.  Medical noncompliance: Patient apparently does not take his medications and does not go to the doctor on a regular basis.  This will make successful treatment of his problems very difficult/impossible.      For questions or updates, please contact [facility] Please consult [email or web address] for contact info under        Signed, Kristeen Miss, MD  07/27/2018, 9:43 AM

## 2018-07-27 NOTE — Progress Notes (Signed)
NAME:  [name], MRN:  [id number], DOB:  Aug 29, 1945, LOS: 2 ADMISSION DATE:  07/16/2018, CONSULTATION DATE:  07/25/2018 REFERRING MD:  [facility] hospitalist, CHIEF COMPLAINT:  hypotension  BRIEF  72 year old man with nonischemic cardiomyopathy, HFrEF (EF 40-45% on 09/04/2016), ICD removed in 2017 2/2 infection, paroxysmal atrial fibrillation not on anticoag, HTN, DM2 admitted on 07/06/2018 with N/V x 1 week and falls x 2. Was found to be hypotensive which responded to IV fluids. On morning of 12/22 was found to be hypotensive again and had received 3L of IV fluids and 12.5g albumin IV. Patient was transferred to ICU and started on levo gtt. Social : Not seen MD in 2 years - refuses.Does not take meds at home  Past Medical History  Nonischemic cardiomyopathy ICD removed in 2018 secondary infection Diabetes mellitus Paroxysmal atrial fibrillation Coronary artery disease  Significant Hospital Events   12/20 admission 12/22 transfer to ICU 12/22 - Reports lower back pain and some dyspnea. Also reports feeling drowsy. No new complaints otherwise.  Consults:  Cardiology  Procedures:  12/22 RIJ CVC  Significant Diagnostic Tests:  CT abd/pelvis w/o contrast on 12/21: Bibasilar effusions and atelectasis greater on RIGHT. Diffuse soft tissue edema. Small volume ascites. Nonobstructed segment of sigmoid colon within a LEFT inguinal Hernia. Sigmoid diverticulosis with questionable wall thickening. Prostatic enlargement.  CT head w/o contrast on 12/21: no acute abnormalities, mild chronic small vessel changes  CXR on 12/20: Small right-sided pleural effusion with atelectasis or infiltrate at the right base, cardiomegaly with mild central vascular congestion  Micro Data:  None  Antimicrobials:  None  Interim history/subjective:   12/23 - Hypoonatremia, Oliguric overnight .On amio gtt and levophed gtt. Lasix given overnight and AKI worse  Objective   Blood pressure (!) 94/58,  pulse 95, temperature (!) 96.9 F (36.1 C), temperature source Axillary, resp. rate 18, height 6' (1.829 m), weight 92.2 kg, SpO2 97 %. CVP:  [8 mmHg-20 mmHg] 15 mmHg      Intake/Output Summary (Last 24 hours) at 07/27/2018 0947 Last data filed at 07/27/2018 0900 Gross per 24 hour  Intake 928.83 ml  Output 260 ml  Net 668.83 ml   Filed Weights   07/29/2018 2047 07/26/18 0650 07/27/18 0600  Weight: 79.4 kg 90.9 kg 92.2 kg    General Appearance:  Looks deconditioned. ANASARCA Head:  Normocephalic, without obvious abnormality, atraumatic Eyes:  PERRL - yes, conjunctiva/corneas - clear     Ears:  Normal external ear canals, both ears Nose:  G tube - no Throat:  ETT TUBE - no , OG tube - no Neck:  Supple,  No enlargement/tenderness/nodules Lungs: Clear to auscultation bilaterally,  Heart:  S1 and S2 normal, no murmur, CVP - no.  Pressors - yes Abdomen:  Soft, no masses, no organomegaly Genitalia / Rectal:  Not done Extremities:  Extremities- anasarca Skin:  ntact in exposed areas .  Neurologic:  Sedation - none -> RASS - -1 . Moves all 4s - yes. CAM-ICU - x . Orientation - x      LABS    PULMONARY Recent Labs  Lab 07/26/18 0610  O2SAT 66.6    CBC Recent Labs  Lab 07/25/18 0426 07/26/18 0333 07/27/18 0345  HGB 14.7 15.3 15.1  HCT 43.8 46.2 46.1  WBC 8.5 9.6 11.0*  PLT 145* 150 143*    COAGULATION Recent Labs  Lab 07/14/2018 2210  INR 1.60    CARDIAC   Recent Labs  Lab 08/03/2018 2210 07/25/18 0426 07/25/18  [id number] 07/25/18 1525  TROPONINI 0.06* 0.08* 0.07* 0.08*   No results for input(s): PROBNP in the last 168 hours.   CHEMISTRY Recent Labs  Lab 07/06/2018 2210 07/25/18 0426 07/26/18 0333 07/27/18 0345  NA 134* 135 133* 129*  K 5.0 4.1 4.3 4.3  CL 101 101 99 96*  CO2 13* 19* 19* 20*  GLUCOSE 160* 154* 133* 152*  BUN 64* 61* 58* 64*  CREATININE 2.29* 2.28* 2.51* 2.96*  CALCIUM 8.7* 8.4* 8.4* 8.3*  MG  --   --  2.5* 2.7*  PHOS  --   --   --   4.7*   Estimated Creatinine Clearance: 24 mL/min (A) (by C-G formula based on SCr of 2.96 mg/dL (H)).   LIVER Recent Labs  Lab 07/10/2018 2210 07/25/18 0426 07/26/18 0333 07/27/18 0345  AST 44* 33 107* 188*  ALT QUANTITY NOT SUFFICIENT, UNABLE TO PERFORM TEST 98* 142* 241*  ALKPHOS 271* 263* 360* 391*  BILITOT 2.9* 2.5* 3.3* 4.2*  PROT 5.7* 5.2* 5.0* 4.7*  ALBUMIN 3.4* 3.0* 2.9* 3.0*  INR 1.60  --   --   --      INFECTIOUS Recent Labs  Lab 07/25/18 0426 07/25/18 0434 07/25/18 0659 07/26/18 0820  LATICACIDVEN 2.8*  --  2.6* 2.4*  PROCALCITON  --  0.21  --   --      ENDOCRINE CBG (last 3)  Recent Labs    07/27/18 0000 07/27/18 0348 07/27/18 0730  GLUCAP 109* 141* 118*         IMAGING x48h  - image(s) personally visualized  -   highlighted in bold Ct Abdomen Pelvis Wo Contrast  Result Date: 07/25/2018 CLINICAL DATA:  Hypotensive episode, near syncopal episode, nausea, vomiting, elevated LFTs, history atrial fibrillation, coronary artery disease, non ischemic cardiomyopathy, type II diabetes mellitus, arrhythmia., chronic systolic heart failure EXAM: CT ABDOMEN AND PELVIS WITHOUT CONTRAST TECHNIQUE: Multidetector CT imaging of the abdomen and pelvis was performed following the standard protocol without IV contrast. Sagittal and coronal MPR images reconstructed from axial data set. COMPARISON:  None FINDINGS: Lower chest: Bibasilar pleural effusions and compressive atelectasis greater on RIGHT. Enlargement of heart. Coronary arterial calcifications. Minimal pericardial effusion. Hepatobiliary: Gallbladder surgically absent. Liver small, unremarkable. Pancreas: Atrophic, unremarkable Spleen: Normal appearance Adrenals/Urinary Tract: Adrenal glands normal appearance. No gross renal mass or hydronephrosis. Bladder unremarkable. Stomach/Bowel: Appendix surgically absent by history. Extension of a non obstructed segment of sigmoid colon into a LEFT inguinal hernia. Sigmoid  diverticulosis. Long segment of questionable wall thickening versus artifact from underdistention at sigmoid colon, without adjacent focal inflammatory changes, can not exclude segment of colitis. Remaining bowel loops unremarkable. Stomach decompressed. Vascular/Lymphatic: Atherosclerotic calcifications aorta, iliac arteries, visceral branch vessels, coronary arteries. No definite adenopathy. Reproductive: Prostatic enlargement, gland measuring 5.8 x 5.5 by question 4.1 cm. Other: Diffuse soft tissue edema within body wall and within abdominal and retroperitoneal tissue planes. Small volume ascites. No free air. No hernia. Musculoskeletal: Unremarkable IMPRESSION: Bibasilar effusions and atelectasis greater on RIGHT. Diffuse soft tissue edema. Small volume ascites. Nonobstructed segment of sigmoid colon within a LEFT inguinal hernia. Sigmoid diverticulosis with questionable wall thickening versus artifact at sigmoid colon cannot exclude segment of sigmoid colitis. Prostatic enlargement. Electronically Signed   By: Lavonia Dana M.D.   On: 07/25/2018 13:54   Ct Head Wo Contrast  Result Date: 07/25/2018 CLINICAL DATA:  Hypotension. Near syncope. EXAM: CT HEAD WITHOUT CONTRAST TECHNIQUE: Contiguous axial images were obtained from the base of the skull through the  vertex without intravenous contrast. COMPARISON:  04/27/2009 FINDINGS: Brain: The brain does not show accelerated atrophy. There are mild chronic small-vessel changes of the white matter. No sign of acute infarction, mass lesion, hemorrhage, hydrocephalus or extra-axial collection. Vascular: There is atherosclerotic calcification of the major vessels at the base of the brain. Skull: Negative Sinuses/Orbits: Previous functional endoscopic sinus surgery. There are some cysts and/or polyps in the sinuses presently. Other: None IMPRESSION: 1. No acute finding by CT. Mild chronic small-vessel change of the white matter. 2. Previous functional endoscopic sinus  surgery. Some cysts and/or polyps in the sinuses presently. Electronically Signed   By: Nelson Chimes M.D.   On: 07/25/2018 13:45   US Abdomen Complete  Result Date: 07/26/2018 CLINICAL DATA:  Elevated LFTs, previous cholecystectomy, diabetes and hypertension EXAM: ABDOMEN ULTRASOUND COMPLETE COMPARISON:  07/25/2018 CT without contrast FINDINGS: Gallbladder: Surgically absent Common bile duct: Diameter: 4.8 mm Liver: No focal lesion identified. Within normal limits in parenchymal echogenicity. Portal vein is patent on color Doppler imaging with normal direction of blood flow towards the liver. IVC: No abnormality visualized. Pancreas: Visualized portion unremarkable. Spleen: 6.6 cm.  Normal in size.  Splenic granulomata noted. Right Kidney: Length: 11.4 cm. Echogenicity within normal limits. No mass or hydronephrosis visualized. Left Kidney: Length: 10.3 cm. Echogenicity within normal limits. No mass or hydronephrosis visualized. Abdominal aorta: Aortic atherosclerosis.  No significant aneurysm. Other findings: Small amount of abdominal ascites. Pleural effusions present bilaterally. IMPRESSION: No acute finding by abdominal ultrasound Remote cholecystectomy Aortic atherosclerosis without aneurysm Small amount of abdominal ascites and bilateral pleural effusions Electronically Signed   By: Jerilynn Mages.  Shick M.D.   On: 07/26/2018 09:27   Dg Chest Port 1 View  Result Date: 07/26/2018 CLINICAL DATA:  Central line infiltration, initial encounter. Pt was able to communicate and assist in movement EXAM: PORTABLE CHEST 1 VIEW COMPARISON:  Radiograph 08/02/2018 FINDINGS: New placement of a RIGHT central venous line tip in the distal SVC. No pneumothorax. Moderate RIGHT effusion. Effusion is increased from 2 days prior. There is a soft tissue / air interface across in the upper spine shadow which is felt to be external patient. LEFT lung is clear. IMPRESSION: 1. Central venous line on the RIGHT with no pneumothorax. 2.  Increasing RIGHT effusion. 3. Densely crossing the spine over the upper mediastinum is favored external to patient. Potentially skin fold. Electronically Signed   By: Suzy Bouchard M.D.   On: 07/26/2018 07:25     Assessment & Plan:  72 year old man with nonischemic cardiomyopathy, HFrEF (EF 40-45% on 09/04/2016), ICD removed in 2017 2/2 infection, paroxysmal atrial fibrillation not on anticoag, HTN, DM2 admitted on 12/20 with N/V x 1 week and falls x 2, found to be hypotensive now unresponsive to IV fluids. Transferred to ICU for pressor support.  Shock, possible cardiogenic, NICM, HFrEF: Unlikely to be septic given no leukocytosis and afebrile. Procal 0.21. UA neg. Hgb normal. Lactic acid yesterday 2.8>2.6. Possible component of hypovolemia but not responding to IV fluids now. TSH normal. Cortisol normal on 12/21. Troponin plateau at 0.07-0.08 on 12/21. BNP 1351.7  07/27/2018  - still pressor dependent. Anasaraca + .  PLAN -Levo gtt titrate for goal MAP > 65 -Hydrocort x 1 dose -Cardiology following -Follow up TTE -Home carvedilol, digoxin, Lasix, lisinopril held  AKI, Metabolic acidosis with mildly increased anion gap, mild hyponatremia: Creatinine 2.5, bicarb 19 and anion gap 15. Na 133. CT abd/pelvis w/o hydronephrosis. Probably pre-renal or ATN. Probably related to above.  07/27/2018 -  worse despite lasix  PLAN - fluid bolus -Continue to follow -Holding lisinopril  Transaminitis: Alk phos 360, total bilirubin 3.3, AST 107 and ALT 142 on 12/22. Possible congestive hepatopathy. [place] ok -Follow up acute hepatitis panel  -  Atrial fibrillation: rate controlled -Continue amio gtt  DM: A1c 7.6 -SSI  Best practice:  Diet: NPO Pain/Anxiety/Delirium protocol (if indicated): Delirium precautions VAP protocol (if indicated): Not indicated DVT prophylaxis: subq hep GI prophylaxis: not indicated Glucose control: SSI Mobility: OOB Code Status: FULL Family Communication: None at  bedside Disposition: ICU     ATTESTATION & SIGNATURE   The patient BRACEN SCHUM is critically ill with multiple organ systems failure and requires high complexity decision making for assessment and support, frequent evaluation and titration of therapies, application of advanced monitoring technologies and extensive interpretation of multiple databases.   Critical Care Time devoted to patient care services described in this note is  30  Minutes. This time reflects time of care of this signee Dr Brand Males. This critical care time does not reflect procedure time, or teaching time or supervisory time of PA/NP/Med student/Med Resident etc but could involve care discussion time     Dr. Brand Males, M.D., [facility].C.P Pulmonary and Critical Care Medicine Staff Physician [facility] Pager: [phone], If no answer or between  15:00h - 7:00h: call [phone]  07/27/2018 9:49 AM

## 2018-07-28 ENCOUNTER — Inpatient Hospital Stay (HOSPITAL_COMMUNITY): Payer: Medicare HMO

## 2018-07-28 ENCOUNTER — Other Ambulatory Visit: Payer: Self-pay

## 2018-07-28 DIAGNOSIS — R579 Shock, unspecified: Secondary | ICD-10-CM

## 2018-07-28 DIAGNOSIS — I361 Nonrheumatic tricuspid (valve) insufficiency: Secondary | ICD-10-CM

## 2018-07-28 DIAGNOSIS — I340 Nonrheumatic mitral (valve) insufficiency: Secondary | ICD-10-CM

## 2018-07-28 LAB — CBC
HCT: 47.5 % (ref 39.0–52.0)
Hemoglobin: 15.7 g/dL (ref 13.0–17.0)
MCH: 31.1 pg (ref 26.0–34.0)
MCHC: 33.1 g/dL (ref 30.0–36.0)
MCV: 94.1 fL (ref 80.0–100.0)
NRBC: 0.4 % — AB (ref 0.0–0.2)
Platelets: 142 10*3/uL — ABNORMAL LOW (ref 150–400)
RBC: 5.05 MIL/uL (ref 4.22–5.81)
RDW: 15.8 % — ABNORMAL HIGH (ref 11.5–15.5)
WBC: 10.2 10*3/uL (ref 4.0–10.5)

## 2018-07-28 LAB — BASIC METABOLIC PANEL
Anion gap: 14 (ref 5–15)
Anion gap: 13 (ref 5–15)
BUN: 65 mg/dL — ABNORMAL HIGH (ref 8–23)
BUN: 68 mg/dL — ABNORMAL HIGH (ref 8–23)
CO2: 18 mmol/L — ABNORMAL LOW (ref 22–32)
CO2: 19 mmol/L — ABNORMAL LOW (ref 22–32)
Calcium: 8.2 mg/dL — ABNORMAL LOW (ref 8.9–10.3)
Calcium: 8.4 mg/dL — ABNORMAL LOW (ref 8.9–10.3)
Chloride: 99 mmol/L (ref 98–111)
Chloride: 99 mmol/L (ref 98–111)
Creatinine, Ser: 3.21 mg/dL — ABNORMAL HIGH (ref 0.61–1.24)
Creatinine, Ser: 3.42 mg/dL — ABNORMAL HIGH (ref 0.61–1.24)
GFR calc Af Amer: 21 mL/min — ABNORMAL LOW (ref >60)
GFR calc Af Amer: 19 mL/min — ABNORMAL LOW (ref >60)
GFR calc non Af Amer: 18 mL/min — ABNORMAL LOW (ref >60)
GFR, EST NON AFRICAN AMERICAN: 17 mL/min — AB (ref >60)
Glucose, Bld: 160 mg/dL — ABNORMAL HIGH (ref 70–99)
Glucose, Bld: 146 mg/dL — ABNORMAL HIGH (ref 70–99)
Potassium: 5.5 mmol/L — ABNORMAL HIGH (ref 3.5–5.1)
Potassium: 5.3 mmol/L — ABNORMAL HIGH (ref 3.5–5.1)
Sodium: 131 mmol/L — ABNORMAL LOW (ref 135–145)
Sodium: 131 mmol/L — ABNORMAL LOW (ref 135–145)

## 2018-07-28 LAB — GLUCOSE, CAPILLARY
GLUCOSE-CAPILLARY: 121 mg/dL — AB (ref 70–99)
Glucose-Capillary: 100 mg/dL — ABNORMAL HIGH (ref 70–99)
Glucose-Capillary: 125 mg/dL — ABNORMAL HIGH (ref 70–99)
Glucose-Capillary: 130 mg/dL — ABNORMAL HIGH (ref 70–99)
Glucose-Capillary: 131 mg/dL — ABNORMAL HIGH (ref 70–99)
Glucose-Capillary: 143 mg/dL — ABNORMAL HIGH (ref 70–99)
Glucose-Capillary: 152 mg/dL — ABNORMAL HIGH (ref 70–99)
Glucose-Capillary: 161 mg/dL — ABNORMAL HIGH (ref 70–99)

## 2018-07-28 LAB — COOXEMETRY PANEL
Carboxyhemoglobin: 1 % (ref 0.5–1.5)
Carboxyhemoglobin: 1.1 % (ref 0.5–1.5)
Methemoglobin: 1.3 % (ref 0.0–1.5)
Methemoglobin: 1.5 % (ref 0.0–1.5)
O2 Saturation: 64 %
O2 Saturation: 77.3 %
TOTAL HEMOGLOBIN: 15.2 g/dL (ref 12.0–16.0)
Total hemoglobin: 15.4 g/dL (ref 12.0–16.0)

## 2018-07-28 LAB — HEPATIC FUNCTION PANEL
ALT: 231 U/L — ABNORMAL HIGH (ref 0–44)
AST: 133 U/L — ABNORMAL HIGH (ref 15–41)
Albumin: 2.8 g/dL — ABNORMAL LOW (ref 3.5–5.0)
Alkaline Phosphatase: 454 U/L — ABNORMAL HIGH (ref 38–126)
Bilirubin, Direct: 2.4 mg/dL — ABNORMAL HIGH (ref 0.0–0.2)
Indirect Bilirubin: 1.9 mg/dL — ABNORMAL HIGH (ref 0.3–0.9)
Total Bilirubin: 4.3 mg/dL — ABNORMAL HIGH (ref 0.3–1.2)
Total Protein: 4.8 g/dL — ABNORMAL LOW (ref 6.5–8.1)

## 2018-07-28 LAB — PROCALCITONIN: Procalcitonin: 0.66 ng/mL

## 2018-07-28 LAB — CK: Total CK: 103 U/L (ref 49–397)

## 2018-07-28 LAB — ECHOCARDIOGRAM COMPLETE
Height: 72 in
Weight: 3269.86 oz

## 2018-07-28 LAB — PROTIME-INR
INR: 2.19
Prothrombin Time: 24 seconds — ABNORMAL HIGH (ref 11.4–15.2)

## 2018-07-28 LAB — VITAMIN B12: Vitamin B-12: 3114 pg/mL — ABNORMAL HIGH (ref 180–914)

## 2018-07-28 LAB — MAGNESIUM: Magnesium: 2.4 mg/dL (ref 1.7–2.4)

## 2018-07-28 LAB — PHOSPHORUS: Phosphorus: 4.7 mg/dL — ABNORMAL HIGH (ref 2.5–4.6)

## 2018-07-28 LAB — PSA: Prostatic Specific Antigen: 11.09 ng/mL — ABNORMAL HIGH (ref 0.00–4.00)

## 2018-07-28 MED ORDER — AMIODARONE HCL 200 MG PO TABS
200.0000 mg | ORAL_TABLET | Freq: Every day | ORAL | Status: DC
  Administered 2018-07-28 – 2018-07-29 (×2): 200 mg via ORAL
  Filled 2018-07-28 (×3): qty 1

## 2018-07-28 MED ORDER — MILRINONE LACTATE IN DEXTROSE 20-5 MG/100ML-% IV SOLN
0.2500 ug/kg/min | INTRAVENOUS | Status: DC
  Administered 2018-07-28 – 2018-07-29 (×3): 0.25 ug/kg/min via INTRAVENOUS
  Filled 2018-07-28 (×4): qty 100

## 2018-07-28 MED ORDER — PERFLUTREN LIPID MICROSPHERE
INTRAVENOUS | Status: AC
  Administered 2018-07-28: 12:00:00
  Filled 2018-07-28: qty 10

## 2018-07-28 MED ORDER — SODIUM ZIRCONIUM CYCLOSILICATE 10 G PO PACK
10.0000 g | PACK | Freq: Two times a day (BID) | ORAL | Status: DC
  Administered 2018-07-28 (×2): 10 g via ORAL
  Filled 2018-07-28 (×3): qty 1

## 2018-07-28 MED ORDER — SODIUM CHLORIDE 0.9% FLUSH: 10.0000 mL | INTRAVENOUS | Status: DC | PRN

## 2018-07-28 MED ORDER — SODIUM CHLORIDE 0.9% FLUSH
10.0000 mL | Freq: Two times a day (BID) | INTRAVENOUS | Status: DC
  Administered 2018-07-28 – 2018-07-30 (×3): 10 mL

## 2018-07-28 MED ORDER — CHLORHEXIDINE GLUCONATE CLOTH 2 % EX PADS
6.0000 | MEDICATED_PAD | Freq: Every day | CUTANEOUS | Status: DC
  Administered 2018-07-28 – 2018-07-29 (×2): 6 via TOPICAL

## 2018-07-28 MED ORDER — PERFLUTREN LIPID MICROSPHERE
1.0000 mL | INTRAVENOUS | Status: AC | PRN
  Administered 2018-07-28: 2 mL via INTRAVENOUS
  Filled 2018-07-28 (×2): qty 10

## 2018-07-28 NOTE — Progress Notes (Signed)
  Echocardiogram 2D Echocardiogram has been performed.  [name] 07/28/2018, 11:16 AM

## 2018-07-28 NOTE — Consult Note (Signed)
Referring Provider: No ref. provider found Primary Care Physician:  [name], MD Primary Nephrologist:    Reason for Consultation: Progressive renal insufficiency  HPI: This is a very pleasant 72 year old gentleman.  He is very little self-care according to family.  He has a dilated cardiomyopathy ejection fraction 45%, history of AICD placement that was subsequently removed for infection in 2017 and a history of paroxysmal atrial fibrillation.  He also has a history of type 2 diabetes and hyperlipidemia.  He was brought to the emergency room by family due to weakness and falls.  There does not appear to be any history of alcohol or substance abuse  He was admitted on 07/28/18.  He has very low systolic blood pressures been running between 100 and 110 mmHg.  He is nonoliguric although there is been significant weight gain since his admission 79.4 kg current weight of 92.7 kg.  His urinalysis which showed moderate bilirubin and urine ketones positive, his urine was negative for protein.  On admission he had 3+ bilateral lower extremity edema and a left-sided inguinal hernia.  His outpatient medicines included carvedilol 25 mg twice daily Lasix 20 mg twice daily, Prinivil 10 mg daily, simvastatin 40 mg daily and digoxin 0.125 mg daily.  Labs sodium 131 potassium 5.5 chloride 99 CO2 18 glucose 160 BUN 65 creatinine 3.21 calcium 8.2 magnesium 2.4 phosphorus 4.7 albumin 2.8 ALT 231 AST 133 bilirubin 2.4   WBC 10.2 hemoglobin 15.7 platelets 142 urinalysis 07/25/2018 moderate bilirubin 15 ketone leukocytes nitrites negative protein negative specific gravity 1025   hep C negative hep B negative.  Hemoglobin A1c 7.6 plasma cortisol 22.9 TSH 3.791.  Abdominal ultrasound showed ascites pleural effusions.  Liver appears small on CT adrenal glands are normal no renal masses or hydronephrosis bladder is unremarkable spleen was normal pancreas atrophic.  Prostate was enlarged 5 cm.  Diffuse soft tissue edema  noted again on CT with inguinal hernia.  CT scan of head reveals no evidence of acute findings  Creatinine on admission was 2.29.  This is increased to 3.21 07/28/2018.  Creatinine 07/27/2018 at 6:00 was 1.38 this looks inconsistent with previous creatinines wonder if this is an error     Past Medical History:  Diagnosis Date  . Atrial fibrillation (HCC)    Declines anticoagulation  . CAD (coronary artery disease)    Minimal nonobstructive CAD at catheterization 2009  . Chronic systolic heart failure (HCC)   . Deafness in right ear   . Essential hypertension   . Hyperlipidemia   . Infection involving implantable cardioverter-defibrillator (ICD) [facility])    St. Jude biventricular ICD, device extraction November 2017 - Dr. Ladona Ridgel  . LBBB (left bundle branch block)   . Nonischemic cardiomyopathy (HCC)   . Pneumonia   . Type 2 diabetes mellitus (HCC)     Past Surgical History:  Procedure Laterality Date  . APPENDECTOMY    . CARDIAC DEFIBRILLATOR PLACEMENT  12/14/2007; 11/23/13   St. Jude Promote RF [id number] ICD placed by Dr. Graciela Husbands for CHF, nonischemic cardiomyopathy; gen change 11/2013 by Dr Johney Frame STJ  . CARDIAC DEFIBRILLATOR REMOVAL  06/04/2016  . CATARACT EXTRACTION W/PHACO Right 06/14/2013   Procedure: CATARACT EXTRACTION PHACO AND INTRAOCULAR LENS PLACEMENT (IOC);  Surgeon: Gemma Payor, MD;  Location: [facility];  Service: Ophthalmology;  Laterality: Right;  CDE:  21.74  . CATARACT EXTRACTION W/PHACO Left 07/08/2013   Procedure: CATARACT EXTRACTION PHACO AND INTRAOCULAR LENS PLACEMENT (IOC);  Surgeon: Gemma Payor, MD;  Location: [facility];  Service: Ophthalmology;  Laterality: Left;  CDE:24.66  . CHOLECYSTECTOMY    . Fiscula Repair    . Hearing Surgery    . HERNIA REPAIR    . ICD LEAD REMOVAL N/A 06/04/2016   Procedure: ICD LEAD REMOVAL;  Surgeon: Marinus Maw, MD;  Location: [facility] OR;  Service: Cardiovascular;  Laterality: N/A;  [place]wen to back up  . IMPLANTABLE CARDIOVERTER DEFIBRILLATOR  GENERATOR CHANGE N/A 11/23/2013   Procedure: IMPLANTABLE CARDIOVERTER DEFIBRILLATOR GENERATOR CHANGE;  Surgeon: Gardiner Rhyme, MD;  Location: [facility] CATH LAB;  Service: Cardiovascular;  Laterality: N/A;  . TEE WITHOUT CARDIOVERSION N/A 06/04/2016   Procedure: TRANSESOPHAGEAL ECHOCARDIOGRAM (TEE);  Surgeon: Marinus Maw, MD;  Location: [facility] OR;  Service: Cardiovascular;  Laterality: N/A;    Prior to Admission medications   Medication Sig Start Date End Date Taking? Authorizing Provider  carvedilol (COREG) 25 MG tablet TAKE 1 TABLET TWICE DAILY  WITH  MEALS Patient not taking: Reported on Aug 12, 2018 07/24/17   Jonelle Sidle, MD  digoxin (LANOXIN) 0.125 MG tablet TAKE 1 TABLET EVERY DAY Patient not taking: Reported on 08/12/18 07/24/17   Jonelle Sidle, MD  furosemide (LASIX) 40 MG tablet TAKE 1/2 TABLET TWICE DAILY Patient not taking: Reported on 2018/08/12 07/24/17   Jonelle Sidle, MD  lisinopril (PRINIVIL,ZESTRIL) 10 MG tablet TAKE 1 TABLET EVERY DAY Patient not taking: Reported on 08-12-18 07/24/17   Jonelle Sidle, MD  simvastatin (ZOCOR) 40 MG tablet TAKE 1 TABLET AT BEDTIME Patient not taking: Reported on Aug 12, 2018 07/24/17   Jonelle Sidle, MD    Current Facility-Administered Medications  Medication Dose Route Frequency Provider Last Rate Last Dose  . 0.9 %  sodium chloride infusion   Intravenous PRN Kalman Shan, MD   Stopped at 07/28/18 0208  . acetaminophen (TYLENOL) tablet 650 mg  650 mg Oral Q6H PRN Lora Paula, MD   650 mg at 07/26/18 0355   Or  . acetaminophen (TYLENOL) suppository 650 mg  650 mg Rectal Q6H PRN Lora Paula, MD      . amiodarone (PACERONE) tablet 200 mg  200 mg Oral Daily Nahser, Deloris Ping, MD   200 mg at 07/28/18 1610  . aspirin chewable tablet 81 mg  81 mg Oral Daily Lora Paula, MD   81 mg at 07/28/18 9604  . atorvastatin (LIPITOR) tablet 40 mg  40 mg Oral q1800 Lora Paula, MD   40 mg at 07/27/18 1745  .  heparin injection 5,000 Units  5,000 Units Subcutaneous Q8H Lora Paula, MD   5,000 Units at 07/28/18 [phone]  . insulin aspart (novoLOG) injection 1-3 Units  1-3 Units Subcutaneous Q4H Lora Paula, MD   1 Units at 07/28/18 [phone]  . norepinephrine (LEVOPHED) 4 mg in dextrose 5 % 250 mL (0.016 mg/mL) infusion  0-10 mcg/min Intravenous Titrated Griffin Basil T, MD 26.3 mL/hr at 07/28/18 1000 7 mcg/min at 07/28/18 1000  . sodium zirconium cyclosilicate (LOKELMA) packet 10 g  10 g Oral BID Kalman Shan, MD      . [facility] 3"X3" pad 1 each  1 each Topical Once Scatliffe, Gypsy Balsam, MD        Allergies as of 12-Aug-2018 - Review Complete Aug 12, 2018  Allergen Reaction Noted  . Penicillins Hives, Rash, and Other (See Comments)     Family History  Problem Relation Age of Onset  . Cancer Other   . Coronary artery disease Other   . Diabetes Other  Social History   Socioeconomic History  . Marital status: Married    Spouse name: Not on file  . Number of children: Not on file  . Years of education: Not on file  . Highest education level: Not on file  Occupational History  . Occupation: Retired  Engineer, productionocial Needs  . Financial resource strain: Not on file  . Food insecurity:    Worry: Not on file    Inability: Not on file  . Transportation needs:    Medical: Not on file    Non-medical: Not on file  Tobacco Use  . Smoking status: Former Smoker    Packs/day: 0.30    Years: 5.00    Pack years: 1.50    Types: Cigarettes, Cigars    Start date: 02/15/1959    Last attempt to quit: 08/05/1965    Years since quitting: 53.0  . Smokeless tobacco: Never Used  Substance and Sexual Activity  . Alcohol use: No    Alcohol/week: 0.0 standard drinks  . Drug use: No  . Sexual activity: Yes    Birth control/protection: None  Lifestyle  . Physical activity:    Days per week: Not on file    Minutes per session: Not on file  . Stress: Not on file  Relationships  . Social connections:     Talks on phone: Not on file    Gets together: Not on file    Attends religious service: Not on file    Active member of club or organization: Not on file    Attends meetings of clubs or organizations: Not on file    Relationship status: Not on file  . Intimate partner violence:    Fear of current or ex partner: Not on file    Emotionally abused: Not on file    Physically abused: Not on file    Forced sexual activity: Not on file  Other Topics Concern  . Not on file  Social History Narrative  . Not on file    Review of Systems: Gen: No fever sweats chills.  Afebrile since admission no skin rash no itching.  Weakness profound HEENT: No visual complaints, No history of Retinopathy. Normal external appearance No Epistaxis or Sore throat. No sinusitis.   CV: Denies chest pain, angina, palpitations, syncope, orthopnea, PND, peripheral edema, and claudication.  Lower extremity swelling Resp: Denies dyspnea at rest, dyspnea with exercise, cough, sputum, wheezing, coughing up blood, and pleurisy. GI: Denies vomiting blood, jaundice, and fecal incontinence.   Denies dysphagia or odynophagia.  N GU : Denies urinary burning, blood in urine, urinary frequency, urinary hesitancy, nocturnal urination, and urinary incontinence.  No renal calculi.  Enlarged prostate MS: Diffuse muscle weakness Derm: Denies rash, itching, dry skin, hives, moles, decubitus on back with dressing Psych: Flat affect Heme: Denies bruising, bleeding, and enlarged lymph nodes. Neuro: No headache.  No diplopia. No dysarthria.  No dysphasia.  No history of CVA.  No Seizures. No paresthesias.  Diffuse muscle weakness Endocrine No DM.  No Thyroid disease TSH in normal range.  No Adrenal disease.  Cortisol appears adequate  Physical Exam: Vital signs in last 24 hours: Temp:  [96.6 F (35.9 C)-98.2 F (36.8 C)] 96.6 F (35.9 C) (12/24 0753) Pulse Rate:  [47-99] 94 (12/24 0900) Resp:  [12-18] 18 (12/24 0000) BP:  (79-127)/(53-97) 101/73 (12/24 0900) SpO2:  [84 %-100 %] 98 % (12/24 0900) Weight:  [92.7 kg] 92.7 kg (12/24 0500) Last BM Date: Oct 10, 2017 General: Alert non-confused.  Oriented  to time place person slow speech apathetic Head:  Normocephalic and atraumatic. Eyes:  Sclera clear, no icterus.   Conjunctiva pink. Ears:  Normal auditory acuity.  Nose:  No deformity, discharge,  or lesions. Mouth:  No deformity or lesions, dentition normal. Neck:  Supple; no masses or thyromegaly. JVP not elevated Lungs:  Clear throughout to auscultation.   No wheezes, crackles, or rhonchi. No acute distress. Heart:  Regular rate and rhythm; no murmurs, clicks, rubs,  or gallops. Abdomen:  Soft, nontender and nondistended. No masses, hepatosplenomegaly or hernias noted. Normal bowel sounds, without guarding, and without rebound.   Msk:  Symmetrical without gross deformities. Normal posture. Pulses:  No carotid, renal, femoral bruits. DP and PT symmetrical and equal Extremities:  Without clubbing diffuse 3+ edema Neurologic:  Alert and  oriented x4;  grossly normal neurologically. Skin:  Intact without significant lesions or rashes. Cervical Nodes:  No significant cervical adenopathy. Psych:  Alert and cooperative.  Flat affect  Intake/Output from previous day: 12/23 0701 - 12/24 0700 In: 1916.6 [P.O.:160; I.V.:871.6; IV Piggyback:885.1] Out: 610 [Urine:450; Emesis/NG output:160] Intake/Output this shift: Total I/O In: 118.2 [I.V.:118.2] Out: 60 [Urine:60]  Lab Results: Recent Labs    07/26/18 0333 07/27/18 0345 07/28/18 0323  WBC 9.6 11.0* 10.2  HGB 15.3 15.1 15.7  HCT 46.2 46.1 47.5  PLT 150 143* 142*   BMET Recent Labs    07/27/18 0345 07/27/18 1134 07/27/18 1808 07/28/18 0323  NA 129* 132* 141 131*  K 4.3 4.2 2.0* 5.5*  CL 96* 100 123* 99  CO2 20* 18* 12* 18*  GLUCOSE 152* 157* 113* 160*  BUN 64* 62* 40* 65*  CREATININE 2.96* 2.79* 1.38* 3.21*  CALCIUM 8.3* 7.8* 4.0* 8.2*  PHOS  4.7*  --   --  4.7*   LFT Recent Labs    07/28/18 0323  PROT 4.8*  ALBUMIN 2.8*  AST 133*  ALT 231*  ALKPHOS 454*  BILITOT 4.3*  BILIDIR 2.4*  IBILI 1.9*   PT/INR No results for input(s): LABPROT, INR in the last 72 hours. Hepatitis Panel No results for input(s): HEPBSAG, HCVAB, HEPAIGM, HEPBIGM in the last 72 hours.  Studies/Results: No results found.  Assessment/Plan:  Acute kidney injury with systemic hypotension.  Urinalysis bland no obstruction on CT or ultrasound.  I do not think he has nephrotic syndrome secondary to renal failure there is no evidence that this patient has glomerular disease.  His bland urinalysis would not suggest that there is any evidence of interstitial nephritis.  My guess is that this is ischemic acute tubular necrosis.  He was on antihypertensive medications prior to admission including lisinopril , an ACE inhibitor.  This is been discontinued.  He was a little hyperkalemic on admission.  It may be worth checking creatinine kinase.  There was increase in liver enzymes on admission which again may be due to hypotension.  I would discontinue statin at this time.  Hypotension.  This could have been secondary to the use of antihypertensive agents however has failed to improve.  Cortisol levels are appropriate.  No evidence of infection.  2D echo ordered.  Known systolic heart failure.  Liver failure also would be into consideration will check INR platelet count appear to be within normal range.  Spleen was not enlarged on CT scan.  Increased liver enzymes will discontinue statin.  I am suspicious this is secondary to shock.  Worsening right-sided heart failure could explain this situation.  Although I did not see evidence  of increased JVP.  It is possible that patient could have pulmonary artery pathology.  It is also possible that there could be venous obstruction either at the level of the hepatic vein or inferior vena cava.  This could be secondary to a  hypercoagulable state.  Hyperkalemia patient has been started on Lokelma 10 g 3 times daily  Anemia does not appear to be an issue at this time  Hyponatremia.  Possibly secondary to hypovolemic state we will check urine sodium urine osmolality.  Lethargy.  Will check RPR HIV B12.  No evidence of infarct on CT head.  Thank you for this very interesting consult will be happy to follow along.   LOS: 3 [name] @TODAY @10 :17 AM

## 2018-07-28 NOTE — Progress Notes (Signed)
   07/28/18 2330  Vitals  BP (!) 82/52  MAP (mmHg) (!) 62  Pulse Rate (!) 115  ECG Heart Rate (!) 116   Patient's blood pressures have gotten softer throughout the shift. Patient AAOx4 and asymptomatic. See MAR for levophed titration. Also patient lying on right side and blood pressure cuff on left arm. Due to elevation of blood pressure cuff, suspicion that blood pressures are actually higher than readings. Patient asked to lay on his back and stated that isn't comfortable. Will continue to closely monitor and assess.

## 2018-07-28 NOTE — Progress Notes (Addendum)
Some labs reviewed  PSA - 11 and high. CT abd - Reproductive: Prostatic enlargement, gland measuring 5.8 x 5.5 by question 4.1 cm.  Repeat echo - ef 10-15% (jan 2018 - ef 45%)   A Likely has prostate cancer nos (refusing foley) Worsening systolic chf  P D/w renal Dr Hyman Hopes - nil acute for now ->  need urology consult at some point D/w Dr Elease Hashimoto -> he will d/w CHF service but for now ok to startr low dose milrinone   Overall prognosis very poor     SIGNATURE    Dr. Kalman Shan, M.D., F.C.C.P,  Pulmonary and Critical Care Medicine Staff Physician, [facility] Center Director - Interstitial Lung Disease  Program  Pulmonary Fibrosis [facility] [place], [place], [address]  Pager: [phone], If no answer or between  15:00h - 7:00h: call [phone] Telephone: [phone]  3:55 PM 07/28/2018

## 2018-07-28 NOTE — Progress Notes (Signed)
Pt. Demanding Bladder scan. 150cc found in bladder. Pt. Demanded I&O cath and refused Foley catheter. Pt. has had 5+ I&O caths since admission. Pt. Was educated on risk of infection and still demands I&Os. Discussed with Ramaswamy, MD. MD placed the following parameters: Bladder scan performed Q6 I&O only for >150cc

## 2018-07-28 NOTE — Progress Notes (Addendum)
NAME:  [name], MRN:  [id number], DOB:  1946/04/05, LOS: 3 ADMISSION DATE:  07/09/2018, CONSULTATION DATE:  07/25/2018 REFERRING MD:  [facility] hospitalist, CHIEF COMPLAINT:  hypotension  BRIEF  72 year old man with nonischemic cardiomyopathy, HFrEF (EF 40-45% on 09/04/2016), ICD removed in 2017 2/2 infection, paroxysmal atrial fibrillation not on anticoag, HTN, DM2 admitted on 08/03/2018 with N/V x 1 week and falls x 2. Was found to be hypotensive which responded to IV fluids. On morning of 12/22 was found to be hypotensive again and had received 3L of IV fluids and 12.5g albumin IV. Patient was transferred to ICU and started on levo gtt. Social : Not seen MD in 2 years - refuses.Does not take meds at home  Past Medical History  Nonischemic cardiomyopathy ICD removed in 2018 secondary infection Diabetes mellitus Paroxysmal atrial fibrillation Coronary artery disease  Significant Hospital Events   12/20 admission 12/21 - TSH 3.7, cortisol 22.9, CT  Head - no acute findings, small vessel changes, UA - dipstick - neg for protein, [place] - no cirrhosis/normal spleen 12/22 transfer to ICU 12/22 - Reports lower back pain and some dyspnea. Also reports feeling drowsy. No new complaints otherwise.\ 12/23 - Hypoonatremia, Oliguric overnight .On amio gtt and levophed gtt. Lasix given overnight and AKI worse  Consults:  Cardiology  Procedures:  12/22 RIJ CVC  Significant Diagnostic Tests:  CT abd/pelvis w/o contrast on 12/21: Bibasilar effusions and atelectasis greater on RIGHT. Diffuse soft tissue edema. Small volume ascites. Nonobstructed segment of sigmoid colon within a LEFT inguinal Hernia. Sigmoid diverticulosis with questionable wall thickening. Prostatic enlargement.  CT head w/o contrast on 12/21: no acute abnormalities, mild chronic small vessel changes  CXR on 12/20: Small right-sided pleural effusion with atelectasis or infiltrate at the right base, cardiomegaly with mild  central vascular congestion  Micro Data:  None  Antimicrobials:  None  Interim history/subjective:   12/24 - spurious labs yesterday pm. AKI worse. On levophed 36mg. - did not respond to hydrocortisone 1036mx 1. SBP low but MAP is good.  Posing significant care difficulties - refusing to participate (has capacity) such as foley but also demanding random in and out cath. Not happy he is not allowed to sleep  Objective   Blood pressure 101/73, pulse 94, temperature (!) 96.6 F (35.9 C), temperature source Axillary, resp. rate 18, height 6' (1.829 m), weight 92.7 kg, SpO2 98 %. CVP:  [14 mmHg-16 mmHg] 16 mmHg      Intake/Output Summary (Last 24 hours) at 07/28/2018 1001 Last data filed at 07/28/2018 0800 Gross per 24 hour  Intake 1660.13 ml  Output 510 ml  Net 1150.13 ml   Filed Weights   07/26/18 0650 07/27/18 0600 07/28/18 0500  Weight: 90.9 kg 92.2 kg 92.7 kg   General Appearance:   Deconditioned, apatehtic Head:  Normocephalic, without obvious abnormality, atraumatic Eyes:  PERRL - yes, conjunctiva/corneas - clear     Ears:  Normal external ear canals, both ears Nose:  G tube - no Throat:  ETT TUBE - no , OG tube - no Neck:  Supple,  No enlargement/tenderness/nodules Lungs: Clear to auscultation bilaterally,  Heart:  S1 and S2 normal, no murmur, CVP - x.  Pressors - LEVOPHED - 68m29mAbdomen:  Soft, no masses, no organomegaly Genitalia / Rectal:  Not done Extremities:  Extremities- EDEMA +, ANASARCA + Skin:  ntact in exposed areas . Sacral area - x Neurologic:  Sedation - non4e -> RASS - -1 without sedation . Moves  all 4s - yes. CAM-ICU - neg . Orientation - x3+ PSYCH: Apathetic, Flat affect, Denies depression    LABS    PULMONARY Recent Labs  Lab 07/26/18 0610  O2SAT 66.6    CBC Recent Labs  Lab 07/26/18 0333 07/27/18 0345 07/28/18 0323  HGB 15.3 15.1 15.7  HCT 46.2 46.1 47.5  WBC 9.6 11.0* 10.2  PLT 150 143* 142*    COAGULATION Recent Labs  Lab  07/21/2018 2210  INR 1.60    CARDIAC   Recent Labs  Lab 07/28/2018 2210 07/25/18 0426 07/25/18 0905 07/25/18 1525  TROPONINI 0.06* 0.08* 0.07* 0.08*   No results for input(s): PROBNP in the last 168 hours.   CHEMISTRY Recent Labs  Lab 07/26/18 0333 07/27/18 0345 07/27/18 1134 07/27/18 1808 07/28/18 0323  NA 133* 129* 132* 141 131*  K 4.3 4.3 4.2 2.0* 5.5*  CL 99 96* 100 123* 99  CO2 19* 20* 18* 12* 18*  GLUCOSE 133* 152* 157* 113* 160*  BUN 58* 64* 62* 40* 65*  CREATININE 2.51* 2.96* 2.79* 1.38* 3.21*  CALCIUM 8.4* 8.3* 7.8* 4.0* 8.2*  MG 2.5* 2.7*  --   --  2.4  PHOS  --  4.7*  --   --  4.7*   Estimated Creatinine Clearance: 22.2 mL/min (A) (by C-G formula based on SCr of 3.21 mg/dL (H)).   LIVER Recent Labs  Lab 07/25/2018 2210 07/25/18 0426 07/26/18 0333 07/27/18 0345 07/28/18 0323  AST 44* 33 107* 188* 133*  ALT QUANTITY NOT SUFFICIENT, UNABLE TO PERFORM TEST 98* 142* 241* 231*  ALKPHOS 271* 263* 360* 391* 454*  BILITOT 2.9* 2.5* 3.3* 4.2* 4.3*  PROT 5.7* 5.2* 5.0* 4.7* 4.8*  ALBUMIN 3.4* 3.0* 2.9* 3.0* 2.8*  INR 1.60  --   --   --   --      INFECTIOUS Recent Labs  Lab 07/25/18 0434  07/26/18 0820 07/27/18 1134 07/27/18 1410 07/28/18 0323  LATICACIDVEN  --    < > 2.4* 3.7* 2.7*  --   PROCALCITON 0.21  --   --  0.55  --  0.66   < > = values in this interval not displayed.     ENDOCRINE CBG (last 3)  Recent Labs    07/28/18 0411 07/28/18 0523 07/28/18 0752  GLUCAP 130* 152* 121*         IMAGING x48h  - image(s) personally visualized  -   highlighted in bold No results found.   Assessment & Plan:  72 year old man with nonischemic cardiomyopathy, HFrEF (EF 40-45% on 09/04/2016), ICD removed in 2017 2/2 infection, paroxysmal atrial fibrillation not on anticoag, HTN, DM2 admitted on 12/20 with N/V x 1 week and falls x 2, found to be hypotensive now unresponsive to IV fluids. Transferred to ICU for pressor support.  Shock, possible  cardiogenic, NICM, HFrEF: Unlikely to be septic given no leukocytosis and afebrile. Procal 0.21. UA neg. Hgb normal. Lactic acid yesterday 2.8>2.6. Possible component of hypovolemia but not responding to IV fluids now. TSH normal. Cortisol normal on 12/21. Troponin plateau at 0.07-0.08 on 12/21. BNP 1351.7  07/28/2018  - levophed needed 39mg - mainly due to low systolic though MAP 73.   PLAN -Goal (MAP > 65 + SBP > 80)  or ( SBP > 95 + MAP > 60) - Order ECHO - get SCVO2 - if low can consider milrinone -Cardiology following -Home carvedilol, digoxin, Lasix, lisinopril held  AKI, Metabolic acidosis with mildly increased anion gap, mild hyponatremia:  07/28/2018 - worse despite lasix 07/26/18 and fluids 07/27/18 . Has hyperkalemia Refusing foley cath. Demanding I/O cath at random   PLAN - start scheduled lokelma  - check PSA - renal cconsult called -Holding lisinopril - I/O cath to continue but RN given parameters   Anasarca  - unclear cause . No evidence of cirrhosis or proteinuria Normal TSH and cortisol at admit  - check HIV, b12, echo   Transaminitis: Alk phos 360, total bilirubin 3.3, AST 107 and ALT 142 on 12/22. Possible congestive hepatopathy. [place] ok -Follow up acute hepatitis panel    Atrial fibrillation: rate controlled -Continue amio gtt  DM: A1c 7.6 -SSI  GLOBAL - Apathy, self neglect at home - call palliative care for goals of care  Best practice:  Diet: NPO bu for meds and food when able Pain/Anxiety/Delirium protocol (if indicated): Delirium precautions VAP protocol (if indicated): Not indicated DVT prophylaxis: subq hep GI prophylaxis: not indicated Glucose control: SSI Mobility: OOB Code Status: FULL but pallaitive called Family Communication: None at bedside Disposition: ICU     ATTESTATION & SIGNATURE   The patient BEVERLEY SHERRARD is critically ill with multiple organ systems failure and requires high complexity decision making for  assessment and support, frequent evaluation and titration of therapies, application of advanced monitoring technologies and extensive interpretation of multiple databases.   Critical Care Time devoted to patient care services described in this note is  30  Minutes. This time reflects time of care of this signee Dr Brand Males. This critical care time does not reflect procedure time, or teaching time or supervisory time of PA/NP/Med student/Med Resident etc but could involve care discussion time     Dr. Brand Males, M.D., [facility].C.P Pulmonary and Critical Care Medicine Staff Physician [facility] Pager: [phone], If no answer or between  15:00h - 7:00h: call [phone]  07/28/2018 10:01 AM

## 2018-07-28 NOTE — Progress Notes (Signed)
eLink Physician-Brief Progress Note Patient Name: [name] DOB: Feb 05, 1946 MRN: [id number]   Date of Service  07/28/2018  HPI/Events of Note  Am labs reviewed K 5.5  eICU Interventions  Seems that 12/23 pm labs was erroneous low K that was replaced He has baseline LBBB so unable to look for EKG changes- will rpt BMET in 8h & treat K if still rising in setting of AKI     Intervention Category Intermediate Interventions: Diagnostic test evaluation  Satvik Parco V. Caliah Kopke 07/28/2018, 5:38 AM

## 2018-07-28 NOTE — Progress Notes (Signed)
Progress Note  Patient Name: [name] Date of Encounter: 07/28/2018  Primary Cardiologist: [name] / [name]   Subjective   72 year old gentleman with a history of dilated cardiomyopathy, AICD, paroxysmal atrial fibrillation who we are asked to see by the hospitalist for further evaluation and management of possible congestive heart failure.  The patient was admitted with hypotension and shock.  Echocardiogram from yesterday reveals mildly reduced left ventricular systolic function with an ejection fraction of 40 to 45%.  He has grade 1 diastolic dysfunction.  Is mild mitral regurgitation.  He has mild aortic insufficiency.   Feeling bladder pressure this am  Asking for I/O cath Does not want foley catheter  Still on  IV amio     Inpatient Medications    Scheduled Meds: . aspirin  81 mg Oral Daily  . atorvastatin  40 mg Oral q1800  . heparin  5,000 Units Subcutaneous Q8H  . insulin aspart  1-3 Units Subcutaneous Q4H  . THROMBI-PAD  1 each Topical Once   Continuous Infusions: . sodium chloride Stopped (07/28/18 0208)  . amiodarone 30 mg/hr (07/28/18 0700)  . norepinephrine (LEVOPHED) Adult infusion 7 mcg/min (07/28/18 0700)   PRN Meds: sodium chloride, acetaminophen **OR** acetaminophen   Vital Signs    Vitals:   07/28/18 0645 07/28/18 0700 07/28/18 0715 07/28/18 0753  BP: 91/69 91/65 (!) 94/55   Pulse: 92 93 90   Resp:      Temp:    (!) 96.6 F (35.9 C)  TempSrc:    Axillary  SpO2: 96% 95% 93%   Weight:      Height:        Intake/Output Summary (Last 24 hours) at 07/28/2018 0848 Last data filed at 07/28/2018 0700 Gross per 24 hour  Intake 1723.4 ml  Output 450 ml  Net 1273.4 ml   Filed Weights   07/26/18 0650 07/27/18 0600 07/28/18 0500  Weight: 90.9 kg 92.2 kg 92.7 kg    Telemetry        ? Atrial fib.   ( difficult to assess ) - Personally Reviewed  ECG       Physical Exam   GEN: Elderly gentleman.   More awake today  or  no manner. Complains of bladder fullness and wants to have an I/O cath  Neck: No JVD Cardiac: Irregl   Respiratory: Clear to auscultation bilaterally. GI: Soft, nontender, non-distended  MS:  2+ leg edema  Neuro:  minimally cooperative  Psych:   Unable to assess.   Labs    Chemistry Recent Labs  Lab 07/26/18 0333 07/27/18 0345 07/27/18 1134 07/27/18 1808 07/28/18 0323  NA 133* 129* 132* 141 131*  K 4.3 4.3 4.2 2.0* 5.5*  CL 99 96* 100 123* 99  CO2 19* 20* 18* 12* 18*  GLUCOSE 133* 152* 157* 113* 160*  BUN 58* 64* 62* 40* 65*  CREATININE 2.51* 2.96* 2.79* 1.38* 3.21*  CALCIUM 8.4* 8.3* 7.8* 4.0* 8.2*  PROT 5.0* 4.7*  --   --  4.8*  ALBUMIN 2.9* 3.0*  --   --  2.8*  AST 107* 188*  --   --  133*  ALT 142* 241*  --   --  231*  ALKPHOS 360* 391*  --   --  454*  BILITOT 3.3* 4.2*  --   --  4.3*  GFRNONAA 24* 20* 21* 50* 18*  GFRAA 28* 23* 25* 58* 21*  ANIONGAP 15 13 14 6 14      Hematology Recent  Labs  Lab 07/26/18 0333 07/27/18 0345 07/28/18 0323  WBC 9.6 11.0* 10.2  RBC 4.89 4.88 5.05  HGB 15.3 15.1 15.7  HCT 46.2 46.1 47.5  MCV 94.5 94.5 94.1  MCH 31.3 30.9 31.1  MCHC 33.1 32.8 33.1  RDW 15.1 15.1 15.8*  PLT 150 143* 142*    Cardiac Enzymes Recent Labs  Lab 07/29/2018 2210 07/25/18 0426 07/25/18 0905 07/25/18 1525  TROPONINI 0.06* 0.08* 0.07* 0.08*   No results for input(s): TROPIPOC in the last 168 hours.   BNP Recent Labs  Lab 07/25/18 0426  BNP 1,351.7*     DDimer No results for input(s): DDIMER in the last 168 hours.   Radiology    [place] Abdomen Complete  Result Date: 07/26/2018 CLINICAL DATA:  Elevated LFTs, previous cholecystectomy, diabetes and hypertension EXAM: ABDOMEN ULTRASOUND COMPLETE COMPARISON:  07/25/2018 CT without contrast FINDINGS: Gallbladder: Surgically absent Common bile duct: Diameter: 4.8 mm Liver: No focal lesion identified. Within normal limits in parenchymal echogenicity. Portal vein is patent on color Doppler imaging  with normal direction of blood flow towards the liver. IVC: No abnormality visualized. Pancreas: Visualized portion unremarkable. Spleen: 6.6 cm.  Normal in size.  Splenic granulomata noted. Right Kidney: Length: 11.4 cm. Echogenicity within normal limits. No mass or hydronephrosis visualized. Left Kidney: Length: 10.3 cm. Echogenicity within normal limits. No mass or hydronephrosis visualized. Abdominal aorta: Aortic atherosclerosis.  No significant aneurysm. Other findings: Small amount of abdominal ascites. Pleural effusions present bilaterally. IMPRESSION: No acute finding by abdominal ultrasound Remote cholecystectomy Aortic atherosclerosis without aneurysm Small amount of abdominal ascites and bilateral pleural effusions Electronically Signed   By: Alan PetitM.  Schwartz AlanD.   On: 07/26/2018 09:27    Cardiac Studies     Patient Profile     72 y.o. male who presents with hypotension and shock.  Echocardiogram to be done today.  Assessment & Plan    1.  Hypotension/shock: echo shows only minimal left ventricular dysfunction.  I suspect that his hypotension and shock was multi- factorial and I do not think that this was cardiogenic shock.  2.  Acute renal insufficiency: The patient has an acute renal failure.  His creatinine in October, 2017 was 0.83.  Today's creatinine is 2.96.  Further plan and management per the critical care team. Creatinine continues to go up.  He complains of not being able to empty his bladder.  He may need a urology consult.  3.  Troponin elevation: This is likely due to his hypotension.  No evidence of myocardial infarction at this time.  4.  Medical noncompliance: Patient apparently does not take his medications and does not go to the doctor on a regular basis.  This will make successful treatment of his problems very difficult/impossible.      For questions or updates, please contact [facility] Please consult [email or web address] for contact info under         Signed, Kristeen MissPhilip Tanganika Barradas, MD  07/28/2018, 8:48 AM

## 2018-07-29 DIAGNOSIS — R601 Generalized edema: Secondary | ICD-10-CM

## 2018-07-29 DIAGNOSIS — Z7189 Other specified counseling: Secondary | ICD-10-CM

## 2018-07-29 DIAGNOSIS — N170 Acute kidney failure with tubular necrosis: Secondary | ICD-10-CM

## 2018-07-29 DIAGNOSIS — Z515 Encounter for palliative care: Secondary | ICD-10-CM

## 2018-07-29 DIAGNOSIS — I428 Other cardiomyopathies: Secondary | ICD-10-CM

## 2018-07-29 DIAGNOSIS — R570 Cardiogenic shock: Secondary | ICD-10-CM

## 2018-07-29 DIAGNOSIS — R55 Syncope and collapse: Secondary | ICD-10-CM

## 2018-07-29 LAB — CBC
HCT: 43.8 % (ref 39.0–52.0)
HEMOGLOBIN: 14.8 g/dL (ref 13.0–17.0)
MCH: 31.4 pg (ref 26.0–34.0)
MCHC: 33.8 g/dL (ref 30.0–36.0)
MCV: 92.8 fL (ref 80.0–100.0)
Platelets: 109 10*3/uL — ABNORMAL LOW (ref 150–400)
RBC: 4.72 MIL/uL (ref 4.22–5.81)
RDW: 15.7 % — ABNORMAL HIGH (ref 11.5–15.5)
WBC: 13.9 10*3/uL — ABNORMAL HIGH (ref 4.0–10.5)
nRBC: 0.2 % (ref 0.0–0.2)

## 2018-07-29 LAB — PROCALCITONIN: Procalcitonin: 0.81 ng/mL

## 2018-07-29 LAB — HIV ANTIBODY (ROUTINE TESTING W REFLEX): HIV Screen 4th Generation wRfx: NONREACTIVE

## 2018-07-29 LAB — GLUCOSE, CAPILLARY
GLUCOSE-CAPILLARY: 130 mg/dL — AB (ref 70–99)
Glucose-Capillary: 117 mg/dL — ABNORMAL HIGH (ref 70–99)
Glucose-Capillary: 155 mg/dL — ABNORMAL HIGH (ref 70–99)
Glucose-Capillary: 138 mg/dL — ABNORMAL HIGH (ref 70–99)
Glucose-Capillary: 116 mg/dL — ABNORMAL HIGH (ref 70–99)

## 2018-07-29 LAB — BLOOD GAS, ARTERIAL
Acid-base deficit: 4.4 mmol/L — ABNORMAL HIGH (ref 0.0–2.0)
Bicarbonate: 19 mmol/L — ABNORMAL LOW (ref 20.0–28.0)
Drawn by: 40415
O2 Content: 4 L/min
O2 Saturation: 94.8 %
Patient temperature: 98.6
pCO2 arterial: 28.1 mmHg — ABNORMAL LOW (ref 32.0–48.0)
pH, Arterial: 7.443 (ref 7.350–7.450)
pO2, Arterial: 73.4 mmHg — ABNORMAL LOW (ref 83.0–108.0)

## 2018-07-29 LAB — BASIC METABOLIC PANEL
Anion gap: 14 (ref 5–15)
BUN: 73 mg/dL — ABNORMAL HIGH (ref 8–23)
CO2: 18 mmol/L — ABNORMAL LOW (ref 22–32)
CREATININE: 3.43 mg/dL — AB (ref 0.61–1.24)
Calcium: 8.1 mg/dL — ABNORMAL LOW (ref 8.9–10.3)
Chloride: 99 mmol/L (ref 98–111)
GFR calc Af Amer: 19 mL/min — ABNORMAL LOW (ref >60)
GFR calc non Af Amer: 17 mL/min — ABNORMAL LOW (ref >60)
Glucose, Bld: 143 mg/dL — ABNORMAL HIGH (ref 70–99)
Potassium: 4.6 mmol/L (ref 3.5–5.1)
Sodium: 131 mmol/L — ABNORMAL LOW (ref 135–145)

## 2018-07-29 LAB — MAGNESIUM: Magnesium: 2.5 mg/dL — ABNORMAL HIGH (ref 1.7–2.4)

## 2018-07-29 LAB — COOXEMETRY PANEL
Carboxyhemoglobin: 1.4 % (ref 0.5–1.5)
Methemoglobin: 1.4 % (ref 0.0–1.5)
O2 SAT: 77.3 %
Total hemoglobin: 15.2 g/dL (ref 12.0–16.0)

## 2018-07-29 LAB — RPR: RPR Ser Ql: NONREACTIVE

## 2018-07-29 LAB — PHOSPHORUS: Phosphorus: 4 mg/dL (ref 2.5–4.6)

## 2018-07-29 MED ORDER — NOREPINEPHRINE BITARTRATE 1 MG/ML IV SOLN
0.0000 ug/min | INTRAVENOUS | Status: DC
  Administered 2018-07-29: 10 ug/min via INTRAVENOUS
  Filled 2018-07-29: qty 4

## 2018-07-29 MED ORDER — NOREPINEPHRINE 16 MG/250ML-% IV SOLN
0.0000 ug/min | INTRAVENOUS | Status: DC
  Filled 2018-07-29: qty 250

## 2018-07-29 MED ORDER — NOREPINEPHRINE BITARTRATE 1 MG/ML IV SOLN
0.0000 ug/min | INTRAVENOUS | Status: DC
  Administered 2018-07-30: 7 ug/min via INTRAVENOUS
  Filled 2018-07-29 (×2): qty 4

## 2018-07-29 NOTE — Progress Notes (Signed)
Following eLink call, CCM team on the floor for another patient and came by. Since they are the primary team for this patient consulted regarding overnight events. Agree with labs to be drawn. Will await results.

## 2018-07-29 NOTE — Progress Notes (Addendum)
Progress Note  Patient Name: Alan BothWilliam A Schwartz Date of Encounter: 07/29/2018  Primary Cardiologist: Dr. Jonelle SidleSamuel G. Reginia Battie (last seen 10/2016)  Subjective   Feels achy all over, hands and legs remain edematous.  No chest pain or palpitations.  Inpatient Medications    Scheduled Meds: . amiodarone  200 mg Oral Daily  . aspirin  81 mg Oral Daily  . Chlorhexidine Gluconate Cloth  6 each Topical Daily  . heparin  5,000 Units Subcutaneous Q8H  . insulin aspart  1-3 Units Subcutaneous Q4H  . sodium chloride flush  10-40 mL Intracatheter Q12H  . sodium zirconium cyclosilicate  10 g Oral BID  . THROMBI-PAD  1 each Topical Once   Continuous Infusions: . sodium chloride Stopped (07/28/18 0208)  . milrinone 0.25 mcg/kg/min (07/29/18 0557)  . norepinephrine (LEVOPHED) Adult infusion 10 mcg/min (07/29/18 0601)   PRN Meds: sodium chloride, acetaminophen **OR** acetaminophen, sodium chloride flush   Vital Signs    Vitals:   07/29/18 0615 07/29/18 0630 07/29/18 0645 07/29/18 0700  BP: (!) 74/54 (!) 78/51 (!) 73/48 (!) 75/49  Pulse: (!) 121 (!) 121 (!) 122 (!) 122  Resp:      Temp:      TempSrc:      SpO2: 91% 92% 92% 91%  Weight:      Height:        Intake/Output Summary (Last 24 hours) at 07/29/2018 0711 Last data filed at 07/29/2018 0500 Gross per 24 hour  Intake 667.57 ml  Output 385 ml  Net 282.57 ml   Filed Weights   07/27/18 0600 07/28/18 0500 07/29/18 0500  Weight: 92.2 kg 92.7 kg 94.5 kg    Telemetry    Sinus tachycardia versus SVT with left bundle branch block.  Personally reviewed.  Physical Exam   GEN:  Chronically ill-appearing male, no acute distress.   Neck:  CVP approximately 12. Cardiac:  Rapid RRR, no gallop.  Respiratory: Nonlabored.  No wheezing. GI:  Protuberant, nontender, bowel sounds present. MS:  3+ leg edema, hand edema also noted.. Neuro:  Nonfocal. Psych: Alert and oriented x 3.  Labs    Chemistry Recent Labs  Lab  07/26/18 0333 07/27/18 0345  07/28/18 0323 07/28/18 1030 07/29/18 0141  NA 133* 129*   < > 131* 131* 131*  K 4.3 4.3   < > 5.5* 5.3* 4.6  CL 99 96*   < > 99 99 99  CO2 19* 20*   < > 18* 19* 18*  GLUCOSE 133* 152*   < > 160* 146* 143*  BUN 58* 64*   < > 65* 68* 73*  CREATININE 2.51* 2.96*   < > 3.21* 3.42* 3.43*  CALCIUM 8.4* 8.3*   < > 8.2* 8.4* 8.1*  PROT 5.0* 4.7*  --  4.8*  --   --   ALBUMIN 2.9* 3.0*  --  2.8*  --   --   AST 107* 188*  --  133*  --   --   ALT 142* 241*  --  231*  --   --   ALKPHOS 360* 391*  --  454*  --   --   BILITOT 3.3* 4.2*  --  4.3*  --   --   GFRNONAA 24* 20*   < > 18* 17* 17*  GFRAA 28* 23*   < > 21* 19* 19*  ANIONGAP 15 13   < > 14 13 14    < > = values in this interval not displayed.  Hematology Recent Labs  Lab 07/27/18 0345 07/28/18 0323 07/29/18 0141  WBC 11.0* 10.2 13.9*  RBC 4.88 5.05 4.72  HGB 15.1 15.7 14.8  HCT 46.1 47.5 43.8  MCV 94.5 94.1 92.8  MCH 30.9 31.1 31.4  MCHC 32.8 33.1 33.8  RDW 15.1 15.8* 15.7*  PLT 143* 142* 109*    Cardiac Enzymes Recent Labs  Lab 2018/07/28 2210 07/25/18 0426 07/25/18 0905 07/25/18 1525  TROPONINI 0.06* 0.08* 0.07* 0.08*   No results for input(s): TROPIPOC in the last 168 hours.   BNP Recent Labs  Lab 07/25/18 0426  BNP 1,351.7*     Radiology    No results found.  Cardiac Studies   Echocardiogram 07/28/2018: Study Conclusions  - Left ventricle: The cavity size was mildly dilated. Systolic   function was severely reduced. The estimated ejection fraction   was in the range of 10% to 15%. Diffuse hypokinesis. Doppler   parameters are consistent with a reversible restrictive pattern,   indicative of decreased left ventricular diastolic compliance   and/or increased left atrial pressure (grade 3 diastolic   dysfunction). Doppler parameters are consistent with high   ventricular filling pressure. - Aortic valve: Transvalvular velocity was within the normal range.   There  was no stenosis. There was trivial regurgitation. - Mitral valve: Calcified annulus. Transvalvular velocity was   within the normal range. There was no evidence for stenosis.   There was mild regurgitation. - Left atrium: The atrium was severely dilated. - Right ventricle: The cavity size was normal. Wall thickness was   normal. Systolic function was moderately reduced. - Right atrium: The atrium was mildly dilated. - Tricuspid valve: There was mild regurgitation. - Pulmonary arteries: Systolic pressure was moderately increased.   PA peak pressure: 53 mm Hg (S). - Pericardium, extracardiac: A trivial pericardial effusion was   identified.  Impressions:  - Compared with the echo 08/2016, systolic function is significantly   reduced.  Patient Profile     72 y.o. male with a history of nonischemic cardiomyopathy, previous [employer] biventricular ICD which ultimately required extraction due to infection and pocket erosion (November 2017), paroxysmal atrial fibrillation, essential hypertension, hyperlipidemia, type 2 diabetes mellitus, and inconsistent medical follow-up.  He presents with shock/hypotension in the setting of medication noncompliance, also acute renal failure.  Assessment & Plan    1.  History of nonischemic cardiomyopathy.  Follow-up echocardiogram done during this hospital stay indicates significant reduction in LVEF to approximately 10 to 15% range with restrictive diastolic filling pattern.  Also moderately reduced right ventricular contraction with moderate pulmonary hypertension.  Previous LVEF in January 2018 was 40 to 45%.  2.  Shock/hypotension, initially placed on Levophed per critical care team, more recently milrinone with co-oximetry low 60s now up into mid 70s.  Remains hypotensive with systolic in the 70s to 90s.  Also diffusely edematous with extravascular volume overload.  3.  Chronic left bundle branch block.  Patient had [employer] Jude biventricular ICD in the  past, extracted due to infection and pocket erosion in November 2017.  4.  Acute renal failure, creatinine up to 3.21.  He is being followed by nephrology.  Patient is oliguric.  Possibly ATN.  Discussed with nursing.  Would obtain ECG to clarify rhythm, could be sinus tachycardia or potentially SVT/flutter.  He has a history of atrial fibrillation.  I communicated with Dr. Gala Romney this morning, case had been discussed with him already by Dr. Elease Hashimoto and overall there are very limited options at  this point. Patient is on milrinone with improvement in co-oximetry although he remains hypotensive with evidence of extravascular volume overload and continued shock.  Prognosis is poor.  Suggest palliative care consultation.  Signed, Nona Dell, MD  07/29/2018, 7:11 AM

## 2018-07-29 NOTE — Progress Notes (Signed)
eLink Physician-Brief Progress Note Patient Name: TAHJ EWOLDT DOB: July 30, 1946 MRN: [id number]   Date of Service  07/29/2018  HPI/Events of Note    eICU Interventions  Bed side worried about HR 118. On Milrinone and had to go up on Levo from 5 to 10 for soft MAP. No obvious fever or leukocytosis. Low ef chf. Renal note seen. A fib , acute on CKD.  Plan: - get all AM labs now. Get ABG for any acidosis.      Intervention Category Major Interventions: Arrhythmia - evaluation and management Intermediate Interventions: Arrhythmia - evaluation and management;Hypotension - evaluation and management  Ranee Gosselin 07/29/2018, 1:56 AM

## 2018-07-29 NOTE — Progress Notes (Signed)
ELink consulted regarding vital signs. Throughout shift, BP softer and titration of levophed per MAR. HR has increased from 100 to 120 throughout shift. Based on trends this is new. Morning labs to be drawn now. Will continue to closely monitor.

## 2018-07-29 NOTE — Consult Note (Signed)
Consultation Note Date: 07/29/2018   Patient Name: [name]  DOB: 10-30-1945  MRN: [id number]  Age / Sex: 72 y.o., male  PCP: Manon Hilding, MD Referring Physician: Brand Males, MD  Reason for Consultation: Establishing goals of care  HPI/Patient Profile: 72 y.o. male  with past medical history of NICM with EF 40-45% January 2018, ICD placement but extracted s/t infection, atrial fibrillation (no anticoagulation), DM2 admitted on 07/28/2018 with weakness and falls with nausea and vomiting. Likely with cardiogenic shock with EF 10-15% along with grade 3 diastolic dysfunction. Hospitalization complicated by persistent hypotension requiring vasopressors and also placed on inotrope.   Clinical Assessment and Goals of Care: I met today with [name] but unfortunately he has no family at bedside. He appears scared and the only thing he can tell me about what is going on is that "I am trying to cooperative with their requests." I gently explained that he continues to be critically ill and that Alan heart and kidneys are not functioning well at all. I explained that this is why he is requiring all these potent medications and ICU care. We discussed that these interventions do not appear to be improving Alan heart or kidneys and we are concerned about him. He very clearly tells me "I want to live." I explain that Alan medical team is doing everything they can to help him improve but we also know that these interventions may not work. I told him we need to consider if he worsens what we should do or not do. At this time he is requesting full aggressive care and anything necessary to keep him alive (including ventilator and CPR). I was unable to discuss further as I could tell he was losing patience with conversing with me. He does give me permission to come back and visit with him tomorrow and permission to call  Alan family. He also seemed appreciative of prayers and appears to be a very spiritual person. He does not like to talk a lot or be asked a lot of questions. Emotional support provided.   I called and spoke with [name]. I gently explained that he continues to be very ill. She and her son plan to meet me tomorrow to further discuss. She has many concerns and appears to know that he is very ill and tells me that he would not eat anything at home and that she cannot take care of him at home. I told her we will discuss all these tomorrow.    Primary Decision Maker PATIENT    SUMMARY OF RECOMMENDATIONS   - I will meet again tomorrow. - Family to meet me tomorrow "around mid morning"  Code Status/Advance Care Planning:  Full code - per patient request, will discuss further tomorrow   Symptom Management:   Per PCCM and cardiology.   Palliative Prophylaxis:   Delirium Protocol, Frequent Pain Assessment and Turn Reposition  Additional Recommendations (Limitations, Scope, Preferences):  Full Scope Treatment  Psycho-social/Spiritual:   Desire for further Chaplaincy support:yes  Additional Recommendations: Caregiving  Support/Resources, Education on Hospice and Grief/Bereavement Support  Prognosis:   Prognosis very poor with multi-organ failure.   Discharge Planning: To Be Determined      Primary Diagnoses: Present on Admission: . Near syncope . SYSTOLIC HEART FAILURE, CHRONIC . Nonischemic dilated cardiomyopathy ([place]) . Nausea & vomiting . Hypotension . Elevated LFTs   I have reviewed the medical record, interviewed the patient and family, and examined the patient. The following aspects are pertinent.  Past Medical History:  Diagnosis Date  . Atrial fibrillation (HCC)    Declines anticoagulation  . CAD (coronary artery disease)    Minimal nonobstructive CAD at catheterization 2009  . Chronic systolic heart failure ([place])   . Deafness in right ear   . Essential  hypertension   . Hyperlipidemia   . Infection involving implantable cardioverter-defibrillator (ICD) [facility])    St. Jude biventricular ICD, device extraction November 2017 - Dr. Lovena Le  . LBBB (left bundle branch block)   . Nonischemic cardiomyopathy ([place])   . Pneumonia   . Type 2 diabetes mellitus ([place])    Social History   Socioeconomic History  . Marital status: Married    Spouse name: Not on file  . Number of children: Not on file  . Years of education: Not on file  . Highest education level: Not on file  Occupational History  . Occupation: Retired  Scientific laboratory technician  . Financial resource strain: Not on file  . Food insecurity:    Worry: Not on file    Inability: Not on file  . Transportation needs:    Medical: Not on file    Non-medical: Not on file  Tobacco Use  . Smoking status: Former Smoker    Packs/day: 0.30    Years: 5.00    Pack years: 1.50    Types: Cigarettes, Cigars    Start date: 02/15/1959    Last attempt to quit: 08/05/1965    Years since quitting: 53.0  . Smokeless tobacco: Never Used  Substance and Sexual Activity  . Alcohol use: No    Alcohol/week: 0.0 standard drinks  . Drug use: No  . Sexual activity: Yes    Birth control/protection: None  Lifestyle  . Physical activity:    Days per week: Not on file    Minutes per session: Not on file  . Stress: Not on file  Relationships  . Social connections:    Talks on phone: Not on file    Gets together: Not on file    Attends religious service: Not on file    Active member of club or organization: Not on file    Attends meetings of clubs or organizations: Not on file    Relationship status: Not on file  Other Topics Concern  . Not on file  Social History Narrative  . Not on file   Family History  Problem Relation Age of Onset  . Cancer Other   . Coronary artery disease Other   . Diabetes Other    Scheduled Meds: . amiodarone  200 mg Oral Daily  . aspirin  81 mg Oral Daily  . Chlorhexidine  Gluconate Cloth  6 each Topical Daily  . heparin  5,000 Units Subcutaneous Q8H  . insulin aspart  1-3 Units Subcutaneous Q4H  . sodium chloride flush  10-40 mL Intracatheter Q12H  . sodium zirconium cyclosilicate  10 g Oral BID  . THROMBI-PAD  1 each Topical Once   Continuous Infusions: . sodium chloride Stopped (07/28/18  0208)  . milrinone 0.25 mcg/kg/min (07/29/18 0800)  . norepinephrine (LEVOPHED) Adult infusion     PRN Meds:.sodium chloride, acetaminophen **OR** acetaminophen, sodium chloride flush Allergies  Allergen Reactions  . Penicillins Hives, Rash and Other (See Comments)    Has patient had a PCN reaction causing immediate rash, facial/tongue/throat swelling, SOB or lightheadedness with hypotension: Yes Has patient had a PCN reaction causing severe rash involving mucus membranes or skin necrosis: No Has patient had a PCN reaction that required hospitalization No Has patient had a PCN reaction occurring within the last 10 years: No If all of the above answers are "NO", then may proceed with Cephalosporin use.    Review of Systems  Constitutional: Positive for activity change, appetite change and fatigue.  Neurological: Positive for weakness.    Physical Exam Vitals signs and nursing note reviewed.  Constitutional:      Appearance: He is ill-appearing.     Comments: Sleepy, fatigued  Cardiovascular:     Rate and Rhythm: Tachycardia present.  Pulmonary:     Effort: No tachypnea, accessory muscle usage or respiratory distress.     Comments: Slight WOB at rest Neurological:     Mental Status: He is alert and oriented to person, place, and time.  Psychiatric:     Comments: Flat affect     Vital Signs: BP 99/61   Pulse (!) 122   Temp 97.7 F (36.5 C) (Oral)   Resp 14   Ht 6' (1.829 m)   Wt 94.5 kg   SpO2 95%   BMI 28.26 kg/m  Pain Scale: 0-10   Pain Score: 0-No pain   SpO2: SpO2: 95 % O2 Device:SpO2: 95 % O2 Flow Rate: .O2 Flow Rate (L/min): 2  L/min  IO: Intake/output summary:   Intake/Output Summary (Last 24 hours) at 07/29/2018 0850 Last data filed at 07/29/2018 0800 Gross per 24 hour  Intake 777.02 ml  Output 385 ml  Net 392.02 ml    LBM: Last BM Date: 07/08/2018 Baseline Weight: Weight: 79.4 kg Most recent weight: Weight: 94.5 kg     Palliative Assessment/Data:     Time In: 1030 Time Out: 1130 Time Total: 60 min Greater than 50%  of this time was spent counseling and coordinating care related to the above assessment and plan.  Signed by: Vinie Sill, NP Palliative Medicine Team Pager # [phone] (M-F 8a-5p) Team Phone # [phone] (Nights/Weekends)

## 2018-07-29 NOTE — Progress Notes (Signed)
[facility] ROUNDING NOTE   Subjective:   Awake and alert.  Profound hypotension noted.  Question of whether this is accurate.  2D echo ejection fraction 10 to 15%.  Blood pressure 87/61 pulse 125 temperature 97.7 O2 sats 95% 2 L nasal cannula CVP 5  IV milrinone 0.25 IV norepinephrine 6 mics  Bladder scan 387 07/29/2018 weight 94.5.  Urine output 60 cc recorded 07/28/2018  Sodium 131 potassium 4.6 chloride 99 CO2 18 glucose 143 BUN 73 creatinine 3.43 calcium 8.1 phosphorus 4.0 magnesium 2.5.  Co. ox O2 sats 77.3 PSA 11.09 HIV nonreactive RPR    nonreactive B12 3114 CPK 103   INR 2.19 PT 24  CT scan chest bilateral effusions and atelectasis in the right soft tissue edema small volume ascites 12/21 2019    Objective:  Vital signs in last 24 hours:  Temp:  [96.4 F (35.8 C)-97.7 F (36.5 C)] 97.7 F (36.5 C) (12/25 0756) Pulse Rate:  [93-123] 122 (12/25 0745) Resp:  [14] 14 (12/24 1200) BP: (73-109)/(40-78) 99/61 (12/25 0745) SpO2:  [88 %-99 %] 95 % (12/25 0745) Weight:  [94.5 kg] 94.5 kg (12/25 0500)  Weight change: 1.8 kg Filed Weights   07/27/18 0600 07/28/18 0500 07/29/18 0500  Weight: 92.2 kg 92.7 kg 94.5 kg    Intake/Output: I/O last 3 completed shifts: In: 1543.7 [P.O.:90; I.V.:1068.7; IV Piggyback:385] Out: 685 [Urine:685]   Intake/Output this shift:  Total I/O In: 126.1 [I.V.:126.1] Out: -    Awake alert follows commands CVS-tachycardic 3/6 systolic murmur RS-lung fields essentially clear with no wheezes or rales ABD-General anasarca EXT-3+ edema   Basic Metabolic Panel: Recent Labs  Lab 07/26/18 0333 07/27/18 0345 07/27/18 1134 07/27/18 1808 07/28/18 0323 07/28/18 1030 07/29/18 0141  NA 133* 129* 132* 141 131* 131* 131*  K 4.3 4.3 4.2 2.0* 5.5* 5.3* 4.6  CL 99 96* 100 123* 99 99 99  CO2 19* 20* 18* 12* 18* 19* 18*  GLUCOSE 133* 152* 157* 113* 160* 146* 143*  BUN 58* 64* 62* 40* 65* 68* 73*  CREATININE 2.51* 2.96* 2.79*  1.38* 3.21* 3.42* 3.43*  CALCIUM 8.4* 8.3* 7.8* 4.0* 8.2* 8.4* 8.1*  MG 2.5* 2.7*  --   --  2.4  --  2.5*  PHOS  --  4.7*  --   --  4.7*  --  4.0    Liver Function Tests: Recent Labs  Lab 07-Aug-2018 2210 07/25/18 0426 07/26/18 0333 07/27/18 0345 07/28/18 0323  AST 44* 33 107* 188* 133*  ALT QUANTITY NOT SUFFICIENT, UNABLE TO PERFORM TEST 98* 142* 241* 231*  ALKPHOS 271* 263* 360* 391* 454*  BILITOT 2.9* 2.5* 3.3* 4.2* 4.3*  PROT 5.7* 5.2* 5.0* 4.7* 4.8*  ALBUMIN 3.4* 3.0* 2.9* 3.0* 2.8*   No results for input(s): LIPASE, AMYLASE in the last 168 hours. No results for input(s): AMMONIA in the last 168 hours.  CBC: Recent Labs  Lab Aug 07, 2018 2210 07/25/18 0426 07/26/18 0333 07/27/18 0345 07/28/18 0323 07/29/18 0141  WBC 9.7 8.5 9.6 11.0* 10.2 13.9*  NEUTROABS 8.0* 6.7  --   --   --   --   HGB 15.5 14.7 15.3 15.1 15.7 14.8  HCT 48.0 43.8 46.2 46.1 47.5 43.8  MCV 96.2 94.4 94.5 94.5 94.1 92.8  PLT 141* 145* 150 143* 142* 109*    Cardiac Enzymes: Recent Labs  Lab Aug 07, 2018 2210 07/25/18 0426 07/25/18 0905 07/25/18 1525 07/28/18 1030  CKTOTAL  --   --   --   --  103  TROPONINI 0.06* 0.08* 0.07* 0.08*  --     BNP: Invalid input(s): POCBNP  CBG: Recent Labs  Lab 07/28/18 1625 07/28/18 1936 07/28/18 2324 07/29/18 0305 07/29/18 0748  GLUCAP 100* 125* 131* 117* 155*    Microbiology: Results for orders placed or performed during the hospital encounter of Apr 30, 2018  MRSA PCR Screening     Status: None   Collection Time: 07/26/18  6:04 AM  Result Value Ref Range Status   MRSA by PCR NEGATIVE NEGATIVE Final    Comment:        The GeneXpert MRSA Assay (FDA approved for NASAL specimens only), is one component of a comprehensive MRSA colonization surveillance program. It is not intended to diagnose MRSA infection nor to guide or monitor treatment for MRSA infections. Performed at [facility], [address] [place], [place] [id number]      Coagulation Studies: Recent Labs    07/28/18 1030  LABPROT 24.0*  INR 2.19    Urinalysis: No results for input(s): COLORURINE, LABSPEC, PHURINE, GLUCOSEU, HGBUR, BILIRUBINUR, KETONESUR, PROTEINUR, UROBILINOGEN, NITRITE, LEUKOCYTESUR in the last 72 hours.  Invalid input(s): APPERANCEUR    Imaging: No results found.   Medications:   . sodium chloride Stopped (07/28/18 0208)  . milrinone 0.25 mcg/kg/min (07/29/18 0800)  . norepinephrine (LEVOPHED) Adult infusion     . amiodarone  200 mg Oral Daily  . aspirin  81 mg Oral Daily  . Chlorhexidine Gluconate Cloth  6 each Topical Daily  . heparin  5,000 Units Subcutaneous Q8H  . insulin aspart  1-3 Units Subcutaneous Q4H  . sodium chloride flush  10-40 mL Intracatheter Q12H  . sodium zirconium cyclosilicate  10 g Oral BID  . THROMBI-PAD  1 each Topical Once   sodium chloride, acetaminophen **OR** acetaminophen, sodium chloride flush  Assessment/ Plan:   Acute kidney injury with systemic hypertension.  Bland urine sediment no evidence of nephrotic syndrome no evidence of glomerular disease no evidence insertion nephritis.  Most likely has ischemic acute tubular necrosis.  Avoid ACE inhibitor, ARB nonsteroidal anti-inflammatories, Cox 2 inhibitors, IV contrast.  CPK 103 not elevated.  Statin has been discontinued.  PT/INR increased suggests hepatic dysfunction.  Hepatorenal syndrome would also be in differential.  Will check urine sodium  Hypotension.  2D echo demonstrates ejection fraction 10 to 15%.  Also appears to have increased INR/PT suggestive of hepatic dysfunction.  Now requiring pressors and milrinone.  Sinus tachycardia noted.  Hyperkalemia corrected will discontinue Lokelma  Hyponatremia secondary to hypervolemic state with congestive heart failure and systolic dysfunction and hepatic failure  Lethargy somewhat improved RPR, HIV, B12 all negative PSA 11 would be suggestive of a prostate malignancy.   LOS:  4 [address]etta Bud[address] [place] @TODAY @9 :02 AM

## 2018-07-29 NOTE — Progress Notes (Addendum)
NAME:  [name], MRN:  [id number], DOB:  1946/07/29, LOS: 4 ADMISSION DATE:  07/09/2018, CONSULTATION DATE:  07/25/2018 REFERRING MD:  [facility] hospitalist, CHIEF COMPLAINT:  hypotension  BRIEF  72 year old man with nonischemic cardiomyopathy, HFrEF (EF 40-45% on 09/04/2016), ICD removed in 2017 2/2 infection, paroxysmal atrial fibrillation not on anticoag, HTN, DM2 admitted on 07/28/2018 with N/V x 1 week and falls x 2. Was found to be hypotensive which responded to IV fluids. On morning of 12/22 was found to be hypotensive again and had received 3L of IV fluids and 12.5g albumin IV. Patient was transferred to ICU and started on levo gtt. Social : Not seen MD in 2 years - refuses.Does not take meds at home  Past Medical History  Nonischemic cardiomyopathy ICD removed in 2018 secondary infection Diabetes mellitus Paroxysmal atrial fibrillation Coronary artery disease  Significant Hospital Events   12/20 admission 12/21 - TSH 3.7, cortisol 22.9, CT  Head - no acute findings, small vessel changes, UA - dipstick - neg for protein, [place] - no cirrhosis/normal spleen 12/22 transfer to ICU 12/22 - Reports lower back pain and some dyspnea. Also reports feeling drowsy. No new complaints otherwise.\ 12/23 - Hypoonatremia, Oliguric overnight .On amio gtt and levophed gtt. Lasix given overnight and AKI worse  Consults:  Cardiology  Procedures:  12/22 RIJ CVC  Significant Diagnostic Tests:  CT abd/pelvis w/o contrast on 12/21: Bibasilar effusions and atelectasis greater on RIGHT. Diffuse soft tissue edema. Small volume ascites. Nonobstructed segment of sigmoid colon within a LEFT inguinal Hernia. Sigmoid diverticulosis with questionable wall thickening. Prostatic enlargement.  CT head w/o contrast on 12/21: no acute abnormalities, mild chronic small vessel changes  CXR on 12/20: Small right-sided pleural effusion with atelectasis or infiltrate at the right base, cardiomegaly with mild  central vascular congestion  Micro Data:  None  Antimicrobials:  None  Interim history/subjective:   Currently on Levophed and milrinone but is tachycardic at 122.  Objective   Blood pressure (!) 75/49, pulse (!) 122, temperature (!) 97.5 F (36.4 C), temperature source Oral, resp. rate 14, height 6' (1.829 m), weight 94.5 kg, SpO2 91 %. CVP:  [7 mmHg-16 mmHg] 13 mmHg      Intake/Output Summary (Last 24 hours) at 07/29/2018 0742 Last data filed at 07/29/2018 0500 Gross per 24 hour  Intake 667.57 ml  Output 385 ml  Net 282.57 ml   Filed Weights   07/27/18 0600 07/28/18 0500 07/29/18 0500  Weight: 92.2 kg 92.7 kg 94.5 kg   General: Lethargic male in no acute distress HEENT: No JVD Neuro: Follows commands moves all extremities somewhat stunned CV: Heart sounds are distant PULM: even/non-labored, lungs bilaterally diminshed EO:FHQR, non-tender, bsx4 active  Extremities: warm/dry, 3+ edema  Skin: no rashes or lesions  LABS    PULMONARY Recent Labs  Lab 07/26/18 0610 07/28/18 1250 07/28/18 1855 07/29/18 0147 07/29/18 0340  PHART  --   --   --   --  7.443  PCO2ART  --   --   --   --  28.1*  PO2ART  --   --   --   --  73.4*  HCO3  --   --   --   --  19.0*  O2SAT 66.6 64.0 77.3 77.3 94.8    CBC Recent Labs  Lab 07/27/18 0345 07/28/18 0323 07/29/18 0141  HGB 15.1 15.7 14.8  HCT 46.1 47.5 43.8  WBC 11.0* 10.2 13.9*  PLT 143* 142* 109*  COAGULATION Recent Labs  Lab 08/04/2018 2210 07/28/18 1030  INR 1.60 2.19    CARDIAC   Recent Labs  Lab 07/05/2018 2210 07/25/18 0426 07/25/18 0905 07/25/18 1525  TROPONINI 0.06* 0.08* 0.07* 0.08*   No results for input(s): PROBNP in the last 168 hours.   CHEMISTRY Recent Labs  Lab 07/26/18 0333 07/27/18 0345 07/27/18 1134 07/27/18 1808 07/28/18 0323 07/28/18 1030 07/29/18 0141  NA 133* 129* 132* 141 131* 131* 131*  K 4.3 4.3 4.2 2.0* 5.5* 5.3* 4.6  CL 99 96* 100 123* 99 99 99  CO2 19* 20* 18* 12*  18* 19* 18*  GLUCOSE 133* 152* 157* 113* 160* 146* 143*  BUN 58* 64* 62* 40* 65* 68* 73*  CREATININE 2.51* 2.96* 2.79* 1.38* 3.21* 3.42* 3.43*  CALCIUM 8.4* 8.3* 7.8* 4.0* 8.2* 8.4* 8.1*  MG 2.5* 2.7*  --   --  2.4  --  2.5*  PHOS  --  4.7*  --   --  4.7*  --  4.0   Estimated Creatinine Clearance: 22.6 mL/min (A) (by C-G formula based on SCr of 3.43 mg/dL (H)).   LIVER Recent Labs  Lab 07/10/2018 2210 07/25/18 0426 07/26/18 0333 07/27/18 0345 07/28/18 0323 07/28/18 1030  AST 44* 33 107* 188* 133*  --   ALT QUANTITY NOT SUFFICIENT, UNABLE TO PERFORM TEST 98* 142* 241* 231*  --   ALKPHOS 271* 263* 360* 391* 454*  --   BILITOT 2.9* 2.5* 3.3* 4.2* 4.3*  --   PROT 5.7* 5.2* 5.0* 4.7* 4.8*  --   ALBUMIN 3.4* 3.0* 2.9* 3.0* 2.8*  --   INR 1.60  --   --   --   --  2.19     INFECTIOUS Recent Labs  Lab 07/26/18 0820 07/27/18 1134 07/27/18 1410 07/28/18 0323 07/29/18 0141  LATICACIDVEN 2.4* 3.7* 2.7*  --   --   PROCALCITON  --  0.55  --  0.66 0.81     ENDOCRINE CBG (last 3)  Recent Labs    07/28/18 1936 07/28/18 2324 07/29/18 0305  GLUCAP 125* 131* 117*         IMAGING x48h  - image(s) personally visualized  -   highlighted in bold No results found.   Assessment & Plan:  72 year old man with nonischemic cardiomyopathy, HFrEF (EF 40-45% on 09/04/2016), ICD removed in 2017 2/2 infection, paroxysmal atrial fibrillation not on anticoag, HTN, DM2 admitted on 12/20 with N/V x 1 week and falls x 2, found to be hypotensive now unresponsive to IV fluids. Transferred to ICU for pressor support.  Shock, possible cardiogenic, NICM, HFrEF: Unlikely to be septic given no leukocytosis and afebrile. Procal 0.21. UA neg. Hgb normal. Lactic acid yesterday 2.8>2.6. Possible component of hypovolemia but not responding to IV fluids now. TSH normal. Cortisol normal on 12/21. Troponin plateau at 0.07-0.08 on 12/21. BNP 1351.7  07/29/2018  - levophed needed 14mg - mainly due to low  systolic though MAP 73.   PLAN -Currently on Levophed and milrinone attempt to provide adequate cardiac output -07/29/1999 nineteen 2D echo with a EF of 10 to 15%  -Currently on milrinone and Levophed -Cardiology is following -Holding home antihypertensive medication  AKI, Metabolic acidosis with mildly increased anion gap, mild hyponatremia:   07/29/2018 - worse despite lasix 07/26/18 and fluids 07/27/18 . Has hyperkalemia Refusing foley cath. Demanding I/O cath at random   PLAN   -PSA noted to be 11.09 -Nephrology is involved -Hold lisinopril    Anasarca  -  Unclear cause -Continue to monitor, hopefully improve her cardiac output will improve edema  Transaminitis: Alk phos 360, total bilirubin 3.3, AST 107 and ALT 142 on 12/22. Possible congestive hepatopathy. [place] ok -Slight increase in enzymes   Atrial fibrillation: rate controlled -Amiodarone p.o.  DM: A1c 7.6 Sliding scale insulin protocol    Best practice:  Diet: NPO bu for meds and food when able Pain/Anxiety/Delirium protocol (if indicated): Delirium precautions VAP protocol (if indicated): Not indicated DVT prophylaxis: subq hep GI prophylaxis: not indicated Glucose control: SSI Mobility: OOB Code Status: FULL but pallaitive called.  07/29/2018 he may be end-stage at this point. Family Communication: 07/29/2018 no family at bedside Disposition: ICU     App cct 30 min  Richardson Landry Minor ACNP Maryanna Shape PCCM Pager [phone] till 1 pm If no answer page [phone] 07/29/2018, 7:42 AM  Attending Note:  72 year old male with CHF with EF of 10-15% presenting in cardiogenic shock.  Patient remains on levophed and milrinone.  On exam, crackles noted.  I reviewed CXR myself, pulmonary edema noted.  Discussed with PCCM-NP.  Will continue pressor support for now.  Cardiology feels there are very limited options at this point.  Will likely need to have a palliative component given poor prognosis.  Will need family  meeting in the next few days.  Hold in the ICU given pressor needs.  PCCM will continue to follow.  The patient is critically ill with multiple organ systems failure and requires high complexity decision making for assessment and support, frequent evaluation and titration of therapies, application of advanced monitoring technologies and extensive interpretation of multiple databases.   Critical Care Time devoted to patient care services described in this note is  32  Minutes. This time reflects time of care of this signee Dr Jennet Maduro. This critical care time does not reflect procedure time, or teaching time or supervisory time of PA/NP/Med student/Med Resident etc but could involve care discussion time.  Rush Farmer, M.D. [facility] Pulmonary/Critical Care Medicine. Pager: [phone]. After hours pager: [phone].

## 2018-07-30 ENCOUNTER — Inpatient Hospital Stay (HOSPITAL_COMMUNITY): Payer: Medicare HMO

## 2018-07-30 LAB — GLUCOSE, CAPILLARY
GLUCOSE-CAPILLARY: 116 mg/dL — AB (ref 70–99)
Glucose-Capillary: 128 mg/dL — ABNORMAL HIGH (ref 70–99)
Glucose-Capillary: 139 mg/dL — ABNORMAL HIGH (ref 70–99)
Glucose-Capillary: 134 mg/dL — ABNORMAL HIGH (ref 70–99)

## 2018-07-30 LAB — CBC WITH DIFFERENTIAL/PLATELET
Abs Immature Granulocytes: 0.04 10*3/uL (ref 0.00–0.07)
Basophils Absolute: 0 10*3/uL (ref 0.0–0.1)
Basophils Relative: 0 %
Eosinophils Absolute: 0 10*3/uL (ref 0.0–0.5)
Eosinophils Relative: 0 %
HEMATOCRIT: 39.3 % (ref 39.0–52.0)
HEMOGLOBIN: 13.4 g/dL (ref 13.0–17.0)
Immature Granulocytes: 0 %
Lymphocytes Relative: 4 %
Lymphs Abs: 0.5 10*3/uL — ABNORMAL LOW (ref 0.7–4.0)
MCH: 31.1 pg (ref 26.0–34.0)
MCHC: 34.1 g/dL (ref 30.0–36.0)
MCV: 91.2 fL (ref 80.0–100.0)
MONOS PCT: 7 %
Monocytes Absolute: 0.8 10*3/uL (ref 0.1–1.0)
Neutro Abs: 9.8 10*3/uL — ABNORMAL HIGH (ref 1.7–7.7)
Neutrophils Relative %: 89 %
Platelets: 98 10*3/uL — ABNORMAL LOW (ref 150–400)
RBC: 4.31 MIL/uL (ref 4.22–5.81)
RDW: 15.6 % — ABNORMAL HIGH (ref 11.5–15.5)
WBC: 11.1 10*3/uL — ABNORMAL HIGH (ref 4.0–10.5)
nRBC: 0.2 % (ref 0.0–0.2)

## 2018-07-30 LAB — BASIC METABOLIC PANEL
Anion gap: 15 (ref 5–15)
BUN: 76 mg/dL — ABNORMAL HIGH (ref 8–23)
CO2: 19 mmol/L — ABNORMAL LOW (ref 22–32)
Calcium: 7.8 mg/dL — ABNORMAL LOW (ref 8.9–10.3)
Chloride: 98 mmol/L (ref 98–111)
Creatinine, Ser: 3.8 mg/dL — ABNORMAL HIGH (ref 0.61–1.24)
GFR calc Af Amer: 17 mL/min — ABNORMAL LOW (ref >60)
GFR calc non Af Amer: 15 mL/min — ABNORMAL LOW (ref >60)
Glucose, Bld: 136 mg/dL — ABNORMAL HIGH (ref 70–99)
Potassium: 4.6 mmol/L (ref 3.5–5.1)
Sodium: 132 mmol/L — ABNORMAL LOW (ref 135–145)

## 2018-07-30 LAB — COOXEMETRY PANEL
Carboxyhemoglobin: 1.5 % (ref 0.5–1.5)
Methemoglobin: 1.4 % (ref 0.0–1.5)
O2 Saturation: 83.1 %
Total hemoglobin: 14 g/dL (ref 12.0–16.0)

## 2018-07-30 LAB — PHOSPHORUS: Phosphorus: 4.6 mg/dL (ref 2.5–4.6)

## 2018-07-30 LAB — MAGNESIUM: Magnesium: 2.3 mg/dL (ref 1.7–2.4)

## 2018-07-30 MED ORDER — GLYCOPYRROLATE 0.2 MG/ML IJ SOLN
0.2000 mg | Freq: Three times a day (TID) | INTRAMUSCULAR | Status: DC
  Administered 2018-07-30: 0.2 mg via INTRAVENOUS
  Filled 2018-07-30: qty 1

## 2018-07-30 MED ORDER — LORAZEPAM 2 MG/ML IJ SOLN: 0.5000 mg | INTRAMUSCULAR | Status: DC | PRN

## 2018-07-30 MED ORDER — BIOTENE DRY MOUTH MT LIQD: 15.0000 mL | OROMUCOSAL | Status: DC | PRN

## 2018-07-30 MED ORDER — ONDANSETRON 4 MG PO TBDP: 4.0000 mg | ORAL_TABLET | Freq: Four times a day (QID) | ORAL | Status: DC | PRN

## 2018-07-30 MED ORDER — HYDROMORPHONE BOLUS VIA INFUSION
0.5000 mg | INTRAVENOUS | Status: DC | PRN
  Filled 2018-07-30: qty 1

## 2018-07-30 MED ORDER — ONDANSETRON HCL 4 MG/2ML IJ SOLN: 4.0000 mg | Freq: Four times a day (QID) | INTRAMUSCULAR | Status: DC | PRN

## 2018-07-30 MED ORDER — POLYVINYL ALCOHOL 1.4 % OP SOLN
1.0000 [drp] | Freq: Four times a day (QID) | OPHTHALMIC | Status: DC | PRN
  Filled 2018-07-30: qty 15

## 2018-07-30 MED ORDER — SODIUM CHLORIDE 0.9 % IV SOLN
0.5000 mg/h | INTRAVENOUS | Status: DC
  Administered 2018-07-30: 0.5 mg/h via INTRAVENOUS
  Filled 2018-07-30: qty 2.5

## 2018-07-30 NOTE — Progress Notes (Signed)
NAME:  [name], MRN:  [id number], DOB:  1945/10/21, LOS: 5 ADMISSION DATE:  2018-08-16, CONSULTATION DATE:  07/25/2018 REFERRING MD:  [facility] hospitalist, CHIEF COMPLAINT:  hypotension  BRIEF  72 year old man with nonischemic cardiomyopathy, HFrEF (EF 40-45% on 09/04/2016), ICD removed in 2017 2/2 infection, paroxysmal atrial fibrillation not on anticoag, HTN, DM2 admitted on Aug 16, 2018 with N/V x 1 week and falls x 2. Was found to be hypotensive which responded to IV fluids. On morning of 12/22 was found to be hypotensive again and had received 3L of IV fluids and 12.5g albumin IV. Patient was transferred to ICU and started on levo gtt. Social : Not seen MD in 2 years - refuses.Does not take meds at home  Past Medical History  Nonischemic cardiomyopathy ICD removed in 2018 secondary infection Diabetes mellitus Paroxysmal atrial fibrillation Coronary artery disease  Significant Hospital Events   12/20 admission 12/21 - TSH 3.7, cortisol 22.9, CT  Head - no acute findings, small vessel changes, UA - dipstick - neg for protein, [place] - no cirrhosis/normal spleen 12/22 transfer to ICU 12/22 - Reports lower back pain and some dyspnea. Also reports feeling drowsy. No new complaints otherwise.\ 12/23 - Hypoonatremia, Oliguric overnight .On amio gtt and levophed gtt. Lasix given overnight and AKI worse. 12/24 -. PSA 11 and suspected prostated cancer. EF 10%. Start milrinone 12/25 -  Currently on Levophed and milrinone but is tachycardic at 122. Palliative care consult - conflicting messages from patient - full code but refusing   Consults:  Cardiology  Procedures:  12/22 RIJ CVC  Significant Diagnostic Tests:  CT abd/pelvis w/o contrast on 12/21: Bibasilar effusions and atelectasis greater on RIGHT. Diffuse soft tissue edema. Small volume ascites. Nonobstructed segment of sigmoid colon within a LEFT inguinal Hernia. Sigmoid diverticulosis with questionable wall thickening. Prostatic  enlargement.  CT head w/o contrast on 12/21: no acute abnormalities, mild chronic small vessel changes  CXR on 12/20: Small right-sided pleural effusion with atelectasis or infiltrate at the right base, cardiomegaly with mild central vascular congestion  Micro Data:  None  Antimicrobials:  None  Interim history/subjective:   07/29/18 - d/w Dr Teressa Lower - given non-compliance advanced heart failure Rx not possible. Palliation only option. Patient stated to cards and PT today that he is ready to die. Remains on milrinone and levophed . Worsening AKI to 3.8. Family at bedside - patient reports to this MD -> ready to die.Suffering too much. Has capcity  Objective   Blood pressure (!) 82/54, pulse (!) 123, temperature 97.6 F (36.4 C), temperature source Oral, resp. rate 20, height 6' (1.829 m), weight 96.4 kg, SpO2 92 %. CVP:  [2 mmHg-11 mmHg] 8 mmHg      Intake/Output Summary (Last 24 hours) at 07/30/2018 0911 Last data filed at 07/30/2018 0800 Gross per 24 hour  Intake 494.84 ml  Output 400 ml  Net 94.84 ml   Filed Weights   07/28/18 0500 07/29/18 0500 07/30/18 0500  Weight: 92.7 kg 94.5 kg 96.4 kg    General Appearance:  Looks deconditioned. Anasarca Head:  Normocephalic, without obvious abnormality, atraumatic Eyes:  PERRL - yes, conjunctiva/corneas - clear     Ears:  Normal external ear canals, both ears Nose:  G tube - no but has [place] + Throat:  ETT TUBE - no , OG tube - no Neck:  Supple,  No enlargement/tenderness/nodules Lungs: Clear to auscultation bilaterally, Heart:  S1 and S2 normal, no murmur, CVP - n.  Pressors - yes, levophed  and milrinone Abdomen:  Soft, no masses, no organomegaly Genitalia / Rectal:  Not done Extremities:  Extremities- edema Skin:  ntact in exposed areas .  Neurologic:  Sedation - none -> RASS - +1 . Moves all 4s - yes. CAM-ICU - neg . Orientation - x3+      LABS    PULMONARY Recent Labs  Lab 07/28/18 1250 07/28/18 1855  07/29/18 0147 07/29/18 0340 07/30/18 0620  PHART  --   --   --  7.443  --   PCO2ART  --   --   --  28.1*  --   PO2ART  --   --   --  73.4*  --   HCO3  --   --   --  19.0*  --   O2SAT 64.0 77.3 77.3 94.8 83.1    CBC Recent Labs  Lab 07/28/18 0323 07/29/18 0141 07/30/18 0541  HGB 15.7 14.8 13.4  HCT 47.5 43.8 39.3  WBC 10.2 13.9* 11.1*  PLT 142* 109* 98*    COAGULATION Recent Labs  Lab Nov 06, 2017 2210 07/28/18 1030  INR 1.60 2.19    CARDIAC   Recent Labs  Lab Nov 06, 2017 2210 07/25/18 0426 07/25/18 0905 07/25/18 1525  TROPONINI 0.06* 0.08* 0.07* 0.08*   No results for input(s): PROBNP in the last 168 hours.   CHEMISTRY Recent Labs  Lab 07/26/18 0333 07/27/18 0345  07/27/18 1808 07/28/18 0323 07/28/18 1030 07/29/18 0141 07/30/18 0541  NA 133* 129*   < > 141 131* 131* 131* 132*  K 4.3 4.3   < > 2.0* 5.5* 5.3* 4.6 4.6  CL 99 96*   < > 123* 99 99 99 98  CO2 19* 20*   < > 12* 18* 19* 18* 19*  GLUCOSE 133* 152*   < > 113* 160* 146* 143* 136*  BUN 58* 64*   < > 40* 65* 68* 73* 76*  CREATININE 2.51* 2.96*   < > 1.38* 3.21* 3.42* 3.43* 3.80*  CALCIUM 8.4* 8.3*   < > 4.0* 8.2* 8.4* 8.1* 7.8*  MG 2.5* 2.7*  --   --  2.4  --  2.5* 2.3  PHOS  --  4.7*  --   --  4.7*  --  4.0 4.6   < > = values in this interval not displayed.   Estimated Creatinine Clearance: 20.5 mL/min (A) (by C-G formula based on SCr of 3.8 mg/dL (H)).   LIVER Recent Labs  Lab Nov 06, 2017 2210 07/25/18 0426 07/26/18 0333 07/27/18 0345 07/28/18 0323 07/28/18 1030  AST 44* 33 107* 188* 133*  --   ALT QUANTITY NOT SUFFICIENT, UNABLE TO PERFORM TEST 98* 142* 241* 231*  --   ALKPHOS 271* 263* 360* 391* 454*  --   BILITOT 2.9* 2.5* 3.3* 4.2* 4.3*  --   PROT 5.7* 5.2* 5.0* 4.7* 4.8*  --   ALBUMIN 3.4* 3.0* 2.9* 3.0* 2.8*  --   INR 1.60  --   --   --   --  2.19     INFECTIOUS Recent Labs  Lab 07/26/18 0820 07/27/18 1134 07/27/18 1410 07/28/18 0323 07/29/18 0141  LATICACIDVEN 2.4*  3.7* 2.7*  --   --   PROCALCITON  --  0.55  --  0.66 0.81     ENDOCRINE CBG (last 3)  Recent Labs    07/29/18 2350 07/30/18 0616 07/30/18 0806  GLUCAP 116* 139* 134*         IMAGING x48h  - image(s) personally visualized  -  highlighted in bold Dg Chest Port 1 View  Result Date: 07/30/2018 CLINICAL DATA:  Abnormal respiration. History of diabetes and atrial fibrillation. EXAM: PORTABLE CHEST 1 VIEW COMPARISON:  Radiographs 07/26/2018 and 07/30/2018. FINDINGS: 0557 hours. Right IJ central venous catheter is unchanged, tip at the mid SVC level. There is stable mild cardiomegaly and aortic atherosclerosis. Layering right-greater-than-left pleural effusions, bibasilar atelectasis and mild edema are similar to recent priors. No pneumothorax or acute osseous findings. IMPRESSION: Persistent layering right-greater-than-left pleural effusions, bibasilar atelectasis and mild edema. Electronically Signed   By: Carey Bullocks M.D.   On: 07/30/2018 08:10     Assessment & Plan:  72 year old man with nonischemic cardiomyopathy, HFrEF (EF 40-45% on 09/04/2016), ICD removed in 2017 2/2 infection, paroxysmal atrial fibrillation not on anticoag, HTN, DM2 admitted on 12/20 with N/V x 1 week and falls x 2, found to be hypotensive now unresponsive to IV fluids. Transferred to ICU for pressor support.  Shock, possible cardiogenic, NICM, HFrEF: Unlikely to be septic given no leukocytosis and afebrile. Procal 0.21. UA neg. Hgb normal. Lactic acid yesterday 2.8>2.6. Possible component of hypovolemia but not responding to IV fluids now. TSH normal. Cortisol normal on 12/21. Troponin plateau at 0.07-0.08 on 12/21. BNP 1351.7. EF 10% on 12/24   07/30/2018  -ongoing milrionone and levophed need  PLAN Levophed and milrinone to continue pending goals of care   AKI, Metabolic acidosis with mildly increased anion gap, mild hyponatremia:   07/30/2018  - worse. PSA 11 and suspected prostate  cancer   PLAN -Nephrology is involved -Hold lisinopril   Atrial fibrillation: rate controlled -Amiodarone p.o.  DM: A1c 7.6 Sliding scale insulin protocol    Best practice:  Diet: NPO bu for meds and food when able Pain/Anxiety/Delirium protocol (if indicated): Delirium precautions VAP protocol (if indicated): Not indicated DVT prophylaxis: subq hep GI prophylaxis: not indicated Glucose control: SSI Mobility: OOB Code Status: FULL but pallaitive called.  07/29/2018 goals of care with palliative team - likely will be terminal care after this meeting Family Communication: 07/29/2018 no family at bedside Disposition: ICU but will move to palliative floor if still alive 08-20-18 and if goal is full comfort      ATTESTATION & SIGNATURE   The patient FENDER HERDER is critically ill with multiple organ systems failure and requires high complexity decision making for assessment and support, frequent evaluation and titration of therapies, application of advanced monitoring technologies and extensive interpretation of multiple databases.   Critical Care Time devoted to patient care services described in this note is  30  Minutes. This time reflects time of care of this signee Dr Kalman Shan. This critical care time does not reflect procedure time, or teaching time or supervisory time of PA/NP/Med student/Med Resident etc but could involve care discussion time     Dr. Kalman Shan, M.D., [facility].C.P Pulmonary and Critical Care Medicine Staff Physician [facility] [place] [facility] Pager: [phone], If no answer or between  15:00h - 7:00h: call [phone]  07/30/2018 9:11 AM

## 2018-07-30 NOTE — Progress Notes (Signed)
PT Cancellation Note  Patient Details Name: [name] MRN: [id number] DOB: [birth year]   Cancelled Treatment:    Reason Eval/Treat Not Completed: Other (comment). Pt declining need for physical therapy services, stating, "I am done with suffering, I am ready to move on." Seems to have a good understanding of prognosis. RN and MD aware. Second palliative meeting planned today.  Laurina Bustle, PT, DPT Acute Rehabilitation Services Pager [phone] Office [phone]    Vanetta Mulders 07/30/2018, 8:56 AM

## 2018-07-30 NOTE — Progress Notes (Addendum)
Palliative:  I met today again with Mr. Brammer as well as with his wife, son, and daughter-in-law at bedside. Mr. Lamartina is explaining to his family and myself that he is suffering and does not wish to continue anything that would prolong his life and he is ready to die. They all understand that his heart and kidneys are failing and that even with aggressive care we do not expect this to improve. He is anxious to begin this process of transition to full comfort care as soon as possible. Discussed with them that we will transition from his vasopressor and inotrope to pain medication infusion that will assist with any pain and shortness of breath. This will allow best transition to ensure comfort as he is currently hurting and with work of breathing. Family is saddened but also supportive of his decision. I have offered them chaplain support but patient and family decline at this time - educated that this is available as desired.   Exam: Alert, oriented. Mild-mod SOB on 3L oxygen.   Plan: - Full comfort care, DNR - D/C Levophed and milrinone - Hydromorphone @ 0.5 mg/hr with 0.5-1 mg bolus every 15 min prn pain/SOB.  - Robinul 0.2 mg IV TID. - Ativan 0.5 mg IV every 4 hours prn anxiety/agitation. - D/C tele for patient comfort.  - Continue bladder scan and I/O cath as needed per patient request.  - IF stable this afternoon may consider transition to 6N for EOL care.  - Anticipate hospital death.   50 min   Vinie Sill, NP Palliative Medicine Team Pager # [phone] (M-F 8a-5p) Team Phone # [phone] (Nights/Weekends)

## 2018-07-30 NOTE — Progress Notes (Signed)
Progress Note  Patient Name: [name] Date of Encounter: 07/30/2018  Primary Cardiologist: [name] / Ladona Ridgel   Subjective   72 year old gentleman with a history of dilated cardiomyopathy, AICD, paroxysmal atrial fibrillation who we are asked to see by the hospitalist for further evaluation and management of possible congestive heart failure.  The patient was admitted with hypotension and shock.  Echocardiogram from yesterday reveals mildly reduced left ventricular systolic function with an ejection fraction of 40 to 45%.  He has grade 1 diastolic dysfunction.  Is mild mitral regurgitation.  He has mild aortic insufficiency.   No significant changes Has been seen by palliative care I have spoken to Dr. Gala Romney and there are no options from an Advance Heart Failure standpoint  He says that he is ready to die. Does not want to prolong his suffering   Inpatient Medications    Scheduled Meds: . amiodarone  200 mg Oral Daily  . aspirin  81 mg Oral Daily  . Chlorhexidine Gluconate Cloth  6 each Topical Daily  . heparin  5,000 Units Subcutaneous Q8H  . insulin aspart  1-3 Units Subcutaneous Q4H  . sodium chloride flush  10-40 mL Intracatheter Q12H  . THROMBI-PAD  1 each Topical Once   Continuous Infusions: . sodium chloride 10 mL/hr at 07/29/18 2000  . milrinone 0.25 mcg/kg/min (07/29/18 2000)  . norepinephrine (LEVOPHED) Adult infusion 3 mcg/min (07/29/18 2000)   PRN Meds: sodium chloride, acetaminophen **OR** acetaminophen, sodium chloride flush   Vital Signs    Vitals:   07/30/18 0509 07/30/18 0600 07/30/18 0700 07/30/18 0810  BP:  (!) 97/58 96/62   Pulse:  (!) 120 (!) 121   Resp:      Temp: 97.6 F (36.4 C)   97.6 F (36.4 C)  TempSrc: Oral   Oral  SpO2:  94% 95%   Weight:      Height:        Intake/Output Summary (Last 24 hours) at 07/30/2018 0817 Last data filed at 07/30/2018 0600 Gross per 24 hour  Intake 487.89 ml  Output 400 ml  Net  87.89 ml   Filed Weights   07/28/18 0500 07/29/18 0500 07/30/18 0500  Weight: 92.7 kg 94.5 kg 96.4 kg    Telemetry        ? Atrial fib.   ( difficult to assess ) - Personally Reviewed  ECG       Physical Exam   GEN:  Elderly man ,  Gross anasarca   Neck: No JVD Cardiac:  Tachycardic   Respiratory: Clear to auscultation bilaterally. GI: anasarca  MS:  2+ leg edema ,  + arm and hand edema  Neuro:  minimally cooperative  Psych:   Depressed .   Labs    Chemistry Recent Labs  Lab 07/26/18 0333 07/27/18 0345  07/28/18 0323 07/28/18 1030 07/29/18 0141 07/30/18 0541  NA 133* 129*   < > 131* 131* 131* 132*  K 4.3 4.3   < > 5.5* 5.3* 4.6 4.6  CL 99 96*   < > 99 99 99 98  CO2 19* 20*   < > 18* 19* 18* 19*  GLUCOSE 133* 152*   < > 160* 146* 143* 136*  BUN 58* 64*   < > 65* 68* 73* 76*  CREATININE 2.51* 2.96*   < > 3.21* 3.42* 3.43* 3.80*  CALCIUM 8.4* 8.3*   < > 8.2* 8.4* 8.1* 7.8*  PROT 5.0* 4.7*  --  4.8*  --   --   --  ALBUMIN 2.9* 3.0*  --  2.8*  --   --   --   AST 107* 188*  --  133*  --   --   --   ALT 142* 241*  --  231*  --   --   --   ALKPHOS 360* 391*  --  454*  --   --   --   BILITOT 3.3* 4.2*  --  4.3*  --   --   --   GFRNONAA 24* 20*   < > 18* 17* 17* 15*  GFRAA 28* 23*   < > 21* 19* 19* 17*  ANIONGAP 15 13   < > 14 13 14 15    < > = values in this interval not displayed.     Hematology Recent Labs  Lab 07/28/18 0323 07/29/18 0141 07/30/18 0541  WBC 10.2 13.9* 11.1*  RBC 5.05 4.72 4.31  HGB 15.7 14.8 13.4  HCT 47.5 43.8 39.3  MCV 94.1 92.8 91.2  MCH 31.1 31.4 31.1  MCHC 33.1 33.8 34.1  RDW 15.8* 15.7* 15.6*  PLT 142* 109* 98*    Cardiac Enzymes Recent Labs  Lab 08/01/2018 2210 07/25/18 0426 07/25/18 0905 07/25/18 1525  TROPONINI 0.06* 0.08* 0.07* 0.08*   No results for input(s): TROPIPOC in the last 168 hours.   BNP Recent Labs  Lab 07/25/18 0426  BNP 1,351.7*     DDimer No results for input(s): DDIMER in the last 168  hours.   Radiology    Dg Chest Port 1 View  Result Date: 07/30/2018 CLINICAL DATA:  Abnormal respiration. History of diabetes and atrial fibrillation. EXAM: PORTABLE CHEST 1 VIEW COMPARISON:  Radiographs 07/26/2018 and 07/14/2018. FINDINGS: 0557 hours. Right IJ central venous catheter is unchanged, tip at the mid SVC level. There is stable mild cardiomegaly and aortic atherosclerosis. Layering right-greater-than-left pleural effusions, bibasilar atelectasis and mild edema are similar to recent priors. No pneumothorax or acute osseous findings. IMPRESSION: Persistent layering right-greater-than-left pleural effusions, bibasilar atelectasis and mild edema. Electronically Signed   By: Carey BullocksWilliam  Veazey M.D.   On: 07/30/2018 08:10    Cardiac Studies     Patient Profile     72 y.o. male who presents with hypotension and shock.  Echocardiogram to be done today.  Assessment & Plan    1.  Hypotension/shock:  EF 10-15%.    He has been noncompliant - does not take meds at home. Is not a candidate for any advance heart failure therapies  He said that he is ready to die and does not want to prolong his suffering   Cardiology will sign off.       For questions or updates, please contact [facility] Please consult [email or web address] for contact info under        Signed, Kristeen MissPhilip Nahser, MD  07/30/2018, 8:17 AM

## 2018-07-30 NOTE — Progress Notes (Signed)
Dilaudid drip started for comfort. Levophed and Milrinone discontinued. Heart monitor, BP cuff and oxygen saturation monitor removed per patient and family request. Wife, son and daughter-in-law at bedside. Support given.

## 2018-07-30 NOTE — Progress Notes (Addendum)
[facility] ROUNDING NOTE   Subjective:   Awake and alert still hypotensive.  Still requiring pressors patient voices that he does not wish to continue aggressive measures.  I I suggested conversation with palliative medicine sometime during the course of the day.  2D echo ejection fraction 10 to 15%.  Blood pressure 90/58 pulse 122 O2 sats 90% 3 L of oxygen  IV milrinone 0.25 IV norepinephrine 3 mcg  Bladder scan bladder scan 410 cc.  Urine output 325 cc weight increased to 96.4 kg  Sodium 132 potassium 4.6 chloride 98 CO2 19 BUN 76 creatinine 3.8 glucose 136 calcium 7.8 phosphorus 4.6 magnesium 2.3 WBC 11.1 hemoglobin 13.4 platelets 98 PSA 11.09 Co. ox panel O2 sats 83.1 HIV nonreactive RPR    nonreactive B12 3114 CPK 103   INR 2.19 PT 24  CT scan chest bilateral effusions and atelectasis in the right soft tissue edema small volume ascites 12/21 2019    Objective:  Vital signs in last 24 hours:  Temp:  [97.4 F (36.3 C)-97.7 F (36.5 C)] 97.6 F (36.4 C) (12/26 0509) Pulse Rate:  [64-128] 121 (12/26 0700) BP: (65-101)/(30-68) 96/62 (12/26 0700) SpO2:  [90 %-97 %] 95 % (12/26 0700) Weight:  [96.4 kg] 96.4 kg (12/26 0500)  Weight change: 1.9 kg Filed Weights   07/28/18 0500 07/29/18 0500 07/30/18 0500  Weight: 92.7 kg 94.5 kg 96.4 kg    Intake/Output: I/O last 3 completed shifts: In: 1016.8 [P.O.:60; I.V.:956.8] Out: 725 [Urine:725]   Intake/Output this shift:  No intake/output data recorded.   Awake alert follows commands CVS-tachycardic 3/6 systolic murmur RS-lung fields essentially clear with no wheezes or rales ABD-General anasarca EXT-3+ edema   Basic Metabolic Panel: Recent Labs  Lab 07/26/18 0333 07/27/18 0345  07/27/18 1808 07/28/18 0323 07/28/18 1030 07/29/18 0141 07/30/18 0541  NA 133* 129*   < > 141 131* 131* 131* 132*  K 4.3 4.3   < > 2.0* 5.5* 5.3* 4.6 4.6  CL 99 96*   < > 123* 99 99 99 98  CO2 19* 20*   < > 12* 18* 19* 18*  19*  GLUCOSE 133* 152*   < > 113* 160* 146* 143* 136*  BUN 58* 64*   < > 40* 65* 68* 73* 76*  CREATININE 2.51* 2.96*   < > 1.38* 3.21* 3.42* 3.43* 3.80*  CALCIUM 8.4* 8.3*   < > 4.0* 8.2* 8.4* 8.1* 7.8*  MG 2.5* 2.7*  --   --  2.4  --  2.5* 2.3  PHOS  --  4.7*  --   --  4.7*  --  4.0 4.6   < > = values in this interval not displayed.    Liver Function Tests: Recent Labs  Lab 07/30/2018 2210 07/25/18 0426 07/26/18 0333 07/27/18 0345 07/28/18 0323  AST 44* 33 107* 188* 133*  ALT QUANTITY NOT SUFFICIENT, UNABLE TO PERFORM TEST 98* 142* 241* 231*  ALKPHOS 271* 263* 360* 391* 454*  BILITOT 2.9* 2.5* 3.3* 4.2* 4.3*  PROT 5.7* 5.2* 5.0* 4.7* 4.8*  ALBUMIN 3.4* 3.0* 2.9* 3.0* 2.8*   No results for input(s): LIPASE, AMYLASE in the last 168 hours. No results for input(s): AMMONIA in the last 168 hours.  CBC: Recent Labs  Lab 07-30-18 2210 07/25/18 0426 07/26/18 0333 07/27/18 0345 07/28/18 0323 07/29/18 0141 07/30/18 0541  WBC 9.7 8.5 9.6 11.0* 10.2 13.9* 11.1*  NEUTROABS 8.0* 6.7  --   --   --   --  9.8*  HGB 15.5 14.7 15.3 15.1 15.7 14.8 13.4  HCT 48.0 43.8 46.2 46.1 47.5 43.8 39.3  MCV 96.2 94.4 94.5 94.5 94.1 92.8 91.2  PLT 141* 145* 150 143* 142* 109* 98*    Cardiac Enzymes: Recent Labs  Lab 07/16/2018 2210 07/25/18 0426 07/25/18 0905 07/25/18 1525 07/28/18 1030  CKTOTAL  --   --   --   --  103  TROPONINI 0.06* 0.08* 0.07* 0.08*  --     BNP: Invalid input(s): POCBNP  CBG: Recent Labs  Lab 07/29/18 1237 07/29/18 1559 07/29/18 2122 07/29/18 2350 07/30/18 0616  GLUCAP 138* 130* 116* 116* 139*    Microbiology: Results for orders placed or performed during the hospital encounter of 07/23/2018  MRSA PCR Screening     Status: None   Collection Time: 07/26/18  6:04 AM  Result Value Ref Range Status   MRSA by PCR NEGATIVE NEGATIVE Final    Comment:        The GeneXpert MRSA Assay (FDA approved for NASAL specimens only), is one component of a comprehensive  MRSA colonization surveillance program. It is not intended to diagnose MRSA infection nor to guide or monitor treatment for MRSA infections. Performed at [facility], [address] [place], [place] [address]     Coagulation Studies: Recent Labs    07/28/18 1030  LABPROT 24.0*  INR 2.19    Urinalysis: No results for input(s): COLORURINE, LABSPEC, PHURINE, GLUCOSEU, HGBUR, BILIRUBINUR, KETONESUR, PROTEINUR, UROBILINOGEN, NITRITE, LEUKOCYTESUR in the last 72 hours.  Invalid input(s): APPERANCEUR    Imaging: No results found.   Medications:   . sodium chloride 10 mL/hr at 07/29/18 2000  . milrinone 0.25 mcg/kg/min (07/29/18 2000)  . norepinephrine (LEVOPHED) Adult infusion 3 mcg/min (07/29/18 2000)   . amiodarone  200 mg Oral Daily  . aspirin  81 mg Oral Daily  . Chlorhexidine Gluconate Cloth  6 each Topical Daily  . heparin  5,000 Units Subcutaneous Q8H  . insulin aspart  1-3 Units Subcutaneous Q4H  . sodium chloride flush  10-40 mL Intracatheter Q12H  . THROMBI-PAD  1 each Topical Once   sodium chloride, acetaminophen **OR** acetaminophen, sodium chloride flush  Assessment/ Plan:   Acute kidney injury with systemic hypotension.  Bland urine sediment no evidence of nephrotic syndrome no evidence of glomerular disease no evidence insertion nephritis.  Most likely has ischemic acute tubular necrosis.  Avoid ACE inhibitor, ARB nonsteroidal anti-inflammatories, Cox 2 inhibitors, IV contrast.  CPK 103 not elevated.  Statin has been discontinued.  PT/INR increased suggests hepatic dysfunction.  Hepatorenal syndrome would also be in differential.  Urine sodium pending.  We will continue to follow.  Hypotension.  2D echo demonstrates ejection fraction 10 to 15%.  Also appears to have increased INR/PT suggestive of hepatic dysfunction.  Now requiring pressors and milrinone.  Sinus tachycardia noted.  Hyperkalemia corrected will discontinue Lokelma  Hyponatremia  secondary to hypervolemic state with congestive heart failure and systolic dysfunction and hepatic failure  Lethargy somewhat improved RPR, HIV, B12 all negative PSA 11 would be suggestive of a prostate malignancy.  Atrial tachyarrhythmia started on amiodarone 200 mg daily  Disposition patient appreciate help from Ms. Yong ChannelAlicia Parker for palliative medicine.  It appears that patient is ready to discontinue aggressive therapy at this time   LOS: 5 Garnetta BuddyMartin W Ellisha Bankson @TODAY @7 :53 AM

## 2018-07-31 ENCOUNTER — Telehealth: Payer: Self-pay | Admitting: Internal Medicine

## 2018-08-05 NOTE — Progress Notes (Signed)
Palliative Medicine RN Note: Rec'd a call from pt's RN asking about holding transfer to 6N for the evening, as moving patients in the middle of the night can be distressing for patients and families, especially when they are comfort care only. PMT is ok with holding transfer until tomorrow as long as attending ok's holding [name] in ICU.  Margret Chance Kier Smead, RN, BSN, [facility] Palliative Medicine Team 21-Aug-2018 7:28 AM Office [phone]

## 2018-08-05 NOTE — Progress Notes (Signed)
Late Entry  While assessing patient this AM, RN noted patient to have agonal breathing and without pulses. Breathing continued to slow down and then stop while RN was in the room. Absent heart sounds auscultated for 1 minute by Hortense Ramal, RN and Gaspar Garbe, RN. Asystole ekg strip printed and placed in chart. Patient's spouse Okey Regal and son Thayer Ohm notified. Funeral home information documented on flow sheet. Eye prep completed. CDS notified by E-link RN. Patient to be transported to the morgue.  Leanna Battles, RN

## 2018-08-05 NOTE — Progress Notes (Signed)
[facility] Brief/Social Note:   Patient not seen.  Noted transition to comfort care with palliative following closely.  Appreciate unit's support of the family.    Please do not hesitate to contact me if I can be of assistance.    Vallery Sa, MD

## 2018-08-05 NOTE — Telephone Encounter (Signed)
Received death certificate (faxed copy) from [facility] [place] Docker, will send via courier on Monday AM to Dr. Kendrick Fries to sign due to Dr. Marchelle Gearing & Dr. Molli Knock are both on vacation next week 08/15/18  LM

## 2018-08-05 NOTE — Discharge Summary (Signed)
DISCHARGE SUMMARY    Date of admit: 08-03-2018  8:28 PM Date of discharge: No discharge date for patient encounter. Length of Stay: 6 days  PCP is Sasser, Clarene Critchley, MD   PROBLEM LIST Principal Problem:   Cardiogenic Shock - Acute on chronic systolic chf Suspected Prostate Cancer Active Problems:   Nonischemic dilated cardiomyopathy (HCC)   SYSTOLIC HEART FAILURE, CHRONIC   Near syncope   Anasarca   AKI (acute kidney injury) (HCC)   Nausea & vomiting   Elevated LFTs   Pressure injury of skin   Shock circulatory (HCC)   Cardiogenic shock (HCC)   Goals of care, counseling/discussion   Palliative care encounter    SUMMARY IYAD MCVOY was 73 y.o. patient with    has a past medical history of Atrial fibrillation (HCC), CAD (coronary artery disease), Chronic systolic heart failure (HCC), Deafness in right ear, Essential hypertension, Hyperlipidemia, Infection involving implantable cardioverter-defibrillator (ICD) (HCC), LBBB (left bundle branch block), Nonischemic cardiomyopathy (HCC), Pneumonia, and Type 2 diabetes mellitus (HCC).   has a past surgical history that includes Hernia repair; Fiscula Repair; Hearing Surgery; Cholecystectomy; Cardiac defibrillator placement (12/14/2007; 11/23/13); Cataract extraction w/PHACO (Right, 06/14/2013); Cataract extraction w/PHACO (Left, 07/08/2013); implantable cardioverter defibrillator generator change (N/A, 11/23/2013); Appendectomy; Cardiac defibrillator removal (06/04/2016); Icd lead removal (N/A, 06/04/2016); and TEE without cardioversion (N/A, 06/04/2016).   Admitted on Aug 03, 2018 with   73 year old man with nonischemic cardiomyopathy, HFrEF (EF 40-45% on 09/04/2016), ICD removed in 2017 2/2 infection, paroxysmal atrial fibrillation not on anticoag, HTN, DM2 admitted on 08/03/2018 with N/V x 1 week and falls x 2. Was found to be hypotensive which responded to IV fluids. On morning of 12/22 was found to be hypotensive again and  had received 3L of IV fluids and 12.5g albumin IV. Patient was transferred to ICU and started on levo gtt. Social : Not seen MD in 2 years - refuses.Does not take meds at home  Significant Hospital Events   12/20 admission 12/21 - TSH 3.7, cortisol 22.9, CT  Head - no acute findings, small vessel changes, UA - dipstick - neg for protein, [place] - no cirrhosis/normal spleen 12/22 transfer to ICU 12/22 - Reports lower back pain and some dyspnea. Also reports feeling drowsy. No new complaints otherwise.\ 12/23 - Hypoonatremia, Oliguric overnight .On amio gtt and levophed gtt. Lasix given overnight and AKI worse. 12/24 -. PSA 11 and suspected prostated cancer. EF 10%. Start milrinone 12/25 -  Currently on Levophed and milrinone but is tachycardic at 122. Palliative care consult - conflicting messages from patient - full code but refusing 12/26 - repeatedly stated he was ready to die and wanted end to all medical Rx and focus his care on comfort. Palliative measures started 07/12/2018 - expired   SIGNED Dr. Kalman Shan, M.D., [facility].C.P Pulmonary and Critical Care Medicine Staff Physician [facility] Pager: [phone], If no answer or between  15:00h - 7:00h: call [phone]  07/10/2018 9:06 AM

## 2018-08-05 NOTE — Social Work (Signed)
CSW acknowledging consult for comfort care. Will follow for disposition should hospice services or residential hospice become appropriate.   Jaydn Fincher H Makenize Messman, LCSWA [facility] Social Work [phone]   

## 2018-08-05 NOTE — Progress Notes (Signed)
[facility] notified of pt's death. Referral number [id number]. Pending coordinator notification for determination of tissue donation.

## 2018-08-05 DEATH — deceased

## 2018-08-07 ENCOUNTER — Telehealth: Payer: Self-pay

## 2018-08-07 NOTE — Telephone Encounter (Signed)
On 08/07/2018 I received the dc back from Doctor McQuaid. I got the dc ready and called the funeral home to let them know the dc is ready for pickup. I also faxed a copy to the funeral home per the funeral home request.

## 2019-06-20 IMAGING — CT CT ABD-PELV W/O CM
2 of 4 series · 16 of 46 positions shown, 18 images · non-contrast
Comparison: None

CLINICAL DATA: Hypotensive episode, near syncopal episode, nausea,
vomiting, elevated LFTs, history atrial fibrillation, coronary
artery disease, non ischemic cardiomyopathy, type II diabetes
mellitus, arrhythmia., chronic systolic heart failure

EXAM:
CT ABDOMEN AND PELVIS WITHOUT CONTRAST
TECHNIQUE: Multidetector CT imaging of the abdomen and pelvis was performed
following the standard protocol without IV contrast. Sagittal and
coronal MPR images reconstructed from axial data set.

[Series 1: ap without · axial · non-contrast · 0.80mm/px · z∈[-300,+110]mm · 13 of 94 slices shown, 15 images]
[im 6/94  soft-tissue]
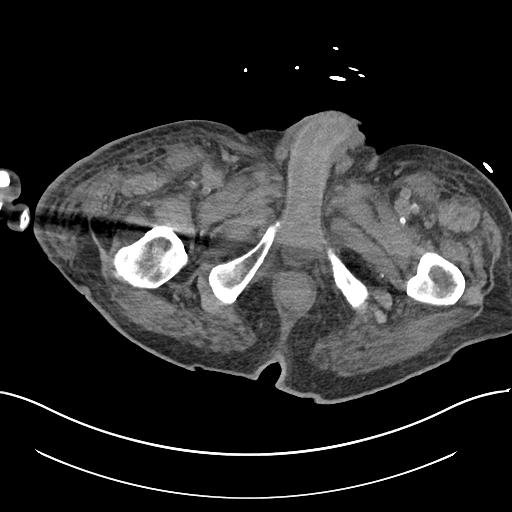
[im 6/94  bone]
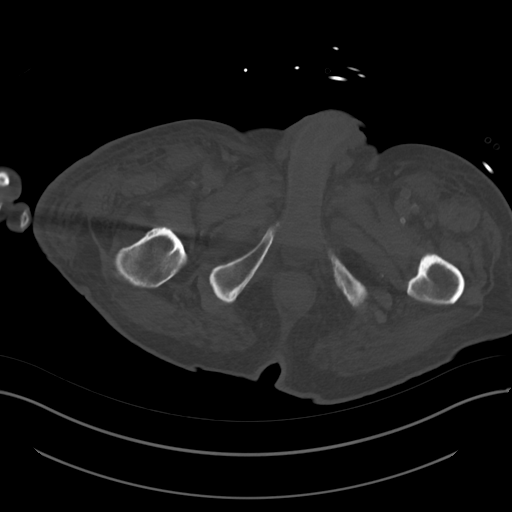
[im 11/94  soft-tissue]
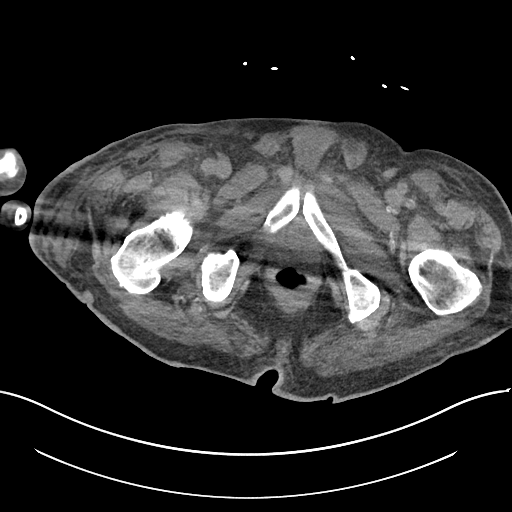
[im 22/94  soft-tissue]
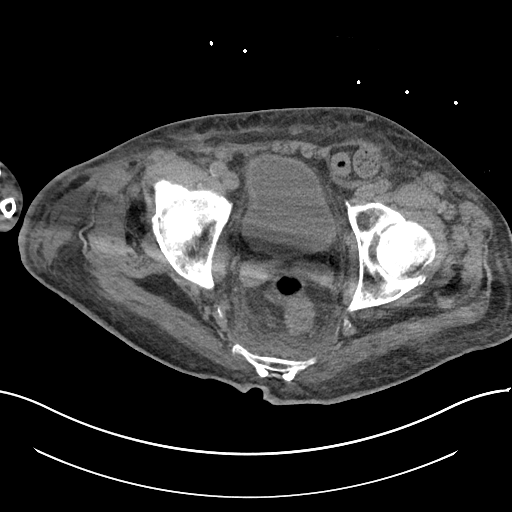
[im 28/94  soft-tissue]
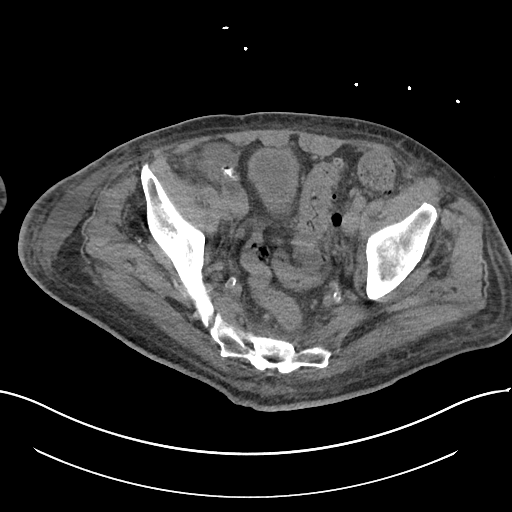
[im 33/94  soft-tissue]
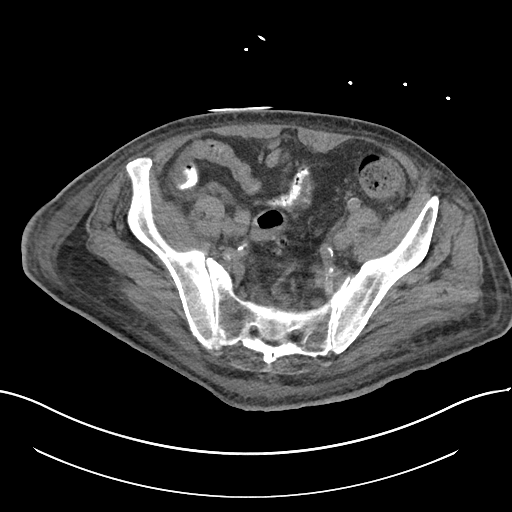
[im 39/94  soft-tissue]
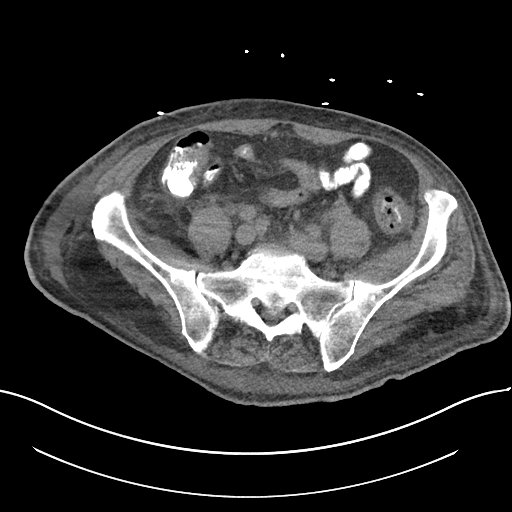
[im 50/94  soft-tissue]
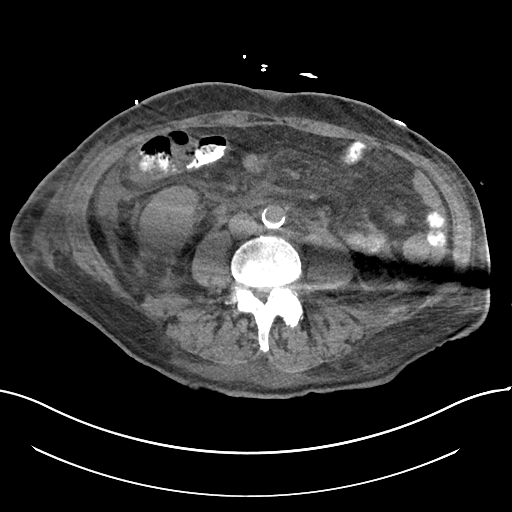
[im 55/94  soft-tissue]
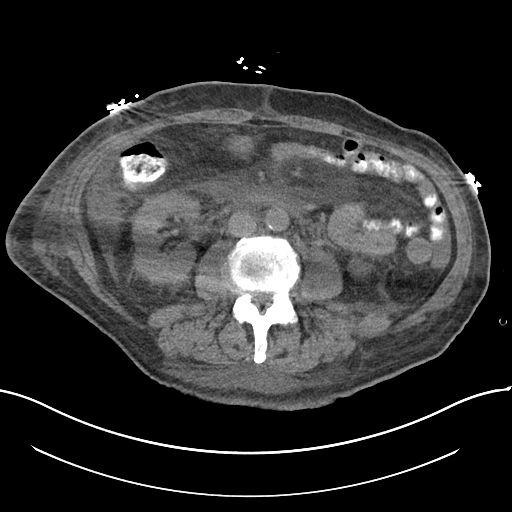
[im 61/94  soft-tissue]
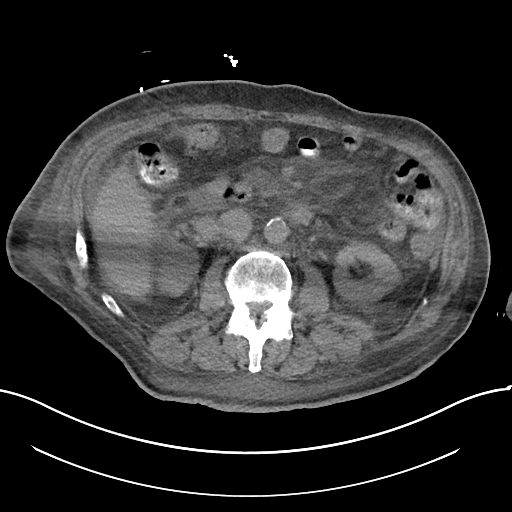
[im 61/94  bone]
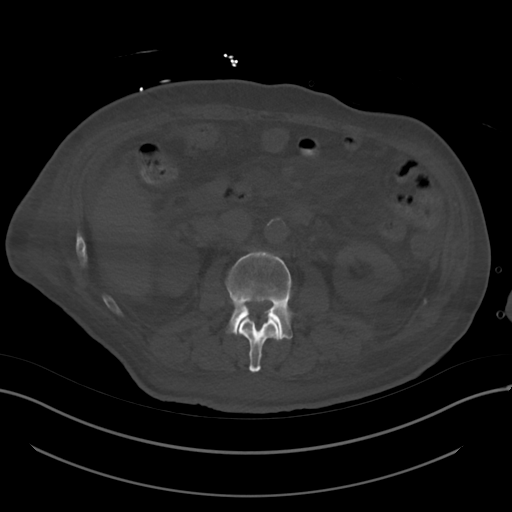
[im 66/94  soft-tissue]
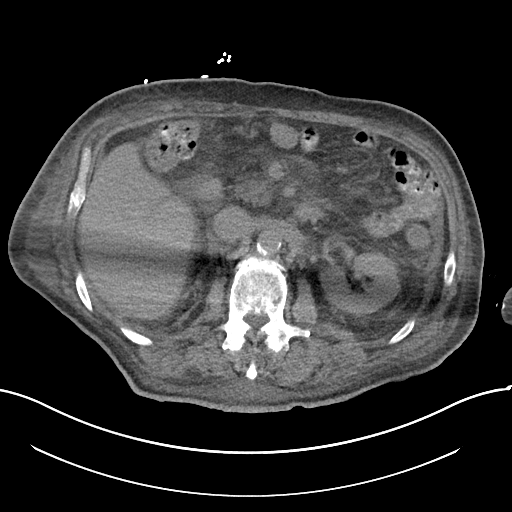
[im 72/94  soft-tissue]
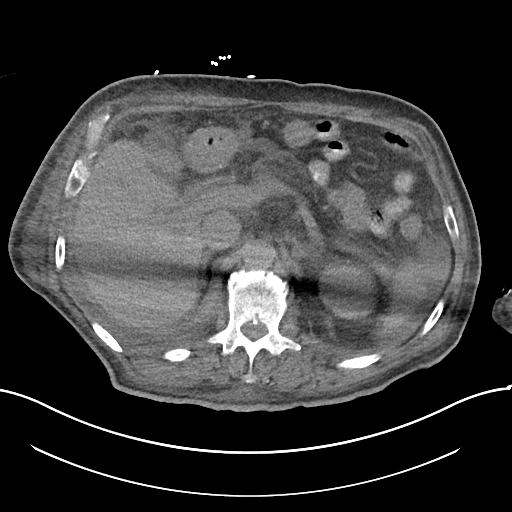
[im 83/94  soft-tissue]
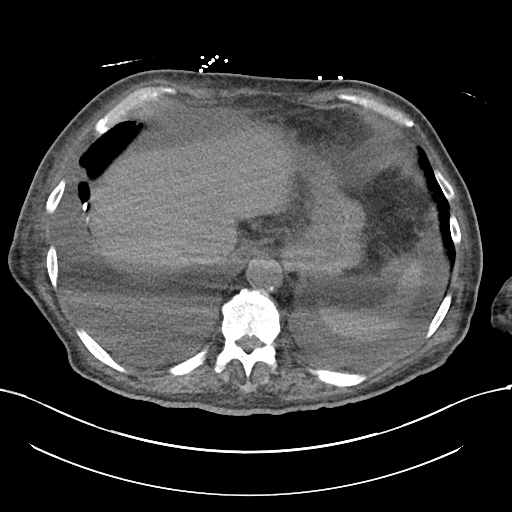
[im 88/94  soft-tissue]
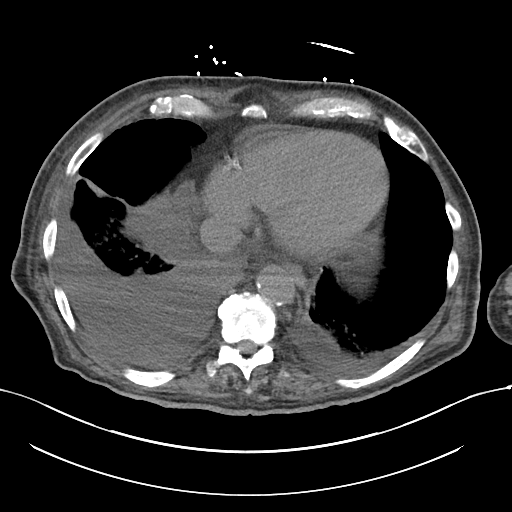

[Series 8: cor · coronal · 0.89mm/px · 3 of 83 slices shown]
[im 28/83  soft-tissue]
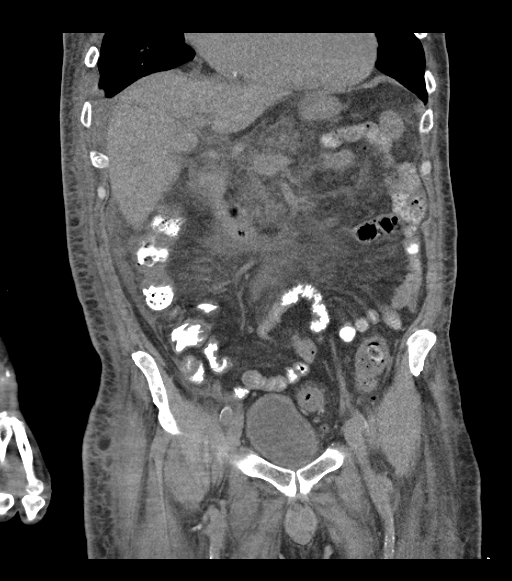
[im 37/83  soft-tissue]
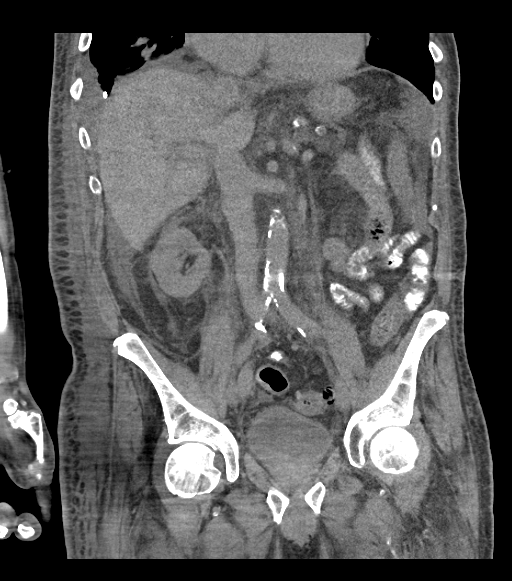
[im 46/83  soft-tissue]
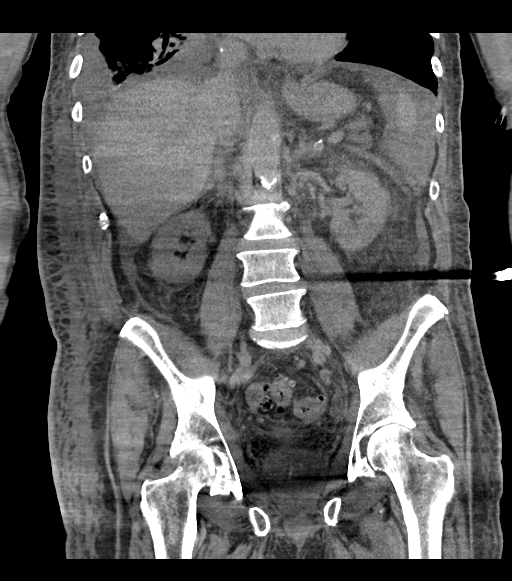

[16 of 46 positions shown; findings below may reference images not displayed]

FINDINGS: Lower chest: Bibasilar pleural effusions and compressive atelectasis
greater on RIGHT. Enlargement of heart. Coronary arterial
calcifications. Minimal pericardial effusion.

Hepatobiliary: Gallbladder surgically absent. Liver small,
unremarkable.

Pancreas: Atrophic, unremarkable

Spleen: Normal appearance

Adrenals/Urinary Tract: Adrenal glands normal appearance. No gross
renal mass or hydronephrosis. Bladder unremarkable.

Stomach/Bowel: Appendix surgically absent by history. Extension of a
non obstructed segment of sigmoid colon into a LEFT inguinal hernia.
Sigmoid diverticulosis. Long segment of questionable wall thickening
versus artifact from underdistention at sigmoid colon, without
adjacent focal inflammatory changes, can not exclude segment of
colitis. Remaining bowel loops unremarkable. Stomach decompressed.

Vascular/Lymphatic: Atherosclerotic calcifications aorta, iliac
arteries, visceral branch vessels, coronary arteries. No definite
adenopathy.

Reproductive: Prostatic enlargement, gland measuring 5.8 x 5.5 by
question 4.1 cm.

Other: Diffuse soft tissue edema within body wall and within
abdominal and retroperitoneal tissue planes. Small volume ascites.
No free air. No hernia.

Musculoskeletal: Unremarkable
IMPRESSION: Bibasilar effusions and atelectasis greater on RIGHT.

Diffuse soft tissue edema.

Small volume ascites.

Nonobstructed segment of sigmoid colon within a LEFT inguinal
hernia.

Sigmoid diverticulosis with questionable wall thickening versus
artifact at sigmoid colon cannot exclude segment of sigmoid colitis.

Prostatic enlargement.

## 2020-06-26 IMAGING — US US ABDOMEN COMPLETE
1 series · 14 of 25 positions shown · non-contrast
Comparison: 07/25/2018 CT without contrast

CLINICAL DATA: Elevated LFTs, previous cholecystectomy, diabetes
and hypertension

EXAM:
ABDOMEN ULTRASOUND COMPLETE

[Series 1: us abdomen complete · 0.33mm/px · 14 of 81 slices shown]
[im 1/81]
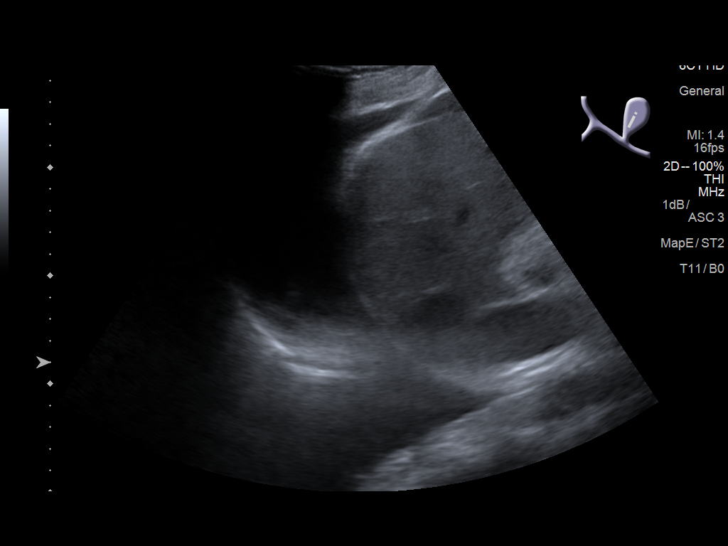
[im 7/81]
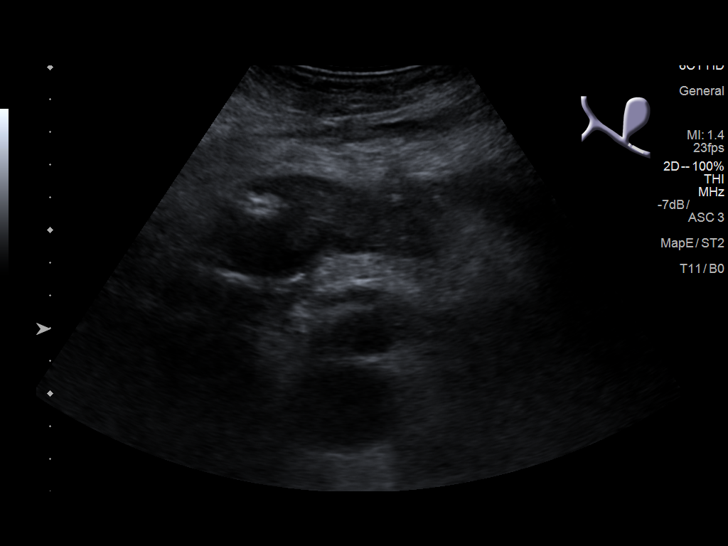
[im 14/81]
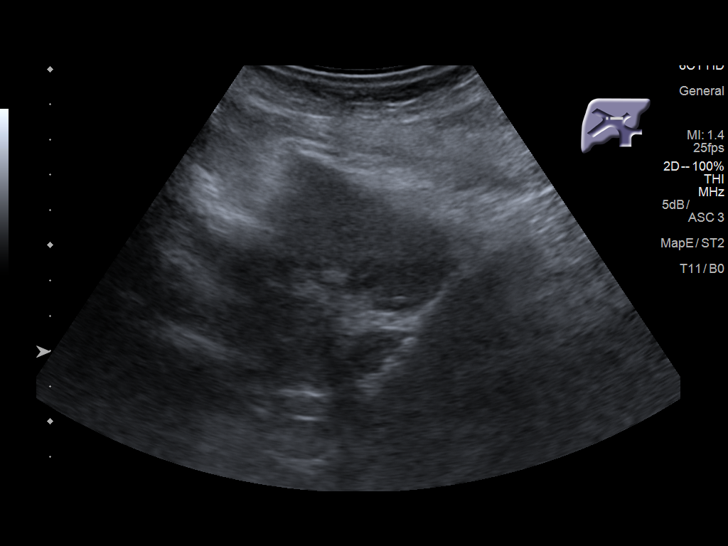
[im 21/81]
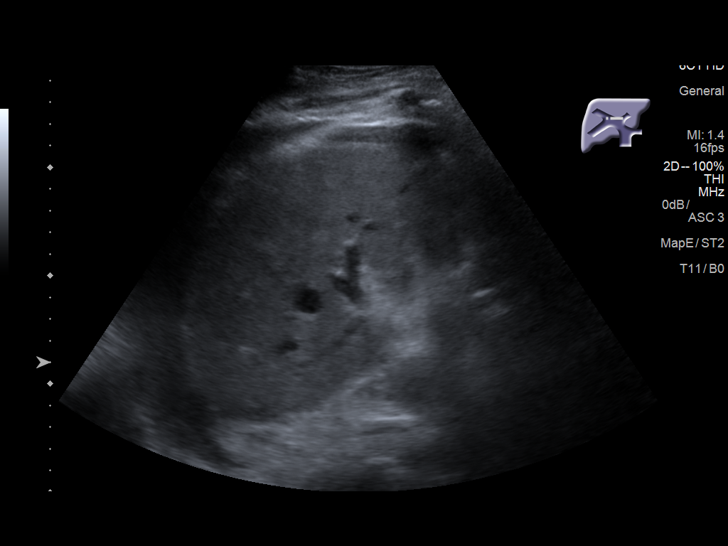
[im 27/81]
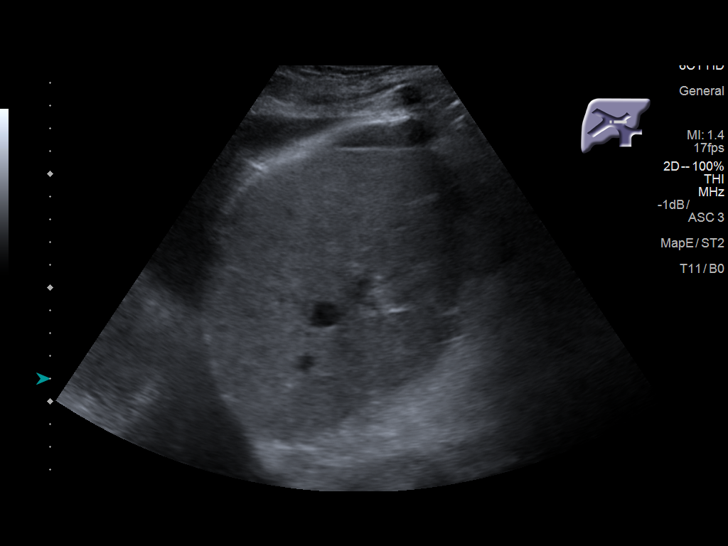
[im 31/81]
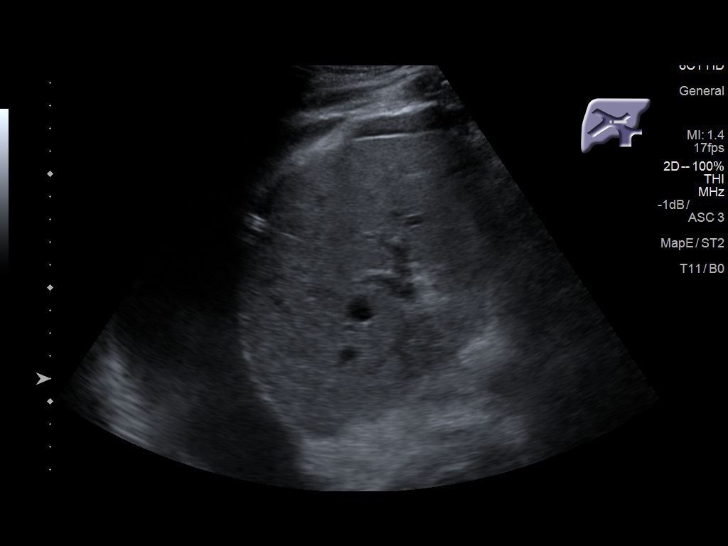
[im 37/81]
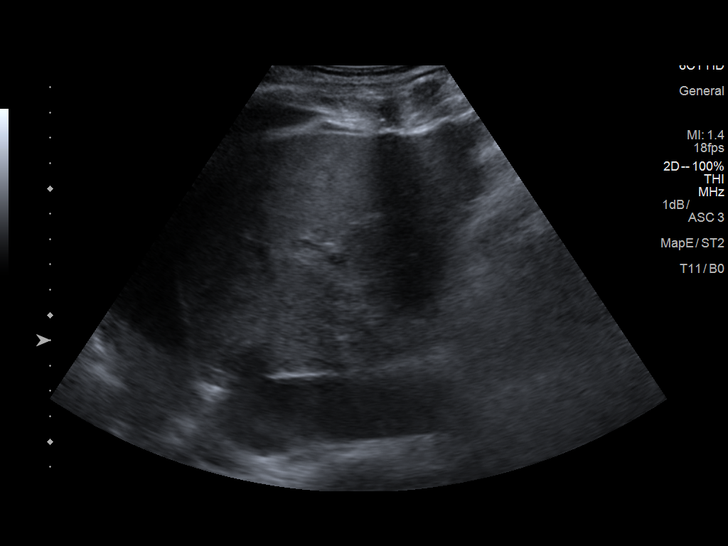
[im 44/81]
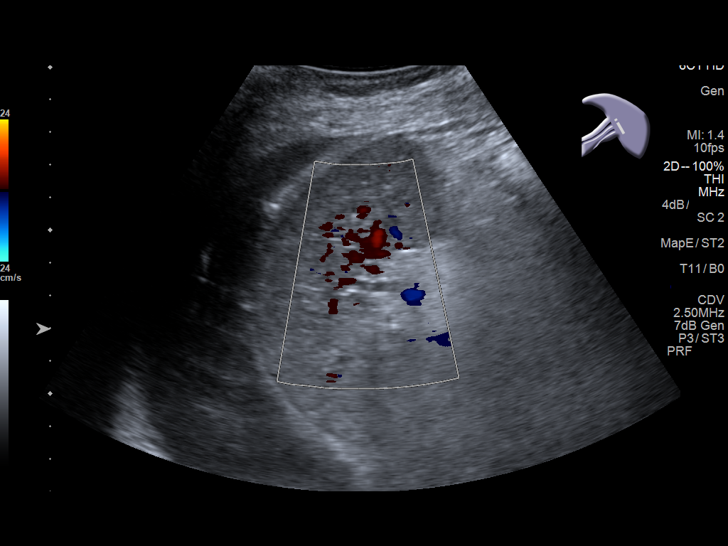
[im 51/81]
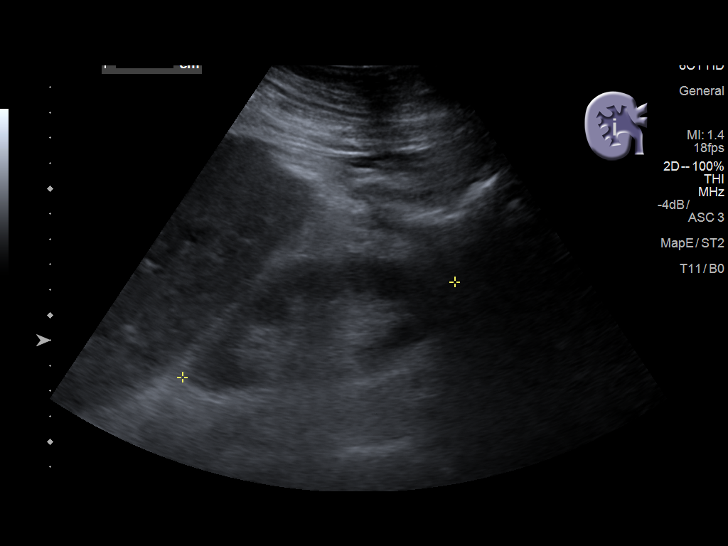
[im 54/81]
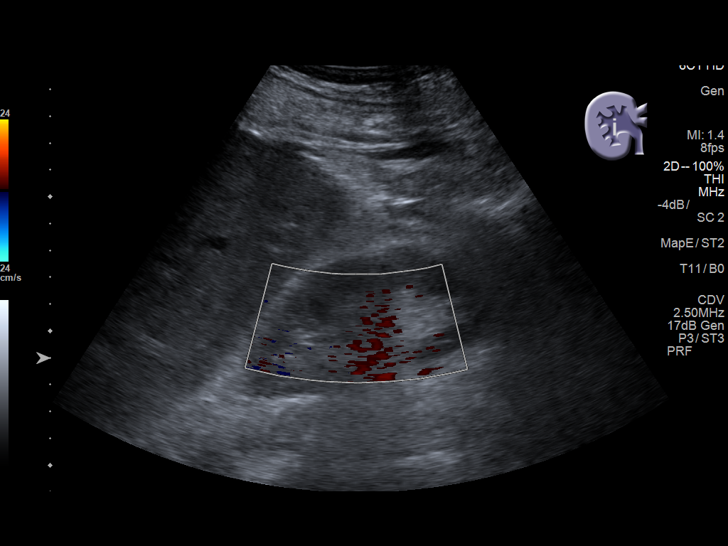
[im 61/81]
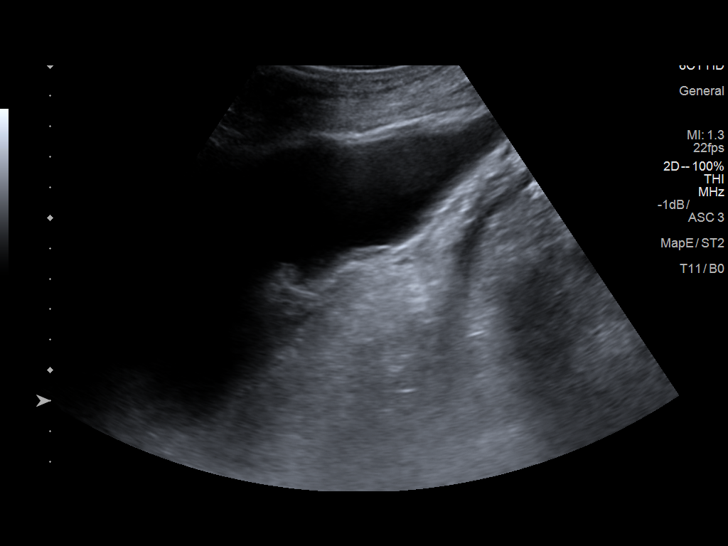
[im 67/81]
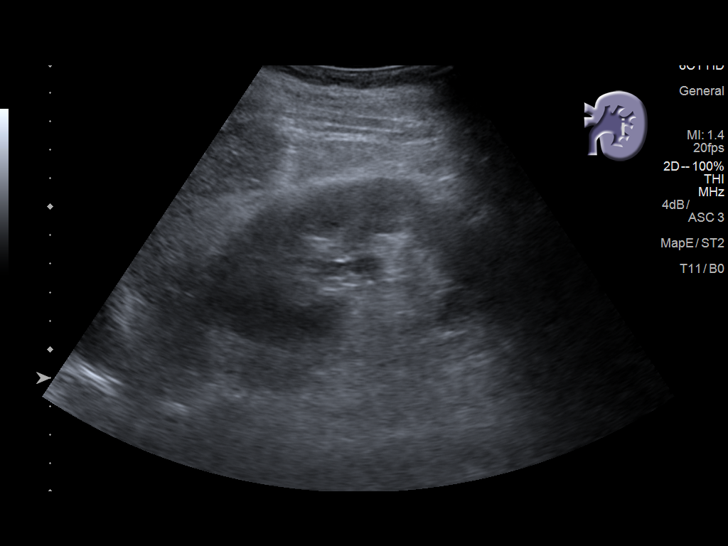
[im 74/81]
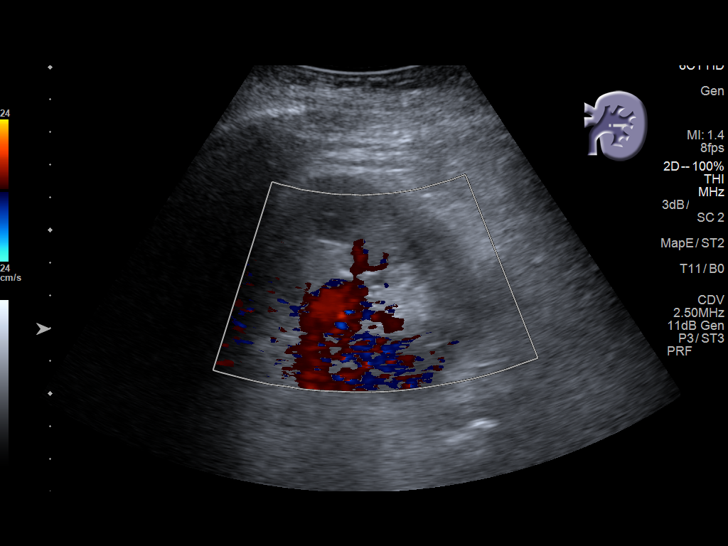
[im 81/81]
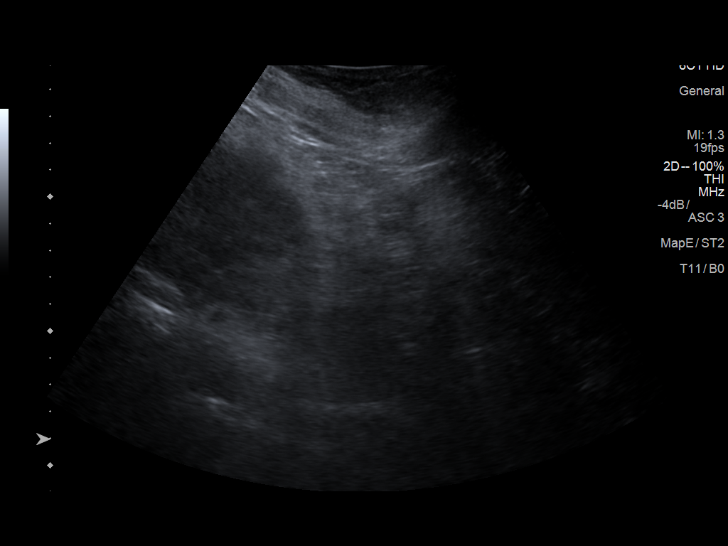

[14 of 25 positions shown; findings below may reference images not displayed]

FINDINGS: Gallbladder: Surgically absent

Common bile duct: Diameter: 4.8 mm

Liver: No focal lesion identified. Within normal limits in
parenchymal echogenicity. Portal vein is patent on color Doppler
imaging with normal direction of blood flow towards the liver.

IVC: No abnormality visualized.

Pancreas: Visualized portion unremarkable.

Spleen: 6.6 cm.  Normal in size.  Splenic granulomata noted.

Right Kidney: Length: 11.4 cm. Echogenicity within normal limits. No
mass or hydronephrosis visualized.

Left Kidney: Length: 10.3 cm. Echogenicity within normal limits. No
mass or hydronephrosis visualized.

Abdominal aorta: Aortic atherosclerosis.  No significant aneurysm.

Other findings: Small amount of abdominal ascites. Pleural effusions
present bilaterally.
IMPRESSION: No acute finding by abdominal ultrasound

Remote cholecystectomy

Aortic atherosclerosis without aneurysm

Small amount of abdominal ascites and bilateral pleural effusions
